# Patient Record
Sex: Female | Born: 1935
Health system: Southern US, Community
[De-identification: ages and names within clinical notes are randomized; demographics above are authoritative.]

## PROBLEM LIST (undated history)

## (undated) DIAGNOSIS — Z974 Presence of external hearing-aid: Secondary | ICD-10-CM

## (undated) DIAGNOSIS — J019 Acute sinusitis, unspecified: Secondary | ICD-10-CM

## (undated) DIAGNOSIS — M1990 Unspecified osteoarthritis, unspecified site: Secondary | ICD-10-CM

## (undated) DIAGNOSIS — I10 Essential (primary) hypertension: Secondary | ICD-10-CM

## (undated) DIAGNOSIS — H9190 Unspecified hearing loss, unspecified ear: Secondary | ICD-10-CM

## (undated) DIAGNOSIS — Z973 Presence of spectacles and contact lenses: Secondary | ICD-10-CM

## (undated) DIAGNOSIS — C50919 Malignant neoplasm of unspecified site of unspecified female breast: Secondary | ICD-10-CM

## (undated) HISTORY — PX: TONSILLECTOMY AND ADENOIDECTOMY: SHX28

## (undated) HISTORY — PX: COLONOSCOPY: SHX174

## (undated) HISTORY — DX: Acute sinusitis, unspecified: J01.90

## (undated) HISTORY — DX: Essential (primary) hypertension: I10

## (undated) HISTORY — DX: Malignant neoplasm of unspecified site of unspecified female breast: C50.919

## (undated) HISTORY — PX: HERNIA REPAIR: SHX51

---

## 2004-03-02 HISTORY — PX: APPENDECTOMY: SHX54

## 2010-03-02 HISTORY — PX: BREAST BIOPSY: SHX20

## 2011-03-03 HISTORY — PX: MASTECTOMY: SHX3

## 2011-12-07 LAB — HM MAMMOGRAPHY

## 2012-12-15 ENCOUNTER — Other Ambulatory Visit: Payer: Self-pay

## 2012-12-15 DIAGNOSIS — Z1231 Encounter for screening mammogram for malignant neoplasm of breast: Secondary | ICD-10-CM

## 2013-01-03 ENCOUNTER — Ambulatory Visit: Payer: Self-pay

## 2013-01-19 ENCOUNTER — Ambulatory Visit
Admission: RE | Admit: 2013-01-19 | Discharge: 2013-01-19 | Disposition: A | Discharge status: Home / Self Care | Payer: Medicare Other | Source: Ambulatory Visit

## 2013-01-19 DIAGNOSIS — Z1231 Encounter for screening mammogram for malignant neoplasm of breast: Secondary | ICD-10-CM

## 2013-01-19 LAB — HM MAMMOGRAPHY

## 2013-03-03 ENCOUNTER — Encounter: Payer: Self-pay | Admitting: Internal Medicine

## 2013-03-03 ENCOUNTER — Ambulatory Visit (INDEPENDENT_AMBULATORY_CARE_PROVIDER_SITE_OTHER): Payer: Medicare PPO | Admitting: Internal Medicine

## 2013-03-03 VITALS — BP 152/86 | Temp 98.5°F | Ht 62.5 in | Wt 167.0 lb

## 2013-03-03 DIAGNOSIS — I10 Essential (primary) hypertension: Secondary | ICD-10-CM

## 2013-03-03 DIAGNOSIS — C50919 Malignant neoplasm of unspecified site of unspecified female breast: Secondary | ICD-10-CM | POA: Insufficient documentation

## 2013-03-03 DIAGNOSIS — R252 Cramp and spasm: Secondary | ICD-10-CM

## 2013-03-03 DIAGNOSIS — Z9889 Other specified postprocedural states: Secondary | ICD-10-CM

## 2013-03-03 DIAGNOSIS — Z974 Presence of external hearing-aid: Secondary | ICD-10-CM | POA: Insufficient documentation

## 2013-03-03 DIAGNOSIS — C50912 Malignant neoplasm of unspecified site of left female breast: Secondary | ICD-10-CM

## 2013-03-03 DIAGNOSIS — Z853 Personal history of malignant neoplasm of breast: Secondary | ICD-10-CM

## 2013-03-03 DIAGNOSIS — Z2821 Immunization not carried out because of patient refusal: Secondary | ICD-10-CM

## 2013-03-03 LAB — BASIC METABOLIC PANEL
BUN: 24 mg/dL — ABNORMAL HIGH (ref 6–23)
CHLORIDE: 104 meq/L (ref 96–112)
CO2: 27 meq/L (ref 19–32)
Calcium: 9.7 mg/dL (ref 8.4–10.5)
Creatinine, Ser: 1 mg/dL (ref 0.4–1.2)
GFR: 53.88 mL/min — ABNORMAL LOW (ref >60.00)
Glucose, Bld: 109 mg/dL — ABNORMAL HIGH (ref 70–99)
Potassium: 5 mEq/L (ref 3.5–5.1)
SODIUM: 139 meq/L (ref 135–145)

## 2013-03-03 LAB — CBC WITH DIFFERENTIAL/PLATELET
Basophils Absolute: 0.1 10*3/uL (ref 0.0–0.1)
Basophils Relative: 0.5 % (ref 0.0–3.0)
Eosinophils Absolute: 0.5 10*3/uL (ref 0.0–0.7)
Eosinophils Relative: 4.6 % (ref 0.0–5.0)
HCT: 42.6 % (ref 36.0–46.0)
HEMOGLOBIN: 14.4 g/dL (ref 12.0–15.0)
LYMPHS PCT: 20.3 % (ref 12.0–46.0)
Lymphs Abs: 2.1 10*3/uL (ref 0.7–4.0)
MCHC: 33.8 g/dL (ref 30.0–36.0)
MCV: 96.9 fl (ref 78.0–100.0)
Monocytes Absolute: 0.9 10*3/uL (ref 0.1–1.0)
Monocytes Relative: 8.2 % (ref 3.0–12.0)
NEUTROS ABS: 6.9 10*3/uL (ref 1.4–7.7)
Neutrophils Relative %: 66.4 % (ref 43.0–77.0)
Platelets: 206 10*3/uL (ref 150.0–400.0)
RBC: 4.4 Mil/uL (ref 3.87–5.11)
RDW: 13 % (ref 11.5–14.6)
WBC: 10.4 10*3/uL (ref 4.5–10.5)

## 2013-03-03 LAB — T4, FREE: Free T4: 0.79 ng/dL (ref 0.60–1.60)

## 2013-03-03 LAB — MAGNESIUM: MAGNESIUM: 2.4 mg/dL (ref 1.5–2.5)

## 2013-03-03 LAB — TSH: TSH: 0.27 u[IU]/mL — AB (ref 0.35–5.50)

## 2013-03-03 NOTE — Progress Notes (Signed)
Pre visit review using our clinic review tool, if applicable. No additional management support is needed unless otherwise documented below in the visit note. Chief Complaint  Patient presents with  . Establish Care    HPI:  Patient comes in as new patient visit  Here with husband .[name] Previous care was  In [place] when they moved ; Moved from [facility]     september   .   2014. Lived [place] previous,ly for years.  Here with adopted daughter   .  Medical issues :  HT :30 year years  On atenolol Medication   for years and lisinopril .more recnetly  No recent check .Last blood tests .  Every 3 months  Had blood tests. In [place] last ? 6 month ago .   Used to exercise  curves ;not since moved.   Using a vitamin.   Felt to be good for immunity and cancer    No planned fu for her breast cancer  Known and no meds   Had left mastectomy .    Has has neg mammo gram.  Right .    New issues :cramps in legs and can last up to 30 minutes    And rushing  felling and lightheaded  At times. Better with exercise  Sometimes at night  No claudication no diuretics    One had cancer  Lung  Tobacco and now deceased twois sister had ;lumpectomy .    Mom died from cva and heart failure .     Adopted daughter.  barbara ROS:  GEN/ HEENT: No fever, significant weight changes sweats headaches vision problems has hearing aids  And hx of wax in ear  But doesn't do wll with drops or irrigation CV/ PULM; No chest pain shortness of breath cough, syncope,edema  change in exercise tolerance. GI /GU: No adominal pain, vomiting, change in bowel habits. No blood in the stool. No significant GU symptoms. SKIN/HEME: ,no acute skin rashes suspicious lesions or bleeding. No lymphadenopathy, nodules, masses.  NEURO/ PSYCH:  No neurologic signs such as weakness numbness. No depression anxiety. IMM/ Allergy: No unusual infections.  Allergy .   REST of 12 system review negative except as per HPI   Past Medical History  Diagnosis  Date  . Breast cancer     mastectomy 3   neg ln     Family History  Problem Relation Age of Onset  . Hypertension Mother   . Stroke Mother   . Heart failure Mother   . Cancer Sister     lung     History   Social History  . Marital Status: Married    Spouse Name: N/A    Number of Children: N/A  . Years of Education: N/A   Social History Main Topics  . Smoking status: Never Smoker   . Smokeless tobacco: None  . Alcohol Use: Yes     Comment: moderate  . Drug Use: None  . Sexual Activity: None   Other Topics Concern  . None   Social History Narrative   Usually receives 5 hours of sleep per night   3 people living in the home   [place]uppy  [place]   From [place] moved from [place]  Adopted daughter nearby.      Neg ets  Wine with meals  No tob rd.   G0P0   Married husband [place] background  Herself [place] descent.  Outpatient Encounter Prescriptions as of 03/03/2013  Medication Sig  . atenolol (TENORMIN) 50 MG tablet Take 50 mg by mouth daily.  [name] CALCIUM-MAG-VIT C-VIT D PO Take by mouth daily.  . Coenzyme Q10 (CO Q 10 PO) Take 300 mg by mouth 3 (three) times daily.  [name] lisinopril (PRINIVIL,ZESTRIL) 40 MG tablet Take 40 mg by mouth daily.  . vitamin C (ASCORBIC ACID) 500 MG tablet Take 500 mg by mouth 3 (three) times daily.    EXAM:  BP 152/86  Temp(Src) 98.5 F (36.9 C) (Oral)  Ht 5' 2.5" (1.588 m)  Wt 167 lb (75.751 kg)  BMI 30.04 kg/m2  Body mass index is 30.04 kg/(m^2). Here with husband  GENERAL: vitals reviewed and listed above, alert, oriented, appears well hydrated and in no acute distress hard of hearing  HEENT: atraumatic, conjunctiva  clear, no obvious abnormalities on inspection of external nose and ears  tms nl right left some wax brwon in canal OP : no lesion edema or exudate  NECK: no obvious masses on inspection palpation  No adenopathy or jvd  LUNGS: clear to auscultation bilaterally, no wheezes, rales or rhonchi, good  air movement CV: HRRR, no clubbing cyanosis or  peripheral edema nl cap refill  Has vv superficial no ulcers  Pulses nl in feet  Slight hyperemia of distal large toe no bruits heard  Abdomen:  Sof,t normal bowel sounds without hepatosplenomegaly, no guarding rebound or masses no CVA tenderness Neuro grosslu non focal  MS: moves all extremities without noticeable focal  abnormality PSYCH: pleasant and cooperative, no obvious depression or anxiety  ASSESSMENT AND PLAN:  Discussed the following assessment and plan:  Leg cramps - problem  - Plan: HM MAMMOGRAPHY, vitamin C (ASCORBIC ACID) 500 MG tablet, Coenzyme Q10 (CO Q 10 PO), CALCIUM-MAG-VIT C-VIT D PO, atenolol (TENORMIN) 50 MG tablet, lisinopril (PRINIVIL,ZESTRIL) 40 MG tablet, HM MAMMOGRAPHY, Basic metabolic panel, CBC with Differential, TSH, T4, free, Magnesium  Unspecified essential hypertension - on meds for 30 years  sysolic up today will fu and bring in own machine. - Plan: HM MAMMOGRAPHY, vitamin C (ASCORBIC ACID) 500 MG tablet, Coenzyme Q10 (CO Q 10 PO), CALCIUM-MAG-VIT C-VIT D PO, atenolol (TENORMIN) 50 MG tablet, lisinopril (PRINIVIL,ZESTRIL) 40 MG tablet, HM MAMMOGRAPHY, Basic metabolic panel, CBC with Differential, TSH, T4, free, Magnesium  HX: breast cancer - uncertain fu needed on no meds  - Plan: HM MAMMOGRAPHY, vitamin C (ASCORBIC ACID) 500 MG tablet, Coenzyme Q10 (CO Q 10 PO), CALCIUM-MAG-VIT C-VIT D PO, atenolol (TENORMIN) 50 MG tablet, lisinopril (PRINIVIL,ZESTRIL) 40 MG tablet, HM MAMMOGRAPHY, Basic metabolic panel, CBC with Differential, TSH, T4, free, Magnesium  Wears hearing aid  Influenza vaccination declined by patient  Pneumococcal vaccination declined by patient  Breast cancer, left Disc getting record and apparently release has been signed . Have pt check with MR staff etc . dont have records.   wax in eac doesn't seem impacted   Pt says irrigations are bothersome  .    Would have to make a separate app for   Provider   To remove wax or see ent   Risk benefit of immunization.  discussed. -Patient advised to return or notify health care team  if symptoms worsen or persist or new concerns arise.  Patient Instructions  Your blood pressure is elevated today   Check readings  5 days a week and   ROV in  1-2 months with the machine and readings  . Decide on  Medication adjustment also .  Your left ear has some wax in the canal   Will notify you  of labs when available. Increase walking activity in the short run and see if it helps the cramps.   Will ask our medical recordes about records but I dont have these at this point.   Standley Brooking. Panosh M.D.   Prolonged visit   Counseled. Check into records  Total visit 81mins > 50% spent counseling and coordinating care

## 2013-03-03 NOTE — Patient Instructions (Addendum)
Your blood pressure is elevated today   Check readings  5 days a week and   ROV in  1-2 months with the machine and readings  . Decide on  Medication adjustment also .  Your left ear has some wax in the canal   Will notify you  of labs when available. Increase walking activity in the short run and see if it helps the cramps.   Will ask our medical recordes about records but I dont have these at this point.

## 2013-03-13 ENCOUNTER — Telehealth: Payer: Self-pay | Admitting: Internal Medicine

## 2013-03-13 NOTE — Telephone Encounter (Signed)
Pt was told to have labs done before her next visit on 02/13, so I scheduled her for 02/06 and advised her to come in fasting because I had no paperwork (only a written note) with what kind of labs were being done and she wasn't sure. Just wanted to let you know she has been scheduled and orders aren't there. Thanks!

## 2013-03-13 NOTE — Progress Notes (Signed)
Patient came by the office. Gave her test results.  Sent her to the front desk to schedule lab appt one week before she is scheduled to come in.

## 2013-03-29 ENCOUNTER — Ambulatory Visit: Payer: Self-pay | Admitting: Internal Medicine

## 2013-04-07 ENCOUNTER — Other Ambulatory Visit (INDEPENDENT_AMBULATORY_CARE_PROVIDER_SITE_OTHER): Payer: Medicare PPO

## 2013-04-07 DIAGNOSIS — R739 Hyperglycemia, unspecified: Secondary | ICD-10-CM

## 2013-04-07 DIAGNOSIS — E0590 Thyrotoxicosis, unspecified without thyrotoxic crisis or storm: Secondary | ICD-10-CM

## 2013-04-07 DIAGNOSIS — R7309 Other abnormal glucose: Secondary | ICD-10-CM

## 2013-04-07 LAB — T3, FREE: T3, Free: 2.8 pg/mL (ref 2.3–4.2)

## 2013-04-07 LAB — T4, FREE: Free T4: 0.86 ng/dL (ref 0.60–1.60)

## 2013-04-07 LAB — TSH: TSH: 1.34 u[IU]/mL (ref 0.35–5.50)

## 2013-04-07 LAB — HEMOGLOBIN A1C: HEMOGLOBIN A1C: 6 % (ref 4.6–6.5)

## 2013-04-11 ENCOUNTER — Ambulatory Visit (INDEPENDENT_AMBULATORY_CARE_PROVIDER_SITE_OTHER): Payer: Medicare PPO | Admitting: Family Medicine

## 2013-04-11 ENCOUNTER — Encounter: Payer: Self-pay | Admitting: Family Medicine

## 2013-04-11 VITALS — BP 158/84 | Temp 98.7°F | Ht 62.5 in | Wt 166.0 lb

## 2013-04-11 DIAGNOSIS — M545 Low back pain, unspecified: Secondary | ICD-10-CM

## 2013-04-11 MED ORDER — CYCLOBENZAPRINE HCL 10 MG PO TABS: 10.0000 mg | ORAL_TABLET | Freq: Three times a day (TID) | ORAL | Status: DC | PRN

## 2013-04-11 MED ORDER — DICLOFENAC SODIUM 75 MG PO TBEC
75.0000 mg | DELAYED_RELEASE_TABLET | Freq: Two times a day (BID) | ORAL | Status: DC | PRN
Start: 2013-04-11 — End: 2013-08-14

## 2013-04-11 NOTE — Progress Notes (Signed)
Pre visit review using our clinic review tool, if applicable. No additional management support is needed unless otherwise documented below in the visit note. 

## 2013-04-11 NOTE — Progress Notes (Signed)
   Subjective:    Patient ID: Julie Boone, female    DOB: 09/12/1924, 80 y.o.   MRN: [id number]  HPI Here for 10 days of sharp intermittent pains in the lower back. She does not have a hx of back problems. No recent trauma. She and her husband recently got a puppy and now she has to stoop over numerous times a day to pick the puppy up, etc. The pain hits her when she turns her body from side to side, and she feels well if she is sitting still. No bowel or urinary sx. Using heat and some aspirin.    Review of Systems  Constitutional: Negative.   Gastrointestinal: Negative.   Genitourinary: Negative.   Musculoskeletal: Positive for back pain.       Objective:   Physical Exam  Constitutional: She appears well-developed and well-nourished.  Musculoskeletal:  She has a lot of spasm of the paraspinal muscles in the right lower back. Tender over the right lower back but not over the spine. Full ROM           Assessment & Plan:  Back spasms, possibly from a pinched nerve. Rest and use heat. Try Diclofenac and Flexeril. Recheck prn

## 2013-04-14 ENCOUNTER — Encounter: Payer: Self-pay | Admitting: Internal Medicine

## 2013-04-14 ENCOUNTER — Ambulatory Visit (INDEPENDENT_AMBULATORY_CARE_PROVIDER_SITE_OTHER): Payer: Medicare PPO | Admitting: Internal Medicine

## 2013-04-14 VITALS — BP 146/70 | HR 67 | Temp 98.4°F | Ht 62.5 in | Wt 168.0 lb

## 2013-04-14 DIAGNOSIS — R739 Hyperglycemia, unspecified: Secondary | ICD-10-CM | POA: Insufficient documentation

## 2013-04-14 DIAGNOSIS — I10 Essential (primary) hypertension: Secondary | ICD-10-CM

## 2013-04-14 DIAGNOSIS — R946 Abnormal results of thyroid function studies: Secondary | ICD-10-CM

## 2013-04-14 DIAGNOSIS — R7989 Other specified abnormal findings of blood chemistry: Secondary | ICD-10-CM | POA: Insufficient documentation

## 2013-04-14 DIAGNOSIS — C50919 Malignant neoplasm of unspecified site of unspecified female breast: Secondary | ICD-10-CM

## 2013-04-14 DIAGNOSIS — H9190 Unspecified hearing loss, unspecified ear: Secondary | ICD-10-CM

## 2013-04-14 DIAGNOSIS — R7309 Other abnormal glucose: Secondary | ICD-10-CM

## 2013-04-14 NOTE — Progress Notes (Signed)
Chief Complaint  Patient presents with  . Follow-up    HPI:  FU bp elevateion   Thyroid and  bg results  Here with husband today.  Saw dr Sarajane Jews this week for back pain and put on nsaid and musc relaxant. Doing much better. Given Voltaren.adn flexeril happened lifting puppy.  saw ent   Wax out  Of.   Was dissapointed  At the situation not explaining still decreased hearing supposed to go back in 3 months but probably will not go back. Still has her hearing aids doesn't hear well and left year  Blood pressure brings in some readings and also the machine that she uses. In January and number the readings were in the 150s and 160s however they have come down to the 130s and 140s over normal diastolics. The last 2 days 145 her 72 138/72.  Is continuing on both medications lisinopril and atenolol. Aches blood pressure readings in her right arm because of her history of left mastectomy ROS: See pertinent positives and negatives per HPI.  Past Medical History  Diagnosis Date  . Breast cancer     mastectomy 3   neg ln     Family History  Problem Relation Age of Onset  . Hypertension Mother   . Stroke Mother   . Heart failure Mother   . Cancer Sister     lung     History   Social History  . Marital Status: Married    Spouse Name: N/A    Number of Children: N/A  . Years of Education: N/A   Social History Main Topics  . Smoking status: Never Smoker   . Smokeless tobacco: Never Used  . Alcohol Use: Yes     Comment: occ  . Drug Use: No  . Sexual Activity: None   Other Topics Concern  . None   Social History Narrative   Usually receives 5 hours of sleep per night   3 people living in the home   [place]   From [place] moved from [place]  Adopted daughter nearby.      Neg ets  Wine with meals  No tob rd.   G0P0   Married husband [place] background  H[place]elf [place] descent.             Outpatient Encounter Prescriptions as of 04/14/2013  Medication  Sig  . atenolol (TENORMIN) 50 MG tablet Take 50 mg by mouth daily.  [name] CALCIUM-MAG-VIT C-VIT D PO Take by mouth daily.  . Coenzyme Q10 (CO Q 10 PO) Take 300 mg by mouth 3 (three) times daily.  . cyclobenzaprine (FLEXERIL) 10 MG tablet Take 1 tablet (10 mg total) by mouth 3 (three) times daily as needed for muscle spasms.  . diclofenac (VOLTAREN) 75 MG EC tablet Take 1 tablet (75 mg total) by mouth 2 (two) times daily as needed for moderate pain.  [name] lisinopril (PRINIVIL,ZESTRIL) 40 MG tablet Take 40 mg by mouth daily.  . vitamin C (ASCORBIC ACID) 500 MG tablet Take 500 mg by mouth 3 (three) times daily.    EXAM:  BP 146/70  Pulse 67  Temp(Src) 98.4 F (36.9 C) (Oral)  Ht 5' 2.5" (1.588 m)  Wt 168 lb (76.204 kg)  BMI 30.22 kg/m2  SpO2 98%  Body mass index is 30.22 kg/(m^2). 178/88 right  154/70 office cuff. Repeat her machine 157/80 right arm readjusted proper position. GENERAL: vitals reviewed and listed above, alert, oriented, appears well hydrated  and in no acute distress here with husband HEENT: atraumatic, conjunctiva  clear, no obvious abnormalities on inspection of external nose and ears  Ear tms clear no wax in left min in right  NECK: no obvious masses on inspection palpation  CV: HRRR, no clubbing cyanosis or  peripheral edema nl cap refill  MS: moves all extremities without noticeable focal  abnormality PSYCH: pleasant and cooperative, no obvious depression or anxiety  ASSESSMENT AND PLAN:  Discussed the following assessment and plan:  Unspecified essential hypertension - lower at home  moslty controlled a few readings elevated 150 160 we'll intensify lifestyle intervention monitor If elevated add low-dose diuretic or ccb  Elevated blood sugar - No diabetes  Abnormal thyroid blood test - Better recheck periodically no treatment  Breast cancer  Decreased hearing - left more than right  not from wax impaction Diclofenac can also raise bp caution  Discussed . Risk  benefit  -Patient advised to return or notify health care team  if symptoms worsen or persist or new concerns arise. Had prolonged disc lsi in addition to meds  As eating and activity is not as good since they moved from [place]. Discussed DASH diet etc.      Patient Instructions  Continue   Medication   For BP    Mediterranean and or dash  Diet .helpful for blood pressure.   Goal is below 150/90    Check readings about 3 x per week.  If getting elevated readings we will add medication as discussed .  Thyroid and blood sugar are  better.    DASH Diet The DASH diet stands for "Dietary Approaches to Stop Hypertension." It is a healthy eating plan that has been shown to reduce high blood pressure (hypertension) in as little as 14 days, while also possibly providing other significant health benefits. These other health benefits include reducing the risk of breast cancer after menopause and reducing the risk of type 2 diabetes, heart disease, colon cancer, and stroke. Health benefits also include weight loss and slowing kidney failure in patients with chronic kidney disease.  DIET GUIDELINES  Limit salt (sodium). Your diet should contain less than 1500 mg of sodium daily.  Limit refined or processed carbohydrates. Your diet should include mostly whole grains. Desserts and added sugars should be used sparingly.  Include small amounts of heart-healthy fats. These types of fats include nuts, oils, and tub margarine. Limit saturated and trans fats. These fats have been shown to be harmful in the body. CHOOSING FOODS  The following food groups are based on a 2000 calorie diet. See your Registered Dietitian for individual calorie needs. Grains and Grain Products (6 to 8 servings daily)  Eat More Often: Whole-wheat bread, brown rice, whole-grain or wheat pasta, quinoa, popcorn without added fat or salt (air popped).  Eat Less Often: White bread, white pasta, white rice, cornbread. Vegetables (4 to  5 servings daily)  Eat More Often: Fresh, frozen, and canned vegetables. Vegetables may be raw, steamed, roasted, or grilled with a minimal amount of fat.  Eat Less Often/Avoid: Creamed or fried vegetables. Vegetables in a cheese sauce. Fruit (4 to 5 servings daily)  Eat More Often: All fresh, canned (in natural juice), or frozen fruits. Dried fruits without added sugar. One hundred percent fruit juice ( cup [237 mL] daily).  Eat Less Often: Dried fruits with added sugar. Canned fruit in light or heavy syrup. [employer], Fish, and Poultry (2 servings or less daily. One serving is  3 to 4 oz [85-114 g]).  Eat More Often: Ninety percent or leaner ground beef, tenderloin, sirloin. Round cuts of beef, chicken breast, [place] breast. All fish. Grill, bake, or broil your meat. Nothing should be fried.  Eat Less Often/Avoid: Fatty cuts of meat, [place], or chicken leg, thigh, or wing. Fried cuts of meat or fish. Dairy (2 to 3 servings)  Eat More Often: Low-fat or fat-free milk, low-fat plain or light yogurt, reduced-fat or part-skim cheese.  Eat Less Often/Avoid: Milk (whole, 2%).Whole milk yogurt. Full-fat cheeses. Nuts, Seeds, and Legumes (4 to 5 servings per week)  Eat More Often: All without added salt.  Eat Less Often/Avoid: Salted nuts and seeds, canned beans with added salt. Fats and Sweets (limited)  Eat More Often: Vegetable oils, tub margarines without trans fats, sugar-free gelatin. Mayonnaise and salad dressings.  Eat Less Often/Avoid: Coconut oils, palm oils, butter, stick margarine, cream, half and half, cookies, candy, pie. FOR MORE INFORMATION The Dash Diet Eating Plan: www.dashdiet.org Document Released: 02/05/2011 Document Revised: 05/11/2011 Document Reviewed: 02/05/2011 [facility] Patient Information 2014 [place], [place].    Standley Brooking. Acire Tang M.D.  Pre visit review using our clinic review tool, if applicable. No additional management support is needed unless otherwise  documented below in the visit note. Total visit 12mins > 50% spent counseling and coordinating care

## 2013-04-14 NOTE — Patient Instructions (Addendum)
Continue   Medication   For BP    Mediterranean and or dash  Diet .helpful for blood pressure.   Goal is below 150/90    Check readings about 3 x per week.  If getting elevated readings we will add medication as discussed .  Thyroid and blood sugar are  better.    DASH Diet The DASH diet stands for "Dietary Approaches to Stop Hypertension." It is a healthy eating plan that has been shown to reduce high blood pressure (hypertension) in as little as 14 days, while also possibly providing other significant health benefits. These other health benefits include reducing the risk of breast cancer after menopause and reducing the risk of type 2 diabetes, heart disease, colon cancer, and stroke. Health benefits also include weight loss and slowing kidney failure in patients with chronic kidney disease.  DIET GUIDELINES  Limit salt (sodium). Your diet should contain less than 1500 mg of sodium daily.  Limit refined or processed carbohydrates. Your diet should include mostly whole grains. Desserts and added sugars should be used sparingly.  Include small amounts of heart-healthy fats. These types of fats include nuts, oils, and tub margarine. Limit saturated and trans fats. These fats have been shown to be harmful in the body. CHOOSING FOODS  The following food groups are based on a 2000 calorie diet. See your Registered Dietitian for individual calorie needs. Grains and Grain Products (6 to 8 servings daily)  Eat More Often: Whole-wheat bread, brown rice, whole-grain or wheat pasta, quinoa, popcorn without added fat or salt (air popped).  Eat Less Often: White bread, white pasta, white rice, cornbread. Vegetables (4 to 5 servings daily)  Eat More Often: Fresh, frozen, and canned vegetables. Vegetables may be raw, steamed, roasted, or grilled with a minimal amount of fat.  Eat Less Often/Avoid: Creamed or fried vegetables. Vegetables in a cheese sauce. Fruit (4 to 5 servings daily)  Eat More  Often: All fresh, canned (in natural juice), or frozen fruits. Dried fruits without added sugar. One hundred percent fruit juice ( cup [237 mL] daily).  Eat Less Often: Dried fruits with added sugar. Canned fruit in light or heavy syrup. [employer]UM[employer], Fish, and Poultry (2 servings or less daily. One serving is 3 to 4 oz [85-114 g]).  Eat More Often: Ninety percent or leaner ground beef, tenderloin, sirloin. Round cuts of beef, chicken breast, [place] breast. All fish. Grill, bake, or broil your meat. Nothing should be fried.  Eat Less Often/Avoid: Fatty cuts of meat, [place], or chicken leg, thigh, or wing. Fried cuts of meat or fish. Dairy (2 to 3 servings)  Eat More Often: Low-fat or fat-free milk, low-fat plain or light yogurt, reduced-fat or part-skim cheese.  Eat Less Often/Avoid: Milk (whole, 2%).Whole milk yogurt. Full-fat cheeses. Nuts, Seeds, and Legumes (4 to 5 servings per week)  Eat More Often: All without added salt.  Eat Less Often/Avoid: Salted nuts and seeds, canned beans with added salt. Fats and Sweets (limited)  Eat More Often: Vegetable oils, tub margarines without trans fats, sugar-free gelatin. Mayonnaise and salad dressings.  Eat Less Often/Avoid: Coconut oils, palm oils, butter, stick margarine, cream, half and half, cookies, candy, pie. FOR MORE INFORMATION The Dash Diet Eating Plan: www.dashdiet.org Document Released: 02/05/2011 Document Revised: 05/11/2011 Document Reviewed: 02/05/2011 [facility] Patient Information 2014 [place], [place].

## 2013-04-17 ENCOUNTER — Telehealth: Payer: Self-pay | Admitting: Internal Medicine

## 2013-04-17 NOTE — Telephone Encounter (Signed)
Relevant patient education mailed to patient.  

## 2013-05-11 ENCOUNTER — Telehealth: Payer: Self-pay | Admitting: Internal Medicine

## 2013-05-11 DIAGNOSIS — R252 Cramp and spasm: Secondary | ICD-10-CM

## 2013-05-11 DIAGNOSIS — Z853 Personal history of malignant neoplasm of breast: Secondary | ICD-10-CM

## 2013-05-11 DIAGNOSIS — I10 Essential (primary) hypertension: Secondary | ICD-10-CM

## 2013-05-11 NOTE — Telephone Encounter (Signed)
Patient is requesting a refill on lisinopril (PRINIVIL,ZESTRIL) 40 MG tablet. She wants it sent to [facility] [phone]. She wants a call after this has been taken care of. Her contact number has been verified.

## 2013-05-12 MED ORDER — LISINOPRIL 40 MG PO TABS: 40.0000 mg | ORAL_TABLET | Freq: Every day | ORAL | Status: DC

## 2013-05-12 NOTE — Telephone Encounter (Signed)
Sent by [facility]. Left a message on home answering machine informing the patient that her prescription has been sent to the pharmacy.

## 2013-06-23 ENCOUNTER — Encounter: Payer: Self-pay | Admitting: Internal Medicine

## 2013-06-23 ENCOUNTER — Ambulatory Visit (INDEPENDENT_AMBULATORY_CARE_PROVIDER_SITE_OTHER): Payer: Commercial Managed Care - HMO | Admitting: Internal Medicine

## 2013-06-23 VITALS — BP 150/88 | HR 79 | Ht 62.5 in | Wt 167.0 lb

## 2013-06-23 DIAGNOSIS — J019 Acute sinusitis, unspecified: Secondary | ICD-10-CM

## 2013-06-23 DIAGNOSIS — H9190 Unspecified hearing loss, unspecified ear: Secondary | ICD-10-CM

## 2013-06-23 DIAGNOSIS — H6120 Impacted cerumen, unspecified ear: Secondary | ICD-10-CM

## 2013-06-23 DIAGNOSIS — J309 Allergic rhinitis, unspecified: Secondary | ICD-10-CM

## 2013-06-23 HISTORY — DX: Acute sinusitis, unspecified: J01.90

## 2013-06-23 MED ORDER — AMOXICILLIN 500 MG PO CAPS: 500.0000 mg | ORAL_CAPSULE | Freq: Three times a day (TID) | ORAL | Status: DC

## 2013-06-23 NOTE — Patient Instructions (Signed)
This seems like allergy and sinusitis ontoip of it. Amoxicillin .   also add antihistamine such as otc claritin  Plain generic loratidine Also daily nasal cortisone  Spray each day while in the spring allergy season.  Expect improvement in the next 5 days  . Contact us if  Not improved  .    Sinusitis Sinusitis is redness, soreness, and swelling (inflammation) of the paranasal sinuses. Paranasal sinuses are air pockets within the bones of your face (beneath the eyes, the middle of the forehead, or above the eyes). In healthy paranasal sinuses, mucus is able to drain out, and air is able to circulate through them by way of your nose. However, when your paranasal sinuses are inflamed, mucus and air can become trapped. This can allow bacteria and other germs to grow and cause infection. Sinusitis can develop quickly and last only a short time (acute) or continue over a long period (chronic). Sinusitis that lasts for more than 12 weeks is considered chronic.  CAUSES  Causes of sinusitis include:  Allergies.  Structural abnormalities, such as displacement of the cartilage that separates your nostrils (deviated septum), which can decrease the air flow through your nose and sinuses and affect sinus drainage.  Functional abnormalities, such as when the small hairs (cilia) that line your sinuses and help remove mucus do not work properly or are not present. SYMPTOMS  Symptoms of acute and chronic sinusitis are the same. The primary symptoms are pain and pressure around the affected sinuses. Other symptoms include:  Upper toothache.  Earache.  Headache.  Bad breath.  Decreased sense of smell and taste.  A cough, which worsens when you are lying flat.  Fatigue.  Fever.  Thick drainage from your nose, which often is green and may contain pus (purulent).  Swelling and warmth over the affected sinuses. DIAGNOSIS  Your caregiver will perform a physical exam. During the exam, your  caregiver may:  Look in your nose for signs of abnormal growths in your nostrils (nasal polyps).  Tap over the affected sinus to check for signs of infection.  View the inside of your sinuses (endoscopy) with a special imaging device with a light attached (endoscope), which is inserted into your sinuses. If your caregiver suspects that you have chronic sinusitis, one or more of the following tests may be recommended:  Allergy tests.  Nasal culture A sample of mucus is taken from your nose and sent to a lab and screened for bacteria.  Nasal cytology A sample of mucus is taken from your nose and examined by your caregiver to determine if your sinusitis is related to an allergy. TREATMENT  Most cases of acute sinusitis are related to a viral infection and will resolve on their own within 10 days. Sometimes medicines are prescribed to help relieve symptoms (pain medicine, decongestants, nasal steroid sprays, or saline sprays).  However, for sinusitis related to a bacterial infection, your caregiver will prescribe antibiotic medicines. These are medicines that will help kill the bacteria causing the infection.  Rarely, sinusitis is caused by a fungal infection. In theses cases, your caregiver will prescribe antifungal medicine. For some cases of chronic sinusitis, surgery is needed. Generally, these are cases in which sinusitis recurs more than 3 times per year, despite other treatments. HOME CARE INSTRUCTIONS   Drink plenty of water. Water helps thin the mucus so your sinuses can drain more easily.  Use a humidifier.  Inhale steam 3 to 4 times a day (for example, sit in  the bathroom with the shower running).  Apply a warm, moist washcloth to your face 3 to 4 times a day, or as directed by your caregiver.  Use saline nasal sprays to help moisten and clean your sinuses.  Take over-the-counter or prescription medicines for pain, discomfort, or fever only as directed by your caregiver. SEEK  IMMEDIATE MEDICAL CARE IF:  You have increasing pain or severe headaches.  You have nausea, vomiting, or drowsiness.  You have swelling around your face.  You have vision problems.  You have a stiff neck.  You have difficulty breathing. MAKE SURE YOU:   Understand these instructions.  Will watch your condition.  Will get help right away if you are not doing well or get worse. Document Released: 02/16/2005 Document Revised: 05/11/2011 Document Reviewed: 03/03/2011 [facility] Patient Information 2014 [place], [place].    Allergic Rhinitis Allergic rhinitis is when the mucous membranes in the nose respond to allergens. Allergens are particles in the air that cause your body to have an allergic reaction. This causes you to release allergic antibodies. Through a chain of events, these eventually cause you to release histamine into the blood stream. Although meant to protect the body, it is this release of histamine that causes your discomfort, such as frequent sneezing, congestion, and an itchy, runny nose.  CAUSES  Seasonal allergic rhinitis (hay fever) is caused by pollen allergens that may come from grasses, trees, and weeds. Year-round allergic rhinitis (perennial allergic rhinitis) is caused by allergens such as house dust mites, pet dander, and mold spores.  SYMPTOMS   Nasal stuffiness (congestion).  Itchy, runny nose with sneezing and tearing of the eyes. DIAGNOSIS  Your health care provider can help you determine the allergen or allergens that trigger your symptoms. If you and your health care provider are unable to determine the allergen, skin or blood testing may be used. TREATMENT  Allergic Rhinitis does not have a cure, but it can be controlled by:  Medicines and allergy shots (immunotherapy).  Avoiding the allergen. Hay fever may often be treated with antihistamines in pill or nasal spray forms. Antihistamines block the effects of histamine. There are over-the-counter  medicines that may help with nasal congestion and swelling around the eyes. Check with your health care provider before taking or giving this medicine.  If avoiding the allergen or the medicine prescribed do not work, there are many new medicines your health care provider can prescribe. Stronger medicine may be used if initial measures are ineffective. Desensitizing injections can be used if medicine and avoidance does not work. Desensitization is when a patient is given ongoing shots until the body becomes less sensitive to the allergen. Make sure you follow up with your health care provider if problems continue. HOME CARE INSTRUCTIONS It is not possible to completely avoid allergens, but you can reduce your symptoms by taking steps to limit your exposure to them. It helps to know exactly what you are allergic to so that you can avoid your specific triggers. SEEK MEDICAL CARE IF:   You have a fever.  You develop a cough that does not stop easily (persistent).  You have shortness of breath.  You start wheezing.  Symptoms interfere with normal daily activities. Document Released: 11/11/2000 Document Revised: 12/07/2012 Document Reviewed: 10/24/2012 [facility] Patient Information 2014 [place].

## 2013-06-23 NOTE — Progress Notes (Signed)
Pre visit review using our clinic review tool, if applicable. No additional management support is needed unless otherwise documented below in the visit note.   Chief Complaint  Patient presents with  . Cough  . Congestion    HPI: Patient comes in today for SDA for  new problem evaluation. Very stuffy   And hard to hear even with hearing aids.    Runny nose and has coughing nausea in am.    Eye itching and irritated  Ear aches .  Infection is sinus  Last tooth ache.  Right advised root canal and said on x ray had sinu infection but didn't tell her to seek other care. No fver  Having has off and no  sneezing itching eyes   First spring in [place] ( moived from [facility])   Dec hearing worse  ? If has wasx and can we flush it out?  ROS: See pertinent positives and negatives per HPI.  Past Medical History  Diagnosis Date  . Breast cancer     mastectomy 3   neg ln     Family History  Problem Relation Age of Onset  . Hypertension Mother   . Stroke Mother   . Heart failure Mother   . Cancer Sister     lung     History   Social History  . Marital Status: Married    Spouse Name: N/A    Number of Children: N/A  . Years of Education: N/A   Social History Main Topics  . Smoking status: Never Smoker   . Smokeless tobacco: Never Used  . Alcohol Use: Yes     Comment: occ  . Drug Use: No  . Sexual Activity: None   Other Topics Concern  . None   Social History Narrative   Usually receives 5 hours of sleep per night   3 people living in the home   [place]   From [place] moved from [place]  Adopted daughter nearby.      Neg ets  Wine with meals  No tob rd.   G0P0   Married husband [place] background  [place] descent.             Outpatient Encounter Prescriptions as of 06/23/2013  Medication Sig  . atenolol (TENORMIN) 50 MG tablet Take 50 mg by mouth daily.  [name] CALCIUM-MAG-VIT C-VIT D PO Take by mouth daily.  . Coenzyme Q10 (CO Q 10 PO) Take 300 mg  by mouth 3 (three) times daily.  . cyclobenzaprine (FLEXERIL) 10 MG tablet Take 1 tablet (10 mg total) by mouth 3 (three) times daily as needed for muscle spasms.  . diclofenac (VOLTAREN) 75 MG EC tablet Take 1 tablet (75 mg total) by mouth 2 (two) times daily as needed for moderate pain.  [name] lisinopril (PRINIVIL,ZESTRIL) 40 MG tablet Take 1 tablet (40 mg total) by mouth daily.  . vitamin C (ASCORBIC ACID) 500 MG tablet Take 500 mg by mouth 3 (three) times daily.  [name] amoxicillin (AMOXIL) 500 MG capsule Take 1 capsule (500 mg total) by mouth 3 (three) times daily. For sinusitis    EXAM:  BP 150/88  Pulse 79  Ht 5' 2.5" (1.588 m)  Wt 167 lb (75.751 kg)  BMI 30.04 kg/m2  SpO2 98%  Body mass index is 30.04 kg/(m^2).  GENERAL: vitals reviewed and listed above, alert, oriented, appears well hydrated and in no acute distress HEENT: atraumatic, conjunctiva  clear, no obvious abnormalities on  inspection of external nose and ears congested  Mod tender right face  Eyes clear  1+ wax in eacs  tms partly viewed and grey no redness  After irrigation better hearing and  Tm intact.  OP : no lesion edema or exudate  NECK: no obvious masses on inspection palpation  LUNGS: clear to auscultation bilaterally, no wheezes, rales or rhonchi,  CV: HRRR, no clubbing cyanosis or  peripheral edema nl cap refill  MS: moves all extremities without noticeable focal  Abnormality   ASSESSMENT AND PLAN:  Discussed the following assessment and plan:  Acute sinusitis with symptoms greater than 10 days  Decreased hearing  Allergic rhinitis  Wax in ear - impreoved after irrigation.   -Patient advised to return or notify health care team  if symptoms worsen ,persist or new concerns arise.  Patient Instructions  This seems like allergy and sinusitis ontoip of it. Amoxicillin .   also add antihistamine such as otc claritin  Plain generic loratidine Also daily nasal cortisone  Spray each day while in the spring  allergy season.  Expect improvement in the next 5 days  . Contact us if  Not improved  .    Sinusitis Sinusitis is redness, soreness, and swelling (inflammation) of the paranasal sinuses. Paranasal sinuses are air pockets within the bones of your face (beneath the eyes, the middle of the forehead, or above the eyes). In healthy paranasal sinuses, mucus is able to drain out, and air is able to circulate through them by way of your nose. However, when your paranasal sinuses are inflamed, mucus and air can become trapped. This can allow bacteria and other germs to grow and cause infection. Sinusitis can develop quickly and last only a short time (acute) or continue over a long period (chronic). Sinusitis that lasts for more than 12 weeks is considered chronic.  CAUSES  Causes of sinusitis include:  Allergies.  Structural abnormalities, such as displacement of the cartilage that separates your nostrils (deviated septum), which can decrease the air flow through your nose and sinuses and affect sinus drainage.  Functional abnormalities, such as when the small hairs (cilia) that line your sinuses and help remove mucus do not work properly or are not present. SYMPTOMS  Symptoms of acute and chronic sinusitis are the same. The primary symptoms are pain and pressure around the affected sinuses. Other symptoms include:  Upper toothache.  Earache.  Headache.  Bad breath.  Decreased sense of smell and taste.  A cough, which worsens when you are lying flat.  Fatigue.  Fever.  Thick drainage from your nose, which often is green and may contain pus (purulent).  Swelling and warmth over the affected sinuses. DIAGNOSIS  Your caregiver will perform a physical exam. During the exam, your caregiver may:  Look in your nose for signs of abnormal growths in your nostrils (nasal polyps).  Tap over the affected sinus to check for signs of infection.  View the inside of your sinuses (endoscopy)  with a special imaging device with a light attached (endoscope), which is inserted into your sinuses. If your caregiver suspects that you have chronic sinusitis, one or more of the following tests may be recommended:  Allergy tests.  Nasal culture A sample of mucus is taken from your nose and sent to a lab and screened for bacteria.  Nasal cytology A sample of mucus is taken from your nose and examined by your caregiver to determine if your sinusitis is related to an allergy. TREATMENT  Most cases of acute sinusitis are related to a viral infection and will resolve on their own within 10 days. Sometimes medicines are prescribed to help relieve symptoms (pain medicine, decongestants, nasal steroid sprays, or saline sprays).  However, for sinusitis related to a bacterial infection, your caregiver will prescribe antibiotic medicines. These are medicines that will help kill the bacteria causing the infection.  Rarely, sinusitis is caused by a fungal infection. In theses cases, your caregiver will prescribe antifungal medicine. For some cases of chronic sinusitis, surgery is needed. Generally, these are cases in which sinusitis recurs more than 3 times per year, despite other treatments. HOME CARE INSTRUCTIONS   Drink plenty of water. Water helps thin the mucus so your sinuses can drain more easily.  Use a humidifier.  Inhale steam 3 to 4 times a day (for example, sit in the bathroom with the shower running).  Apply a warm, moist washcloth to your face 3 to 4 times a day, or as directed by your caregiver.  Use saline nasal sprays to help moisten and clean your sinuses.  Take over-the-counter or prescription medicines for pain, discomfort, or fever only as directed by your caregiver. SEEK IMMEDIATE MEDICAL CARE IF:  You have increasing pain or severe headaches.  You have nausea, vomiting, or drowsiness.  You have swelling around your face.  You have vision problems.  You have a stiff  neck.  You have difficulty breathing. MAKE SURE YOU:   Understand these instructions.  Will watch your condition.  Will get help right away if you are not doing well or get worse. Document Released: 02/16/2005 Document Revised: 05/11/2011 Document Reviewed: 03/03/2011 [facility] Patient Information 2014 [place], [place].    Allergic Rhinitis Allergic rhinitis is when the mucous membranes in the nose respond to allergens. Allergens are particles in the air that cause your body to have an allergic reaction. This causes you to release allergic antibodies. Through a chain of events, these eventually cause you to release histamine into the blood stream. Although meant to protect the body, it is this release of histamine that causes your discomfort, such as frequent sneezing, congestion, and an itchy, runny nose.  CAUSES  Seasonal allergic rhinitis (hay fever) is caused by pollen allergens that may come from grasses, trees, and weeds. Year-round allergic rhinitis (perennial allergic rhinitis) is caused by allergens such as house dust mites, pet dander, and mold spores.  SYMPTOMS   Nasal stuffiness (congestion).  Itchy, runny nose with sneezing and tearing of the eyes. DIAGNOSIS  Your health care provider can help you determine the allergen or allergens that trigger your symptoms. If you and your health care provider are unable to determine the allergen, skin or blood testing may be used. TREATMENT  Allergic Rhinitis does not have a cure, but it can be controlled by:  Medicines and allergy shots (immunotherapy).  Avoiding the allergen. Hay fever may often be treated with antihistamines in pill or nasal spray forms. Antihistamines block the effects of histamine. There are over-the-counter medicines that may help with nasal congestion and swelling around the eyes. Check with your health care provider before taking or giving this medicine.  If avoiding the allergen or the medicine prescribed do  not work, there are many new medicines your health care provider can prescribe. Stronger medicine may be used if initial measures are ineffective. Desensitizing injections can be used if medicine and avoidance does not work. Desensitization is when a patient is given ongoing shots until the body becomes less  sensitive to the allergen. Make sure you follow up with your health care provider if problems continue. HOME CARE INSTRUCTIONS It is not possible to completely avoid allergens, but you can reduce your symptoms by taking steps to limit your exposure to them. It helps to know exactly what you are allergic to so that you can avoid your specific triggers. SEEK MEDICAL CARE IF:   You have a fever.  You develop a cough that does not stop easily (persistent).  You have shortness of breath.  You start wheezing.  Symptoms interfere with normal daily activities. Document Released: 11/11/2000 Document Revised: 12/07/2012 Document Reviewed: 10/24/2012 [facility] Patient Information 2014 [place].     Standley Brooking. Panosh M.D.

## 2013-07-06 ENCOUNTER — Other Ambulatory Visit: Payer: Self-pay | Admitting: Internal Medicine

## 2013-07-07 NOTE — Telephone Encounter (Signed)
Ok to give medicine for 1 year She was a new patient this year and we will be writing this medication

## 2013-07-07 NOTE — Telephone Encounter (Signed)
I do not see where you have prescribed this medication.  Please advise.  Thanks!

## 2013-07-07 NOTE — Telephone Encounter (Signed)
Sent by e-scribe. 

## 2013-07-26 ENCOUNTER — Ambulatory Visit (INDEPENDENT_AMBULATORY_CARE_PROVIDER_SITE_OTHER): Payer: Commercial Managed Care - HMO | Admitting: Family

## 2013-07-26 ENCOUNTER — Telehealth: Payer: Self-pay | Admitting: Internal Medicine

## 2013-07-26 ENCOUNTER — Encounter: Payer: Self-pay | Admitting: Family

## 2013-07-26 VITALS — BP 142/84 | HR 60 | Temp 97.8°F | Ht 62.5 in | Wt 170.0 lb

## 2013-07-26 DIAGNOSIS — K429 Umbilical hernia without obstruction or gangrene: Secondary | ICD-10-CM

## 2013-07-26 NOTE — Progress Notes (Signed)
Subjective:    Patient ID: Julie Boone, female    DOB: Feb 07, 1925, 80 y.o.   MRN: [id number]  HPI 80 year old white female nonsmoker who presents today with complaints of worsening umbilical hernia. Reports having a hernia since 2006 after having an appendectomy. She's been weighing a girdle and from time to time and a hernia causes pain. Reports pulling heavy furniture and noticed a bulge was larger. Reports lifting makes it worse, recent pain a 4/10. No pain otherwise.   Review of Systems  Constitutional: Negative.   Respiratory: Negative.   Cardiovascular: Negative.   Gastrointestinal: Positive for abdominal pain and abdominal distention.  Endocrine: Negative.   Genitourinary: Negative.   Musculoskeletal: Negative.   Skin: Negative.   Neurological: Negative.   Hematological: Bruises/bleeds easily.   Past Medical History  Diagnosis Date  . Breast cancer     mastectomy 3   neg ln     History   Social History  . Marital Status: Married    Spouse Name: N/A    Number of Children: N/A  . Years of Education: N/A   Occupational History  . Not on file.   Social History Main Topics  . Smoking status: Never Smoker   . Smokeless tobacco: Never Used  . Alcohol Use: Yes     Comment: occ  . Drug Use: No  . Sexual Activity: Not on file   Other Topics Concern  . Not on file   Social History Narrative   Usually receives 5 hours of sleep per night   3 people living in the home   [place]   From [place] moved from [place]  Adopted daughter nearby.      Neg ets  Wine with meals  No tob rd.   G0P0   Married husband [place] background  [place] descent.             Past Surgical History  Procedure Laterality Date  . Breast biopsy  2008  . Appendectomy  2006  . Tonsillectomy and adenoidectomy      Childhood    Family History  Problem Relation Age of Onset  . Hypertension Mother   . Stroke Mother   . Heart failure Mother   .  Cancer Sister     lung     No Known Allergies  Current Outpatient Prescriptions on File Prior to Visit  Medication Sig Dispense Refill  . atenolol (TENORMIN) 50 MG tablet TAKE 1 TABLET BY MOUTH EVERY DAY  90 tablet  3  . CALCIUM-MAG-VIT C-VIT D PO Take by mouth daily.      . Coenzyme Q10 (CO Q 10 PO) Take 300 mg by mouth 3 (three) times daily.      . cyclobenzaprine (FLEXERIL) 10 MG tablet Take 1 tablet (10 mg total) by mouth 3 (three) times daily as needed for muscle spasms.  60 tablet  0  . diclofenac (VOLTAREN) 75 MG EC tablet Take 1 tablet (75 mg total) by mouth 2 (two) times daily as needed for moderate pain.  60 tablet  0  . lisinopril (PRINIVIL,ZESTRIL) 40 MG tablet Take 1 tablet (40 mg total) by mouth daily.  90 tablet  1  . vitamin C (ASCORBIC ACID) 500 MG tablet Take 500 mg by mouth 3 (three) times daily.       No current facility-administered medications on file prior to visit.    BP 142/84  Pulse 60  Temp(Src) 97.8 F (36.6 C) (  Oral)  Ht 5' 2.5" (1.588 m)  Wt 170 lb (77.111 kg)  BMI 30.58 kg/m2  SpO2 98%chart    Objective:   Physical Exam  Constitutional: She is oriented to person, place, and time. She appears well-developed and well-nourished.  Neck: Normal range of motion. Neck supple.  Cardiovascular: Normal rate, regular rhythm and normal heart sounds.   Pulmonary/Chest: Effort normal and breath sounds normal.  Abdominal: Soft. There is tenderness.  Umbilical hernia, reducible to palpation of the right umbilicus. No pain with reduction.   Musculoskeletal: Normal range of motion.  Neurological: She is alert and oriented to person, place, and time.  Skin: Skin is warm and dry.  Psychiatric: She has a normal mood and affect.          Assessment & Plan:  Julie Boone was seen today for hernia.  Diagnoses and associated orders for this visit:  Umbilical hernia   Consider referral to a surgeon. Patient declines. She does not want surgery. I've encouraged  her pantie girdle. No heavy lifting. Rest. Discussed signs and symptoms of strangulation. She is aware this is a medical emergency that she go to the emergency department if that happens.

## 2013-07-26 NOTE — Telephone Encounter (Signed)
Patient Information:  Caller Name: Pamala Hurry  Phone: [phone]  Patient: Julie Boone  Gender: Female  DOB: 1924-11-01  Age: 80 Years  PCP: [name] (Family Practice)  Office Follow Up:  Does the office need to follow up with this patient?: Yes  Instructions For The Office: No appt available wtih PCP.  Booked at 10:45 this morning with Cher Nakai for evaluation of abdomen.  RN Note:  Care advice and call back parameters reviewed. Understanding expressed. Appt scheduled for evaluation. PCP no available.   Symptoms  Reason For Call & Symptoms: Daughter Pamala Hurry is calling about her mother.  She reports that her mother has an umbilical Hernia "for sometime".   Daughter states the Hernia is protruding since laste yesterday afternoon 07/25/13.  Abdominal discomfort  Intermittent with walking or coughing. Afebrile. No discomfort with sitting or laying. No diffuculty with urination.  Reviewed Health History In EMR: Yes  Reviewed Medications In EMR: Yes  Reviewed Allergies In EMR: Yes  Reviewed Surgeries / Procedures: Yes  Date of Onset of Symptoms: 07/25/2013  Treatments Tried: Tylenol 650mg  po  Treatments Tried Worked: Yes  Guideline(s) Used:  Abdominal Pain - Female  Disposition Per Guideline:   See Today in Office  Reason For Disposition Reached:   Age > 60 years  Advice Given:  Call Back If:  Abdominal pain is constant and present for more than 2 hours  You become worse.  Avoid NSAIDS and Aspirin  : Avoid any drug that can irritate the stomach lining and make the pain worse (especially aspirin and NSAIDs like ibuprofen).  Rest:  Lie down and rest until you feel better.  Fluids:  Sip clear fluids only (e.g., water, flat soft drinks or 1/2 strength fruit juice) until the pain has been gone for over 2 hours. Then slowly return to a regular diet.  Diet:  Slowly advance diet from clear liquids to a bland diet  Avoid alcohol or caffeinated beverages  Avoid greasy or  fatty foods.  Patient Will Follow Care Advice:  YES  Appointment Scheduled:  07/26/2013 10:45:00 Appointment Scheduled Provider:  Roxy [facility])

## 2013-07-26 NOTE — Progress Notes (Signed)
Pre visit review using our clinic review tool, if applicable. No additional management support is needed unless otherwise documented below in the visit note. 

## 2013-07-26 NOTE — Patient Instructions (Signed)
Hernia A hernia occurs when an internal organ pushes out through a weak spot in the abdominal wall. Hernias most commonly occur in the groin and around the navel. Hernias often can be pushed back into place (reduced). Most hernias tend to get worse over time. Some abdominal hernias can get stuck in the opening (irreducible or incarcerated hernia) and cannot be reduced. An irreducible abdominal hernia which is tightly squeezed into the opening is at risk for impaired blood supply (strangulated hernia). A strangulated hernia is a medical emergency. Because of the risk for an irreducible or strangulated hernia, surgery may be recommended to repair a hernia. CAUSES   Heavy lifting.  Prolonged coughing.  Straining to have a bowel movement.  A cut (incision) made during an abdominal surgery. HOME CARE INSTRUCTIONS   Bed rest is not required. You may continue your normal activities.  Avoid lifting more than 10 pounds (4.5 kg) or straining.  Cough gently. If you are a smoker it is best to stop. Even the best hernia repair can break down with the continual strain of coughing. Even if you do not have your hernia repaired, a cough will continue to aggravate the problem.  Do not wear anything tight over your hernia. Do not try to keep it in with an outside bandage or truss. These can damage abdominal contents if they are trapped within the hernia sac.  Eat a normal diet.  Avoid constipation. Straining over long periods of time will increase hernia size and encourage breakdown of repairs. If you cannot do this with diet alone, stool softeners may be used. SEEK IMMEDIATE MEDICAL CARE IF:   You have a fever.  You develop increasing abdominal pain.  You feel nauseous or vomit.  Your hernia is stuck outside the abdomen, looks discolored, feels hard, or is tender.  You have any changes in your bowel habits or in the hernia that are unusual for you.  You have increased pain or swelling around the  hernia.  You cannot push the hernia back in place by applying gentle pressure while lying down. MAKE SURE YOU:   Understand these instructions.  Will watch your condition.  Will get help right away if you are not doing well or get worse. Document Released: 02/16/2005 Document Revised: 05/11/2011 Document Reviewed: 10/06/2007 [employer] Patient Information 2014 [employer].  

## 2013-07-26 NOTE — Telephone Encounter (Signed)
Noted  

## 2013-08-01 ENCOUNTER — Telehealth: Payer: Self-pay | Admitting: Internal Medicine

## 2013-08-01 DIAGNOSIS — K469 Unspecified abdominal hernia without obstruction or gangrene: Secondary | ICD-10-CM

## 2013-08-01 NOTE — Telephone Encounter (Signed)
Pt saw NP on 5-27 and was dx with umbilical hernia. Pt would like to proceed with seeing general surgeon for dx.

## 2013-08-01 NOTE — Telephone Encounter (Signed)
Pt daughter said she would like the referral physician to be [facility]

## 2013-08-02 ENCOUNTER — Telehealth: Payer: Self-pay | Admitting: Internal Medicine

## 2013-08-02 NOTE — Telephone Encounter (Signed)
Pt daughter called to say that her mom is in a lot of pain and can not wait till 08/14/13 for the appt at [facility] she was told by there office to call here and ask that the referral be put in as Stat

## 2013-08-02 NOTE — Telephone Encounter (Signed)
If she is in a lot of pain, should be seen urgently in the ED.

## 2013-08-03 NOTE — Telephone Encounter (Signed)
Spoke to [place] (daughter) and she reports the patient is feeling better but will try to get her to go to the emergency room.  Pamala Hurry also agrees the patient needs to be seen in the ED.  Asked Pamala Hurry to call back and give a report.

## 2013-08-03 NOTE — Telephone Encounter (Signed)
Left a message at the below listed number for Julie Boone to return my call.

## 2013-08-04 NOTE — Telephone Encounter (Signed)
Left a message for the patient's daughter to return my call.  

## 2013-08-07 NOTE — Telephone Encounter (Signed)
Left a message at the below listed number for the patient's daughter to return my call.

## 2013-08-07 NOTE — Telephone Encounter (Signed)
Spoke to the pt's daughter.  She reports that Julie Boone has refused the ER.  She continues to have pain.  Tried to call over to the surgeons office for a sooner appointment.  Was told that since the "Umbilical hernia is reducible to palpation of the right umbilicus. No pain with reduction (per Padonda's note)."  Than she cannot be seen sooner.  Non emergent.  Spoke to the triage nurse and she advised the ER if the hernia becomes larger or more painful in the meantime.  Julie Boone now reports the patient is also having a "burning" sensation in her feet.  Please advise.  Thanks!

## 2013-08-07 NOTE — Telephone Encounter (Signed)
dont think related and if no fever swelling and persists can see her after surgery evaluation

## 2013-08-08 NOTE — Telephone Encounter (Signed)
Spoke to [place].  Advised that Dr. Regis Bill does not think that the feet burning is related to her hernia.  Advised that if it continues that Dr. Regis Bill can see her after her surgery consult.  In the meantime, look for fever, swelling, redness etc and call back if needed.

## 2013-08-14 ENCOUNTER — Encounter (INDEPENDENT_AMBULATORY_CARE_PROVIDER_SITE_OTHER): Payer: Self-pay | Admitting: Surgery

## 2013-08-14 ENCOUNTER — Ambulatory Visit (INDEPENDENT_AMBULATORY_CARE_PROVIDER_SITE_OTHER): Payer: Commercial Managed Care - HMO | Admitting: Surgery

## 2013-08-14 VITALS — BP 124/76 | HR 64 | Resp 14 | Ht 65.0 in | Wt 166.6 lb

## 2013-08-14 DIAGNOSIS — K429 Umbilical hernia without obstruction or gangrene: Secondary | ICD-10-CM

## 2013-08-14 NOTE — Progress Notes (Signed)
Patient ID: Julie Boone, female   DOB: March 29, 1924, 80 y.o.   MRN: [id number]  No chief complaint on file.   Julie Boone is a 80 y.o. female.  Patient sent a request of Burnis Medin, MD For umbilical hernia. Patient has had bulge just to the right of the umbilicus for a number of months. He is getting larger causing mild discomfort and pops out from time to time. No change in bowel or bladder function. Discomfort is moderate made worse with standing location umbilicus burning in quality without radiation Julie  Past Medical History  Diagnosis Date  . Breast cancer     mastectomy 3   neg ln   . HTN (hypertension)     Past Surgical History  Procedure Laterality Date  . Breast biopsy  2008  . Appendectomy  2006  . Tonsillectomy and adenoidectomy      Childhood    Family History  Problem Relation Age of Onset  . Hypertension Mother   . Stroke Mother   . Heart failure Mother   . Cancer Sister     lung   . Colon cancer Father     Social History History  Substance Use Topics  . Smoking status: Never Smoker   . Smokeless tobacco: Never Used  . Alcohol Use: Yes     Comment: occ    No Known Allergies  Current Outpatient Prescriptions  Medication Sig Dispense Refill  . aspirin 81 MG tablet Take 81 mg by mouth as needed for pain.      [name] atenolol (TENORMIN) 50 MG tablet TAKE 1 TABLET BY MOUTH EVERY DAY  90 tablet  3  . CALCIUM-MAG-VIT C-VIT D PO Take by mouth daily.      . Coenzyme Q10 (CO Q 10 PO) Take 300 mg by mouth 3 (three) times daily.      [name] lisinopril (PRINIVIL,ZESTRIL) 40 MG tablet Take 1 tablet (40 mg total) by mouth daily.  90 tablet  1  . vitamin C (ASCORBIC ACID) 500 MG tablet Take 500 mg by mouth 3 (three) times daily.       No current facility-administered medications for this visit.    Review of Systems Review of Systems  Constitutional: Negative for fever, chills and unexpected weight change.  HENT: Negative for congestion, hearing  loss, sore throat, trouble swallowing and voice change.   Eyes: Negative for visual disturbance.  Respiratory: Negative for cough and wheezing.   Cardiovascular: Negative for chest pain, palpitations and leg swelling.  Gastrointestinal: Negative for nausea, vomiting, abdominal pain, diarrhea, constipation, blood in stool, abdominal distention and anal bleeding.  Genitourinary: Negative for hematuria, vaginal bleeding and difficulty urinating.  Musculoskeletal: Negative for arthralgias.  Skin: Negative for rash and wound.  Neurological: Negative for seizures, syncope and headaches.  Hematological: Negative for adenopathy. Does not bruise/bleed easily.  Psychiatric/Behavioral: Negative for confusion.    Blood pressure 124/76, pulse 64, resp. rate 14, height 5\' 5"  (1.651 m), weight 166 lb 9.6 oz (75.569 kg).  Physical Exam Physical Exam  Constitutional: She appears well-developed and well-nourished.  HENT:  Head: Normocephalic and atraumatic.  Eyes: Pupils are equal, round, and reactive to light. No scleral icterus.  Neck: Normal range of motion. Neck supple.  Cardiovascular: Normal rate and regular rhythm.   Pulmonary/Chest: Effort normal and breath sounds normal.  Abdominal: Soft. Bowel sounds are normal. A hernia is present.    Musculoskeletal: Normal range of motion.  Lymphadenopathy:    She has  no cervical adenopathy.  Neurological: She is alert.  Skin: Skin is warm and dry.  Psychiatric: She has a normal mood and affect. Her behavior is normal. Thought content normal.    Data Reviewed Notes from primary care  Assessment    Umbilical hernia symptomatic reducible    Plan    Recommend repair of umbilical hernia with mesh.The risk of hernia repair include bleeding,  Infection,   Recurrence of the hernia,  Mesh use, chronic pain,  Organ injury,  Bowel injury,  Bladder injury,   nerve injury with numbness around the incision,  Death,  and worsening of preexisting  medical  problems.  The alternatives to surgery have been discussed as well..  Long term expectations of both operative and non operative treatments have been discussed.   The patient agrees to proceed.       Rohil Lesch A. 08/14/2013, 3:51 PM

## 2013-08-14 NOTE — Patient Instructions (Signed)
Open Hernia Repair Open hernia repair is surgery to fix a hernia. A hernia occurs when an internal organ or tissue pushes out through a weak spot in the abdominal wall muscles. Hernias commonly occur in the groin and around the navel. Most hernias tend to get worse over time. Surgery is often done to prevent the hernia from getting bigger, becoming uncomfortable, or becoming an emergency. Emergency surgery may be needed if abdominal contents get stuck in the opening (incarcerated hernia) or the blood supply gets cut off (strangulated hernia). In an open repair, a large cut (incision) is made in the abdomen to perform the surgery. LET [facility] CARE PROVIDER KNOW ABOUT:  Any allergies you have.  All medicines you are taking, including vitamins, herbs, eye drops, creams, and over-the-counter medicines.  Previous problems you or members of your family have had with the use of anesthetics.  Any blood disorders you have.  Previous surgeries you have had.  Medical conditions you have. RISKS AND COMPLICATIONS Generally, this is a safe procedure. However, as with any procedure, complications can occur. Possible complications include:  Infection.  Bleeding.  Nerve injury.  Chronic pain.  The hernia can come back.  Injury to the intestines. BEFORE THE PROCEDURE  Ask your health care provider about changing or stopping any regular medicines. Avoid taking aspirin or blood thinners as directed by your health care provider.  Do noteat or drink anything after midnight the night before surgery.  If you smoke, do not smoke for at least 2 weeks before your surgery.  Do not drink alcohol the day before your surgery.  Let your health care provider know if you develop a cold or any infection before your surgery.  Arrange for someone to drive you home after the procedure or after your hospital stay. Also arrange for someone to help you with activities during recovery. PROCEDURE   Small  monitors will be put on your body. They are used to check your heart, blood pressure, and oxygen level.   An IV access tube will be put into one of your veins. Medicine will be able to flow directly into your body through this IV tube.   You might be given a medicine to help you relax (sedative).   You will be given a medicine to make you sleep (general anesthetic). A breathing tube may be placed into your lungs during the procedure.  A cut (incision) is made over the hernia defect, and the contents are pushed back into the abdomen.  If the hernia is small, stitches may be used to bring the muscle edges back together.  Typically, a surgeon will place a mesh patch made of man-made material (synthetic) to cover the defect. The mesh is sewn to healthy muscle. This reduces the risk of the hernia coming back.  The tissue and skin over the hernia are then closed with stitches or staples.  If the hernia was large, a drain may be left in place to collect excess fluid where the hernia used to be.  Bandages (dressings) are used to cover the incision. AFTER THE PROCEDURE  You will be taken to a recovery area where your progress will be monitored.  If the hernia was small or in the groin (inguinal) region, you will likely be allowed to go home once you are awake, stable, and taking fluids well.  If the hernia was large, you may have to wait for your bowel function to return. You may need to stay in the hospital  for 2 3 days until you can eat and your pain is controlled. A drain may be left in place for 5 7 days. You will be taught how to care for the drain. Document Released: 08/12/2000 Document Revised: 12/07/2012 Document Reviewed: 09/28/2012 [facility] Patient Information 2014 [place], [place]. Hernia A hernia occurs when an internal organ pushes out through a weak spot in the abdominal wall. Hernias most commonly occur in the groin and around the navel. Hernias often can be pushed back into  place (reduced). Most hernias tend to get worse over time. Some abdominal hernias can get stuck in the opening (irreducible or incarcerated hernia) and cannot be reduced. An irreducible abdominal hernia which is tightly squeezed into the opening is at risk for impaired blood supply (strangulated hernia). A strangulated hernia is a medical emergency. Because of the risk for an irreducible or strangulated hernia, surgery may be recommended to repair a hernia. CAUSES   Heavy lifting.  Prolonged coughing.  Straining to have a bowel movement.  A cut (incision) made during an abdominal surgery. HOME CARE INSTRUCTIONS   Bed rest is not required. You may continue your normal activities.  Avoid lifting more than 10 pounds (4.5 kg) or straining.  Cough gently. If you are a smoker it is best to stop. Even the best hernia repair can break down with the continual strain of coughing. Even if you do not have your hernia repaired, a cough will continue to aggravate the problem.  Do not wear anything tight over your hernia. Do not try to keep it in with an outside bandage or truss. These can damage abdominal contents if they are trapped within the hernia sac.  Eat a normal diet.  Avoid constipation. Straining over long periods of time will increase hernia size and encourage breakdown of repairs. If you cannot do this with diet alone, stool softeners may be used. SEEK IMMEDIATE MEDICAL CARE IF:   You have a fever.  You develop increasing abdominal pain.  You feel nauseous or vomit.  Your hernia is stuck outside the abdomen, looks discolored, feels hard, or is tender.  You have any changes in your bowel habits or in the hernia that are unusual for you.  You have increased pain or swelling around the hernia.  You cannot push the hernia back in place by applying gentle pressure while lying down. MAKE SURE YOU:   Understand these instructions.  Will watch your condition.  Will get help right  away if you are not doing well or get worse. Document Released: 02/16/2005 Document Revised: 05/11/2011 Document Reviewed: 10/06/2007 [facility] Patient Information 2014 [place].

## 2013-08-29 ENCOUNTER — Encounter (HOSPITAL_BASED_OUTPATIENT_CLINIC_OR_DEPARTMENT_OTHER): Payer: Self-pay | Admitting: *Deleted

## 2013-08-29 ENCOUNTER — Ambulatory Visit
Admission: RE | Admit: 2013-08-29 | Discharge: 2013-08-29 | Disposition: A | Discharge status: Home / Self Care | Payer: Commercial Managed Care - HMO | Source: Ambulatory Visit | Attending: Surgery | Admitting: Surgery

## 2013-08-29 ENCOUNTER — Other Ambulatory Visit (INDEPENDENT_AMBULATORY_CARE_PROVIDER_SITE_OTHER): Payer: Self-pay | Admitting: Surgery

## 2013-08-29 ENCOUNTER — Telehealth (INDEPENDENT_AMBULATORY_CARE_PROVIDER_SITE_OTHER): Payer: Self-pay

## 2013-08-29 ENCOUNTER — Ambulatory Visit: Admission: RE | Admit: 2013-08-29 | Payer: Commercial Managed Care - HMO | Source: Ambulatory Visit

## 2013-08-29 ENCOUNTER — Encounter (HOSPITAL_COMMUNITY): Admission: RE | Admit: 2013-08-29 | Payer: Medicare HMO | Source: Ambulatory Visit

## 2013-08-29 ENCOUNTER — Encounter (HOSPITAL_BASED_OUTPATIENT_CLINIC_OR_DEPARTMENT_OTHER)
Admission: RE | Admit: 2013-08-29 | Discharge: 2013-08-29 | Disposition: A | Discharge status: Home / Self Care | Payer: Medicare HMO | Source: Ambulatory Visit | Attending: Surgery | Admitting: Surgery

## 2013-08-29 DIAGNOSIS — Z01810 Encounter for preprocedural cardiovascular examination: Secondary | ICD-10-CM | POA: Insufficient documentation

## 2013-08-29 DIAGNOSIS — I10 Essential (primary) hypertension: Secondary | ICD-10-CM

## 2013-08-29 DIAGNOSIS — Z01812 Encounter for preprocedural laboratory examination: Secondary | ICD-10-CM | POA: Insufficient documentation

## 2013-08-29 DIAGNOSIS — R918 Other nonspecific abnormal finding of lung field: Secondary | ICD-10-CM

## 2013-08-29 LAB — CBC WITH DIFFERENTIAL/PLATELET
BASOS ABS: 0.1 10*3/uL (ref 0.0–0.1)
Basophils Relative: 1 % (ref 0–1)
Eosinophils Absolute: 0.5 10*3/uL (ref 0.0–0.7)
Eosinophils Relative: 6 % — ABNORMAL HIGH (ref 0–5)
HCT: 42 % (ref 36.0–46.0)
Hemoglobin: 13.8 g/dL (ref 12.0–15.0)
Lymphocytes Relative: 27 % (ref 12–46)
Lymphs Abs: 2.4 10*3/uL (ref 0.7–4.0)
MCH: 32.5 pg (ref 26.0–34.0)
MCHC: 32.9 g/dL (ref 30.0–36.0)
MCV: 99.1 fL (ref 78.0–100.0)
Monocytes Absolute: 0.8 10*3/uL (ref 0.1–1.0)
Monocytes Relative: 10 % (ref 3–12)
Neutro Abs: 4.9 10*3/uL (ref 1.7–7.7)
Neutrophils Relative %: 56 % (ref 43–77)
Platelets: 208 10*3/uL (ref 150–400)
RBC: 4.24 MIL/uL (ref 3.87–5.11)
RDW: 12.5 % (ref 11.5–15.5)
WBC: 8.6 10*3/uL (ref 4.0–10.5)

## 2013-08-29 LAB — COMPREHENSIVE METABOLIC PANEL
ALBUMIN: 3.9 g/dL (ref 3.5–5.2)
ALK PHOS: 91 U/L (ref 39–117)
ALT: 20 U/L (ref 0–35)
AST: 22 U/L (ref 0–37)
BUN: 25 mg/dL — ABNORMAL HIGH (ref 6–23)
CO2: 25 mEq/L (ref 19–32)
Calcium: 9.6 mg/dL (ref 8.4–10.5)
Chloride: 104 mEq/L (ref 96–112)
Creatinine, Ser: 1.01 mg/dL (ref 0.50–1.10)
GFR calc Af Amer: 56 mL/min — ABNORMAL LOW (ref >90)
GFR calc non Af Amer: 49 mL/min — ABNORMAL LOW (ref >90)
Glucose, Bld: 106 mg/dL — ABNORMAL HIGH (ref 70–99)
POTASSIUM: 5.1 meq/L (ref 3.7–5.3)
Sodium: 142 mEq/L (ref 137–147)
Total Bilirubin: 0.3 mg/dL (ref 0.3–1.2)
Total Protein: 6.9 g/dL (ref 6.0–8.3)

## 2013-08-29 NOTE — Telephone Encounter (Signed)
[facility] was calling to let Dr Brantley Stage know that pt's results from her Chest X-ray where back and in epic for him to review. Informed her that I would send Dr Brantley Stage a message making him aware.

## 2013-08-29 NOTE — Progress Notes (Signed)
Parents moved from [place] to live with daughter-both do not hear well-will come in for labs ekg cxr-pt alert-self care-no cardiac or resp problems

## 2013-08-30 ENCOUNTER — Telehealth (INDEPENDENT_AMBULATORY_CARE_PROVIDER_SITE_OTHER): Payer: Self-pay

## 2013-08-30 ENCOUNTER — Other Ambulatory Visit (INDEPENDENT_AMBULATORY_CARE_PROVIDER_SITE_OTHER): Payer: Self-pay | Admitting: *Deleted

## 2013-08-30 DIAGNOSIS — R918 Other nonspecific abnormal finding of lung field: Secondary | ICD-10-CM

## 2013-08-30 NOTE — Telephone Encounter (Signed)
LM on pts VM. Pre op xray shows pulmonary abnormality. Further work up needed. Dr Brantley Stage ordered a chest CT. We will call with the appt. This will not interfere with her scheduled surgery.

## 2013-08-30 NOTE — Telephone Encounter (Signed)
Pt was returning call. Advised pt of Michelle's message of pulmonary abnormality on Chest xray and Dr Brantley Stage has ordered a CT. Advised pt that this will not interfere with her sx date. Advised pt that we would call with a date and time once the CT has been scheduled.   Pt verbalized understanding.

## 2013-09-05 ENCOUNTER — Encounter (HOSPITAL_BASED_OUTPATIENT_CLINIC_OR_DEPARTMENT_OTHER): Payer: Self-pay | Admitting: Anesthesiology

## 2013-09-05 ENCOUNTER — Encounter (HOSPITAL_BASED_OUTPATIENT_CLINIC_OR_DEPARTMENT_OTHER)
Admission: RE | Disposition: A | Discharge status: Home / Self Care | Payer: Self-pay | Source: Ambulatory Visit | Attending: Surgery

## 2013-09-05 ENCOUNTER — Ambulatory Visit (HOSPITAL_BASED_OUTPATIENT_CLINIC_OR_DEPARTMENT_OTHER): Payer: Medicare HMO | Admitting: Anesthesiology

## 2013-09-05 ENCOUNTER — Ambulatory Visit (HOSPITAL_BASED_OUTPATIENT_CLINIC_OR_DEPARTMENT_OTHER)
Admission: RE | Admit: 2013-09-05 | Discharge: 2013-09-05 | Disposition: A | Discharge status: Home / Self Care | Payer: Medicare HMO | Source: Ambulatory Visit | Attending: Surgery | Admitting: Surgery

## 2013-09-05 ENCOUNTER — Encounter (HOSPITAL_BASED_OUTPATIENT_CLINIC_OR_DEPARTMENT_OTHER): Payer: Medicare HMO | Admitting: Anesthesiology

## 2013-09-05 DIAGNOSIS — K429 Umbilical hernia without obstruction or gangrene: Secondary | ICD-10-CM

## 2013-09-05 DIAGNOSIS — I10 Essential (primary) hypertension: Secondary | ICD-10-CM | POA: Insufficient documentation

## 2013-09-05 DIAGNOSIS — Z7982 Long term (current) use of aspirin: Secondary | ICD-10-CM | POA: Insufficient documentation

## 2013-09-05 DIAGNOSIS — Z853 Personal history of malignant neoplasm of breast: Secondary | ICD-10-CM | POA: Insufficient documentation

## 2013-09-05 DIAGNOSIS — Z9010 Acquired absence of unspecified breast and nipple: Secondary | ICD-10-CM | POA: Insufficient documentation

## 2013-09-05 HISTORY — DX: Unspecified osteoarthritis, unspecified site: M19.90

## 2013-09-05 HISTORY — DX: Presence of spectacles and contact lenses: Z97.3

## 2013-09-05 HISTORY — PX: UMBILICAL HERNIA REPAIR: SHX196

## 2013-09-05 HISTORY — DX: Unspecified hearing loss, unspecified ear: H91.90

## 2013-09-05 HISTORY — PX: INSERTION OF MESH: SHX5868

## 2013-09-05 HISTORY — DX: Presence of external hearing-aid: Z97.4

## 2013-09-05 SURGERY — REPAIR, HERNIA, UMBILICAL, ADULT
Anesthesia: General | Site: Abdomen

## 2013-09-05 MED ORDER — CEFAZOLIN SODIUM-DEXTROSE 2-3 GM-% IV SOLR
INTRAVENOUS | Status: AC
  Filled 2013-09-05: qty 50

## 2013-09-05 MED ORDER — MIDAZOLAM HCL 2 MG/2ML IJ SOLN: 1.0000 mg | INTRAMUSCULAR | Status: DC | PRN

## 2013-09-05 MED ORDER — CEFAZOLIN SODIUM-DEXTROSE 2-3 GM-% IV SOLR: 2.0000 g | INTRAVENOUS | Status: DC

## 2013-09-05 MED ORDER — PROPOFOL 10 MG/ML IV BOLUS
INTRAVENOUS | Status: DC | PRN
  Administered 2013-09-05: 130 mg via INTRAVENOUS

## 2013-09-05 MED ORDER — CEFAZOLIN SODIUM-DEXTROSE 2-3 GM-% IV SOLR
INTRAVENOUS | Status: DC | PRN
  Administered 2013-09-05: 2 g via INTRAVENOUS

## 2013-09-05 MED ORDER — HYDROCODONE-ACETAMINOPHEN 5-325 MG PO TABS
ORAL_TABLET | ORAL | Status: AC
  Filled 2013-09-05: qty 1

## 2013-09-05 MED ORDER — LIDOCAINE HCL (CARDIAC) 20 MG/ML IV SOLN
INTRAVENOUS | Status: DC | PRN
  Administered 2013-09-05: 40 mg via INTRAVENOUS

## 2013-09-05 MED ORDER — CHLORHEXIDINE GLUCONATE 4 % EX LIQD: 1.0000 "application " | Freq: Once | CUTANEOUS | Status: DC

## 2013-09-05 MED ORDER — BUPIVACAINE-EPINEPHRINE 0.25% -1:200000 IJ SOLN
INTRAMUSCULAR | Status: DC | PRN
  Administered 2013-09-05: 10 mL

## 2013-09-05 MED ORDER — FENTANYL CITRATE 0.05 MG/ML IJ SOLN
25.0000 ug | INTRAMUSCULAR | Status: DC | PRN
  Administered 2013-09-05 (×2): 25 ug via INTRAVENOUS

## 2013-09-05 MED ORDER — DEXAMETHASONE SODIUM PHOSPHATE 4 MG/ML IJ SOLN
INTRAMUSCULAR | Status: DC | PRN
  Administered 2013-09-05: 10 mg via INTRAVENOUS

## 2013-09-05 MED ORDER — HYDROCODONE-ACETAMINOPHEN 5-325 MG PO TABS: 1.0000 | ORAL_TABLET | Freq: Once | ORAL | Status: DC

## 2013-09-05 MED ORDER — GLYCOPYRROLATE 0.2 MG/ML IJ SOLN
INTRAMUSCULAR | Status: DC | PRN
  Administered 2013-09-05: 0.2 mg via INTRAVENOUS

## 2013-09-05 MED ORDER — MIDAZOLAM HCL 2 MG/2ML IJ SOLN
INTRAMUSCULAR | Status: AC
  Filled 2013-09-05: qty 2

## 2013-09-05 MED ORDER — OXYCODONE HCL 5 MG PO TABS
ORAL_TABLET | ORAL | Status: AC
  Filled 2013-09-05: qty 1

## 2013-09-05 MED ORDER — FENTANYL CITRATE 0.05 MG/ML IJ SOLN
INTRAMUSCULAR | Status: DC | PRN
  Administered 2013-09-05 (×4): 25 ug via INTRAVENOUS
  Administered 2013-09-05 (×2): 50 ug via INTRAVENOUS

## 2013-09-05 MED ORDER — BUPIVACAINE-EPINEPHRINE (PF) 0.25% -1:200000 IJ SOLN
INTRAMUSCULAR | Status: AC
  Filled 2013-09-05: qty 30

## 2013-09-05 MED ORDER — LACTATED RINGERS IV SOLN
INTRAVENOUS | Status: DC
  Administered 2013-09-05: 10:00:00 via INTRAVENOUS

## 2013-09-05 MED ORDER — FENTANYL CITRATE 0.05 MG/ML IJ SOLN
INTRAMUSCULAR | Status: AC
  Filled 2013-09-05: qty 6

## 2013-09-05 MED ORDER — FENTANYL CITRATE 0.05 MG/ML IJ SOLN
50.0000 ug | INTRAMUSCULAR | Status: DC | PRN
  Administered 2013-09-05: 25 ug via INTRAVENOUS

## 2013-09-05 MED ORDER — HYDROCODONE-ACETAMINOPHEN 5-325 MG PO TABS: 1.0000 | ORAL_TABLET | Freq: Four times a day (QID) | ORAL | Status: DC | PRN

## 2013-09-05 MED ORDER — EPHEDRINE SULFATE 50 MG/ML IJ SOLN
INTRAMUSCULAR | Status: DC | PRN
  Administered 2013-09-05: 10 mg via INTRAVENOUS

## 2013-09-05 MED ORDER — ONDANSETRON HCL 4 MG/2ML IJ SOLN
INTRAMUSCULAR | Status: DC | PRN
  Administered 2013-09-05: 4 mg via INTRAVENOUS

## 2013-09-05 MED ORDER — FENTANYL CITRATE 0.05 MG/ML IJ SOLN
INTRAMUSCULAR | Status: AC
  Filled 2013-09-05: qty 2

## 2013-09-05 SURGICAL SUPPLY — 57 items
BENZOIN TINCTURE PRP APPL 2/3 (GAUZE/BANDAGES/DRESSINGS) IMPLANT
BLADE SURG 10 STRL SS (BLADE) IMPLANT
BLADE SURG 15 STRL LF DISP TIS (BLADE) ×1 IMPLANT
BLADE SURG 15 STRL SS (BLADE) ×2
BLADE SURG ROTATE 9660 (MISCELLANEOUS) IMPLANT
CANISTER SUCT 1200ML W/VALVE (MISCELLANEOUS) ×3 IMPLANT
CHLORAPREP W/TINT 26ML (MISCELLANEOUS) ×3 IMPLANT
CLOSURE WOUND 1/2 X4 (GAUZE/BANDAGES/DRESSINGS)
COVER MAYO STAND STRL (DRAPES) ×3 IMPLANT
COVER TABLE BACK 60X90 (DRAPES) ×3 IMPLANT
DECANTER SPIKE VIAL GLASS SM (MISCELLANEOUS) IMPLANT
DERMABOND ADVANCED (GAUZE/BANDAGES/DRESSINGS) ×2
DERMABOND ADVANCED .7 DNX12 (GAUZE/BANDAGES/DRESSINGS) ×1 IMPLANT
DRAPE LAPAROTOMY TRNSV 102X78 (DRAPE) ×3 IMPLANT
DRAPE UTILITY XL STRL (DRAPES) ×3 IMPLANT
DRSG TEGADERM 4X4.75 (GAUZE/BANDAGES/DRESSINGS) IMPLANT
ELECT COATED BLADE 2.86 ST (ELECTRODE) ×3 IMPLANT
ELECT REM PT RETURN 9FT ADLT (ELECTROSURGICAL) ×3
ELECTRODE REM PT RTRN 9FT ADLT (ELECTROSURGICAL) ×1 IMPLANT
GLOVE BIO SURGEON STRL SZ 6.5 (GLOVE) ×2 IMPLANT
GLOVE BIO SURGEONS STRL SZ 6.5 (GLOVE) ×1
GLOVE BIOGEL PI IND STRL 6.5 (GLOVE) ×1 IMPLANT
GLOVE BIOGEL PI IND STRL 7.0 (GLOVE) ×1 IMPLANT
GLOVE BIOGEL PI IND STRL 8 (GLOVE) ×1 IMPLANT
GLOVE BIOGEL PI INDICATOR 6.5 (GLOVE) ×2
GLOVE BIOGEL PI INDICATOR 7.0 (GLOVE) ×2
GLOVE BIOGEL PI INDICATOR 8 (GLOVE) ×2
GLOVE ECLIPSE 6.5 STRL STRAW (GLOVE) ×3 IMPLANT
GLOVE ECLIPSE 8.0 STRL XLNG CF (GLOVE) ×3 IMPLANT
GOWN STRL REUS W/ TWL LRG LVL3 (GOWN DISPOSABLE) ×3 IMPLANT
GOWN STRL REUS W/TWL LRG LVL3 (GOWN DISPOSABLE) ×6
MESH VENTRALEX ST 8CM LRG (Mesh General) ×3 IMPLANT
NEEDLE HYPO 22GX1.5 SAFETY (NEEDLE) IMPLANT
NEEDLE HYPO 25X1 1.5 SAFETY (NEEDLE) ×3 IMPLANT
NS IRRIG 1000ML POUR BTL (IV SOLUTION) ×3 IMPLANT
PACK BASIN DAY SURGERY FS (CUSTOM PROCEDURE TRAY) ×3 IMPLANT
PENCIL BUTTON HOLSTER BLD 10FT (ELECTRODE) ×3 IMPLANT
SLEEVE SCD COMPRESS KNEE MED (MISCELLANEOUS) ×3 IMPLANT
STAPLER VISISTAT 35W (STAPLE) IMPLANT
STRIP CLOSURE SKIN 1/2X4 (GAUZE/BANDAGES/DRESSINGS) IMPLANT
SUT MON AB 4-0 PC3 18 (SUTURE) ×3 IMPLANT
SUT NOVA NAB DX-16 0-1 5-0 T12 (SUTURE) ×9 IMPLANT
SUT NOVA NAB GS-22 2 0 T19 (SUTURE) IMPLANT
SUT PROLENE 0 CT 1 30 (SUTURE) IMPLANT
SUT SILK 3 0 SH 30 (SUTURE) IMPLANT
SUT VIC AB 0 SH 27 (SUTURE) ×3 IMPLANT
SUT VIC AB 2-0 SH 27 (SUTURE) ×2
SUT VIC AB 2-0 SH 27XBRD (SUTURE) ×1 IMPLANT
SUT VIC AB 3-0 SH 27 (SUTURE) ×2
SUT VIC AB 3-0 SH 27X BRD (SUTURE) ×1 IMPLANT
SUT VICRYL 3-0 CR8 SH (SUTURE) IMPLANT
SYRINGE CONTROL L 12CC (SYRINGE) ×3 IMPLANT
TOWEL OR 17X24 6PK STRL BLUE (TOWEL DISPOSABLE) ×3 IMPLANT
TOWEL OR NON WOVEN STRL DISP B (DISPOSABLE) IMPLANT
TUBE CONNECTING 20'X1/4 (TUBING) ×1
TUBE CONNECTING 20X1/4 (TUBING) ×2 IMPLANT
YANKAUER SUCT BULB TIP NO VENT (SUCTIONS) ×3 IMPLANT

## 2013-09-05 NOTE — Telephone Encounter (Signed)
Pt has been notified per Neoma Laming

## 2013-09-05 NOTE — Transfer of Care (Signed)
Immediate Anesthesia Transfer of Care Note  Patient: Julie Boone  Procedure(s) Performed: Procedure(s): HERNIA REPAIR UMBILICAL ADULT WITH MESH (N/A) INSERTION OF MESH (N/A)  Patient Location: PACU  Anesthesia Type:General  Level of Consciousness: awake, alert , oriented and patient cooperative  Airway & Oxygen Therapy: Patient Spontanous Breathing and Patient connected to face mask oxygen  Post-op Assessment: Report given to PACU RN and Post -op Vital signs reviewed and stable  Post vital signs: Reviewed and stable  Complications: No apparent anesthesia complications

## 2013-09-05 NOTE — Anesthesia Procedure Notes (Signed)
Procedure Name: LMA Insertion Date/Time: 09/05/2013 10:27 AM Performed by: Toula Moos L Pre-anesthesia Checklist: Patient identified, Emergency Drugs available, Suction available, Patient being monitored and Timeout performed Patient Re-evaluated:Patient Re-evaluated prior to inductionOxygen Delivery Method: Circle System Utilized Preoxygenation: Pre-oxygenation with 100% oxygen Intubation Type: IV induction Ventilation: Mask ventilation without difficulty LMA: LMA inserted LMA Size: 4.0 Number of attempts: 1 Airway Equipment and Method: bite block Placement Confirmation: positive ETCO2 and breath sounds checked- equal and bilateral Tube secured with: Tape Dental Injury: Teeth and Oropharynx as per pre-operative assessment

## 2013-09-05 NOTE — Interval H&P Note (Signed)
History and Physical Interval Note:  09/05/2013 9:47 AM  Julie Boone  has presented today for surgery, with the diagnosis of umbilical hernia  The various methods of treatment have been discussed with the patient and family. After consideration of risks, benefits and other options for treatment, the patient has consented to  Procedure(s): HERNIA REPAIR UMBILICAL ADULT WITH MESH (N/A) INSERTION OF MESH (N/A) as a surgical intervention .  The patient's history has been reviewed, patient examined, no change in status, stable for surgery.  I have reviewed the patient's chart and labs.  Questions were answered to the patient's satisfaction.     Julie Boone A.

## 2013-09-05 NOTE — Op Note (Signed)
Julie Boone [birth year] [id number] 09/05/2013  Preoperative diagnosis: umbilical hernia reducuble  Postoperative diagnosis: 4 cm x 2 cm umbilical hernia reducible  Procedure: Umbilical Hernia Repair with circular Ventralight coated mesh  Surgeon: Turner Daniels, MD, FACS  Anesthesia: General and 0.25 % marcaine with epinephrine   Clinical History and Indications: patient presents with umbilical hernia. This is reducible and causing pain. She was to have it repaired.The risk of hernia repair include bleeding,  Infection,   Recurrence of the hernia,  Mesh use, chronic pain,  Organ injury,  Bowel injury,  Bladder injury,   nerve injury with numbness around the incision,  Death,  and worsening of preexisting  medical problems.  The alternatives to surgery have been discussed as well..  Long term expectations of both operative and non operative treatments have been discussed.   The patient agrees to proceed.  Procedure: The patient was seen in the preoperative area and the plans for the procedure reviewed again. She  had no further questions. I marked the area of the umbilicus as the operative site. She wishes to prodeed.  The patient was taken to the operating room and after satisfactory general anesthesia had been obtained the area was clipped as needed, prepped and draped. The timeout was performed.  I used some 0.25% Marcaine anesthesia to help with postoperative pain management. This was infiltrated around the umbilical area and additional infiltrated as I worked.  A curvilinear incision was made on the inferior aspect of the umbilicus. The umbilical skin was elevated off of the hernia sac. The hernia sac was dissected free of the subcutaneous tissues.  The hernia defect measured 4 cm x 2 cm. There is some omentum adhesed within the sac. This was dissected free and replaced back in the abdominal cavity. The fascial edges were cleaned off circumferentially for 2-3 cm. Ventral light  coated mesh 8 cm circular in shape used in underlie fashion. Care was taken not to entrap any viscera. Mesh secured with #1 Novafil pop-off circumferentially. There between mesh in the abdominal cavity I could feel. Fascia then closed with #1 Novafil pop-off. These were interrupted. Subcutaneous tissues irrigated and closed with deep layer of 0 Vicryl and 3-0 Vicryl.  Once the repair was complete the incision was closed by using some 3-0 Vicryl subcutaneous and 4-0 Monocryl subcuticular sutures.  The patient tolerated the procedure well. There were no operative complications. There was minimal blood loss. All counts were correct. She was taken to the PACU in satisfactory condition.  Turner Daniels, MD, FACS 09/05/2013 11:24 AM

## 2013-09-05 NOTE — H&P (View-Only) (Signed)
Patient ID: Julie Boone, female   DOB: December 03, 1924, 80 y.o.   MRN: [id number]  No chief complaint on file.   HPI Julie Boone is a 80 y.o. female.  Patient sent a request of Burnis Medin, MD For umbilical hernia. Patient has had bulge just to the right of the umbilicus for a number of months. He is getting larger causing mild discomfort and pops out from time to time. No change in bowel or bladder function. Discomfort is moderate made worse with standing location umbilicus burning in quality without radiation HPI  Past Medical History  Diagnosis Date  . Breast cancer     mastectomy 3   neg ln   . HTN (hypertension)     Past Surgical History  Procedure Laterality Date  . Breast biopsy  2008  . Appendectomy  2006  . Tonsillectomy and adenoidectomy      Childhood    Family History  Problem Relation Age of Onset  . Hypertension Mother   . Stroke Mother   . Heart failure Mother   . Cancer Sister     lung   . Colon cancer Father     Social History History  Substance Use Topics  . Smoking status: Never Smoker   . Smokeless tobacco: Never Used  . Alcohol Use: Yes     Comment: occ    No Known Allergies  Current Outpatient Prescriptions  Medication Sig Dispense Refill  . aspirin 81 MG tablet Take 81 mg by mouth as needed for pain.      [name] atenolol (TENORMIN) 50 MG tablet TAKE 1 TABLET BY MOUTH EVERY DAY  90 tablet  3  . CALCIUM-MAG-VIT C-VIT D PO Take by mouth daily.      . Coenzyme Q10 (CO Q 10 PO) Take 300 mg by mouth 3 (three) times daily.      [name] lisinopril (PRINIVIL,ZESTRIL) 40 MG tablet Take 1 tablet (40 mg total) by mouth daily.  90 tablet  1  . vitamin C (ASCORBIC ACID) 500 MG tablet Take 500 mg by mouth 3 (three) times daily.       No current facility-administered medications for this visit.    Review of Systems Review of Systems  Constitutional: Negative for fever, chills and unexpected weight change.  HENT: Negative for congestion, hearing  loss, sore throat, trouble swallowing and voice change.   Eyes: Negative for visual disturbance.  Respiratory: Negative for cough and wheezing.   Cardiovascular: Negative for chest pain, palpitations and leg swelling.  Gastrointestinal: Negative for nausea, vomiting, abdominal pain, diarrhea, constipation, blood in stool, abdominal distention and anal bleeding.  Genitourinary: Negative for hematuria, vaginal bleeding and difficulty urinating.  Musculoskeletal: Negative for arthralgias.  Skin: Negative for rash and wound.  Neurological: Negative for seizures, syncope and headaches.  Hematological: Negative for adenopathy. Does not bruise/bleed easily.  Psychiatric/Behavioral: Negative for confusion.    Blood pressure 124/76, pulse 64, resp. rate 14, height 5\' 5"  (1.651 m), weight 166 lb 9.6 oz (75.569 kg).  Physical Exam Physical Exam  Constitutional: She appears well-developed and well-nourished.  HENT:  Head: Normocephalic and atraumatic.  Eyes: Pupils are equal, round, and reactive to light. No scleral icterus.  Neck: Normal range of motion. Neck supple.  Cardiovascular: Normal rate and regular rhythm.   Pulmonary/Chest: Effort normal and breath sounds normal.  Abdominal: Soft. Bowel sounds are normal. A hernia is present.    Musculoskeletal: Normal range of motion.  Lymphadenopathy:    She has  no cervical adenopathy.  Neurological: She is alert.  Skin: Skin is warm and dry.  Psychiatric: She has a normal mood and affect. Her behavior is normal. Thought content normal.    Data Reviewed Notes from primary care  Assessment    Umbilical hernia symptomatic reducible    Plan    Recommend repair of umbilical hernia with mesh.The risk of hernia repair include bleeding,  Infection,   Recurrence of the hernia,  Mesh use, chronic pain,  Organ injury,  Bowel injury,  Bladder injury,   nerve injury with numbness around the incision,  Death,  and worsening of preexisting  medical  problems.  The alternatives to surgery have been discussed as well..  Long term expectations of both operative and non operative treatments have been discussed.   The patient agrees to proceed.       Lacrecia Delval A. 08/14/2013, 3:51 PM

## 2013-09-05 NOTE — Anesthesia Preprocedure Evaluation (Addendum)
Anesthesia Evaluation  Patient identified by MRN, date of birth, ID band Patient awake    Reviewed: Allergy & Precautions, H&P , NPO status , Patient's Chart, lab work & pertinent test results  Airway Mallampati: II      Dental   Pulmonary neg pulmonary ROS,  breath sounds clear to auscultation        Cardiovascular hypertension, Rhythm:Regular Rate:Normal     Neuro/Psych    GI/Hepatic negative GI ROS, Neg liver ROS,   Endo/Other  negative endocrine ROS  Renal/GU negative Renal ROS     Musculoskeletal   Abdominal   Peds  Hematology   Anesthesia Other Findings   Reproductive/Obstetrics                          Anesthesia Physical Anesthesia Plan  ASA: III  Anesthesia Plan: General   Post-op Pain Management:    Induction: Intravenous  Airway Management Planned: LMA  Additional Equipment:   Intra-op Plan:   Post-operative Plan: Extubation in OR  Informed Consent: I have reviewed the patients History and Physical, chart, labs and discussed the procedure including the risks, benefits and alternatives for the proposed anesthesia with the patient or authorized representative who has indicated his/her understanding and acceptance.   Dental advisory given  Plan Discussed with: CRNA and Anesthesiologist  Anesthesia Plan Comments:        Anesthesia Quick Evaluation

## 2013-09-05 NOTE — Discharge Instructions (Signed)
[facility] _______[facility] ° °UMBILICAL OR INGUINAL HERNIA REPAIR: POST OP INSTRUCTIONS ° °Always review your discharge instruction sheet given to you by the facility where your surgery was performed. °IF YOU HAVE DISABILITY OR FAMILY LEAVE FORMS, YOU MUST BRING THEM TO THE OFFICE FOR PROCESSING.   °DO NOT GIVE THEM TO YOUR DOCTOR. ° °1. A  prescription for pain medication may be given to you upon discharge.  Take your pain medication as prescribed, if needed.  If narcotic pain medicine is not needed, then you may take acetaminophen (Tylenol) or ibuprofen (Advil) as needed. °2. Take your usually prescribed medications unless otherwise directed. °3. If you need a refill on your pain medication, please contact your pharmacy.  They will contact our office to request authorization. Prescriptions will not be filled after 5 pm or on week-ends. °4. You should follow a light diet the first 24 hours after arrival home, such as soup and crackers, etc.  Be sure to include lots of fluids daily.  Resume your normal diet the day after surgery. °5. Most patients will experience some swelling and bruising around the umbilicus or in the groin and scrotum.  Ice packs and reclining will help.  Swelling and bruising can take several days to resolve.  °6. It is common to experience some constipation if taking pain medication after surgery.  Increasing fluid intake and taking a stool softener (such as Colace) will usually help or prevent this problem from occurring.  A mild laxative (Milk of Magnesia or Miralax) should be taken according to package directions if there are no bowel movements after 48 hours. °7. Unless discharge instructions indicate otherwise, you may remove your bandages 24-48 hours after surgery, and you may shower at that time.  You may have steri-strips (small skin tapes) in place directly over the incision.  These strips should be left on the skin for 7-10 days.  If your surgeon used skin glue on the  incision, you may shower in 24 hours.  The glue will flake off over the next 2-3 weeks.  Any sutures or staples will be removed at the office during your follow-up visit. °8. ACTIVITIES:  You may resume regular (light) daily activities beginning the next day--such as daily self-care, walking, climbing stairs--gradually increasing activities as tolerated.  You may have sexual intercourse when it is comfortable.  Refrain from any heavy lifting or straining until approved by your doctor. °a. You may drive when you are no longer taking prescription pain medication, you can comfortably wear a seatbelt, and you can safely maneuver your car and apply brakes. °b. RETURN TO WORK:  __________________________________________________________ °9. You should see your doctor in the office for a follow-up appointment approximately 2-3 weeks after your surgery.  Make sure that you call for this appointment within a day or two after you arrive home to insure a convenient appointment time. °10. OTHER INSTRUCTIONS:  __________________________________________________________________________________________________________________________________________________________________________________________  °WHEN TO CALL YOUR DOCTOR: °1. Fever over 101.0 °2. Inability to urinate °3. Nausea and/or vomiting °4. Extreme swelling or bruising °5. Continued bleeding from incision. °6. Increased pain, redness, or drainage from the incision ° °The clinic staff is available to answer your questions during regular business hours.  Please don’t hesitate to call and ask to speak to one of the nurses for clinical concerns.  If you have a medical emergency, go to the nearest emergency room or call 911.  A surgeon from [facility] is always on call at the hospital ° ° °  [address], [address], [place], [place]  [address] ? ° [address] [address], [place], [place]   [address] °[phone] ? [phone] ? FAX [phone] °Web site:  [email or web address] ° ° ° °Post Anesthesia Home Care Instructions ° °Activity: °Get plenty of rest for the remainder of the day. A responsible adult should stay with you for 24 hours following the procedure.  °For the next 24 hours, DO NOT: °-Drive a car °-Operate machinery °-Drink alcoholic beverages °-Take any medication unless instructed by your physician °-Make any legal decisions or sign important papers. ° °Meals: °Start with liquid foods such as gelatin or soup. Progress to regular foods as tolerated. Avoid greasy, spicy, heavy foods. If nausea and/or vomiting occur, drink only clear liquids until the nausea and/or vomiting subsides. Call your physician if vomiting continues. ° °Special Instructions/Symptoms: °Your throat may feel dry or sore from the anesthesia or the breathing tube placed in your throat during surgery. If this causes discomfort, gargle with warm salt water. The discomfort should disappear within 24 hours. ° °

## 2013-09-05 NOTE — Anesthesia Postprocedure Evaluation (Signed)
  Anesthesia Post-op Note  Patient: Julie Boone  Procedure(s) Performed: Procedure(s): HERNIA REPAIR UMBILICAL ADULT WITH MESH (N/A) INSERTION OF MESH (N/A)  Patient Location: PACU  Anesthesia Type:General  Level of Consciousness: awake  Airway and Oxygen Therapy: Patient Spontanous Breathing  Post-op Pain: mild  Post-op Assessment: Post-op Vital signs reviewed  Post-op Vital Signs: Reviewed  Last Vitals:  Filed Vitals:   09/05/13 1215  BP: 168/89  Pulse: 76  Temp:   Resp: 19    Complications: No apparent anesthesia complications

## 2013-09-06 ENCOUNTER — Telehealth (INDEPENDENT_AMBULATORY_CARE_PROVIDER_SITE_OTHER): Payer: Self-pay | Admitting: *Deleted

## 2013-09-06 ENCOUNTER — Encounter (HOSPITAL_BASED_OUTPATIENT_CLINIC_OR_DEPARTMENT_OTHER): Payer: Self-pay | Admitting: Surgery

## 2013-09-06 NOTE — Telephone Encounter (Signed)
LMOM for pt to call.  Advise pt her p/o app with Dr. Brantley Stage is scheduled for 09-22-13@ 10:50 and to arrive at 10:35.  Thanks!  Anderson Malta

## 2013-09-06 NOTE — Addendum Note (Signed)
Addendum created 09/06/13 8550 by Tawni Millers, CRNA   Modules edited: Charges VN

## 2013-09-07 NOTE — Telephone Encounter (Signed)
Pt's daughter, Pamala Hurry, returned my call.  She is aware of her mom's p/o appt with Dr. Brantley Stage.  She also was concerned about pt's ankles retaining fluid since surgery.  She did advise me that pt has been sleeping in a chair, that they didn't have a recliner since surgery.  I advised daughter that the fluid could be coming from her legs and feet just dangling.  I advised daughter to elevate pt's feet up and I will speak with a Dr in the office since Dr. Brantley Stage was unavailable.  I spoke with Dr. Despina Arias and he advised that it is normal for fluid retention around the feet and ankles, especially if they are not elevated when pt is sitting.  He advised to have pt elevate her feet and keep a check on the swelling and if not any better, to call the office back.  I advised daughter, and she verbalized understanding.  Anderson Malta

## 2013-09-08 ENCOUNTER — Other Ambulatory Visit: Payer: Commercial Managed Care - HMO

## 2013-09-08 ENCOUNTER — Telehealth (INDEPENDENT_AMBULATORY_CARE_PROVIDER_SITE_OTHER): Payer: Self-pay

## 2013-09-08 MED ORDER — ONDANSETRON 4 MG PO TBDP: 4.0000 mg | ORAL_TABLET | Freq: Four times a day (QID) | ORAL | Status: DC | PRN

## 2013-09-08 NOTE — Telephone Encounter (Signed)
Pt s/p umblical hernia repair on 09/05/13. Pts daughter states she has been taking her pain meds and this has been causing her to have some n/v. Pt states that she has not been eating very much as well. Advised pt that this is a side effect of pain meds when pts take on a empty stomach. Pt denies any fevers or chills at this time. Pt would like to see if Dr Brantley Stage would call her in some Zofran. Per protocol will order Zofran 4mg  1 tab q 6hrs prn #15 no refills. Daughter states that they are also going to try and stop taking the pain meds and start taking Ibuprofen.

## 2013-09-12 ENCOUNTER — Ambulatory Visit: Payer: Commercial Managed Care - HMO | Admitting: Internal Medicine

## 2013-09-15 ENCOUNTER — Encounter (INDEPENDENT_AMBULATORY_CARE_PROVIDER_SITE_OTHER): Payer: Self-pay | Admitting: Surgery

## 2013-09-22 ENCOUNTER — Encounter (INDEPENDENT_AMBULATORY_CARE_PROVIDER_SITE_OTHER): Payer: Self-pay | Admitting: Surgery

## 2013-09-22 ENCOUNTER — Ambulatory Visit (INDEPENDENT_AMBULATORY_CARE_PROVIDER_SITE_OTHER): Payer: Commercial Managed Care - HMO | Admitting: Surgery

## 2013-09-22 VITALS — BP 150/76 | HR 78 | Temp 98.5°F | Resp 18 | Ht 65.0 in | Wt 166.0 lb

## 2013-09-22 DIAGNOSIS — Z9889 Other specified postprocedural states: Secondary | ICD-10-CM

## 2013-09-22 DIAGNOSIS — R9389 Abnormal findings on diagnostic imaging of other specified body structures: Secondary | ICD-10-CM | POA: Insufficient documentation

## 2013-09-22 NOTE — Patient Instructions (Signed)
Lift 15 lbs or less for 4 more weeks.  Get CT done of chest to follow up CXR.  Return as needed.

## 2013-09-22 NOTE — Progress Notes (Signed)
Pt returns today after umbilical  hernia repair with mesh.  Pain is well controlled.  Bowels are functioning.  Wound is clean.  On exam:  Incision is clean /dry/intact.  Area is soft without signs of hernia recurrence.  Impression:  Status repair of hernia umbilical  Abnormal CXR  Plan:  RTC PRN  GETTING CHEST CT NEXT WEEK.   LIFT 15 LBS FOR 4 MORE WEEKS THEN NO RESTRICTION.

## 2013-09-25 ENCOUNTER — Ambulatory Visit
Admission: RE | Admit: 2013-09-25 | Discharge: 2013-09-25 | Disposition: A | Discharge status: Home / Self Care | Payer: Commercial Managed Care - HMO | Source: Ambulatory Visit | Attending: Surgery | Admitting: Surgery

## 2013-09-25 MED ORDER — IOHEXOL 300 MG/ML  SOLN
75.0000 mL | Freq: Once | INTRAMUSCULAR | Status: AC | PRN
  Administered 2013-09-25: 75 mL via INTRAVENOUS

## 2013-09-29 ENCOUNTER — Telehealth: Payer: Self-pay | Admitting: Internal Medicine

## 2013-09-29 NOTE — Telephone Encounter (Signed)
Pt states she mentioned tp Dr Regis Bill at last visit about feet and leg pain in the morning when she gets out of bed. The pain so bad in the am that it makes her cry.. Feet feel like they are on fire.  Eventually, pain goes away, but it takes it a while. Pt had hernia surgery on 8/7.  Pain has been going on several years, just gotten worse. Pt would like referral to neuro md. pls advise

## 2013-09-29 NOTE — Telephone Encounter (Signed)
i agree with neurology referral  Please pu in referral

## 2013-10-02 ENCOUNTER — Other Ambulatory Visit: Payer: Self-pay | Admitting: Family Medicine

## 2013-10-02 DIAGNOSIS — M25579 Pain in unspecified ankle and joints of unspecified foot: Secondary | ICD-10-CM

## 2013-10-02 DIAGNOSIS — M25569 Pain in unspecified knee: Secondary | ICD-10-CM

## 2013-10-02 NOTE — Telephone Encounter (Signed)
Order placed in the system. 

## 2013-10-03 ENCOUNTER — Other Ambulatory Visit (INDEPENDENT_AMBULATORY_CARE_PROVIDER_SITE_OTHER): Payer: Self-pay

## 2013-10-03 ENCOUNTER — Telehealth (INDEPENDENT_AMBULATORY_CARE_PROVIDER_SITE_OTHER): Payer: Self-pay

## 2013-10-03 DIAGNOSIS — R918 Other nonspecific abnormal finding of lung field: Secondary | ICD-10-CM

## 2013-10-03 NOTE — Telephone Encounter (Signed)
Message copied by Carlene Coria on Tue Oct 03, 2013  2:00 PM ------      Message from: Turner Daniels      Created: Tue Oct 03, 2013 12:17 PM      Contact: [phone]       Some scaring noted.  Recommend repeating in 6 months to ensure stability.       ----- Message -----         From: Carlene Coria, CMA         Sent: 10/03/2013  12:02 PM           To: Thomas A. Cornett, MD            Can you review pts chest CT so I can let her know what you think.      Thx       ----- Message -----         From: Avel Sensor         Sent: 10/03/2013  11:25 AM           To: Carlene Coria, CMA            PLEASE CALL HER WITH HER TEST RESULTS             ------

## 2013-10-03 NOTE — Telephone Encounter (Signed)
Pt called and was given results.  She will wait to hear from [place] in 6 months.

## 2013-10-03 NOTE — Telephone Encounter (Signed)
Tried going over results with patient but they were shopping and could not hear me. They will call back to get results. Please let pt know chest CT only showed some scarring but recommend a 6 month repeat to make sure it is stable. We will put order in and call her to schedule the 6 month f/u CT.

## 2013-10-12 ENCOUNTER — Telehealth: Payer: Self-pay | Admitting: Internal Medicine

## 2013-10-12 MED ORDER — ATENOLOL 50 MG PO TABS: ORAL_TABLET | ORAL | Status: DC

## 2013-10-12 NOTE — Telephone Encounter (Signed)
Lillian, [facility] [address] is requesting re-fill on atenolol (TENORMIN) 50 MG tablet

## 2013-10-12 NOTE — Telephone Encounter (Signed)
Sent by e-scribe. 

## 2013-10-30 ENCOUNTER — Telehealth: Payer: Self-pay | Admitting: *Deleted

## 2013-10-30 NOTE — Telephone Encounter (Signed)
Patient cancelled her new patient appointment she states that she feels a lot better and does not need to come in now

## 2013-11-14 ENCOUNTER — Telehealth: Payer: Self-pay | Admitting: Neurology

## 2013-11-14 ENCOUNTER — Ambulatory Visit: Payer: Commercial Managed Care - HMO | Admitting: Neurology

## 2013-11-14 NOTE — Telephone Encounter (Signed)
Pt cancelled today's NP appt w/ Dr. Posey Pronto stating she is feeling better. Referring office notified via EPIC referral / Sherri S.

## 2013-11-21 ENCOUNTER — Encounter: Payer: Self-pay | Admitting: Physician Assistant

## 2013-11-21 ENCOUNTER — Ambulatory Visit (INDEPENDENT_AMBULATORY_CARE_PROVIDER_SITE_OTHER): Payer: Commercial Managed Care - HMO | Admitting: Physician Assistant

## 2013-11-21 VITALS — BP 130/80 | HR 60 | Temp 98.0°F | Resp 18 | Wt 173.0 lb

## 2013-11-21 DIAGNOSIS — H6120 Impacted cerumen, unspecified ear: Secondary | ICD-10-CM

## 2013-11-21 DIAGNOSIS — H6123 Impacted cerumen, bilateral: Secondary | ICD-10-CM

## 2013-11-21 NOTE — Progress Notes (Signed)
Pre visit review using our clinic review tool, if applicable. No additional management support is needed unless otherwise documented below in the visit note. 

## 2013-11-21 NOTE — Progress Notes (Signed)
Subjective:    Patient ID: Julie Boone, female    DOB: [birth year], 80 y.o.   MRN: [id number]  HPI Patient is a 80 y.o. female presenting for wax buildup. Patient says that she has a history of cerumen impaction requiring office lavage. Patient states that she is unsure how long her ears have been clogged with this episode. Patient admits to having decreased hearing. Patient was at cardiologist office earlier today for hearing aid fitting, and was told to calm to primary care to have her ears lavaged. Patient denies fevers, chills, nausea, vomiting, diarrhea, shortness of breath, chest pain, headache, dizziness, syncope.   Review of Systems As per HPI and are otherwise negative.   Past Medical History  Diagnosis Date  . Breast cancer     mastectomy 3   neg ln   . HTN (hypertension)   . Arthritis   . Wears hearing aid     both ears  . HOH (hard of hearing)   . Wears glasses     History   Social History  . Marital Status: Married    Spouse Name: N/A    Number of Children: N/A  . Years of Education: N/A   Occupational History  . Not on file.   Social History Main Topics  . Smoking status: Never Smoker   . Smokeless tobacco: Never Used  . Alcohol Use: Yes     Comment: occ  . Drug Use: No  . Sexual Activity: Not on file   Other Topics Concern  . Not on file   Social History Narrative   Usually receives 5 hours of sleep per night   3 people living in the home   Puppy  [place] and [place]   From [place] moved from [place]  Adopted daughter nearby.      Neg ets  Wine with meals  No tob rd.   G0P0   Married husband [place] background  [place] descent.             Past Surgical History  Procedure Laterality Date  . Appendectomy  2006  . Tonsillectomy and adenoidectomy      Childhood  . Colonoscopy    . Breast biopsy  2008    left mast  . Umbilical hernia repair N/A 09/05/2013    Procedure: HERNIA REPAIR UMBILICAL ADULT WITH MESH;  Surgeon:  Joyice Faster. Cornett, MD;  Location: [facility];  Service: General;  Laterality: N/A;  . Insertion of mesh N/A 09/05/2013    Procedure: INSERTION OF MESH;  Surgeon: Joyice Faster. Cornett, MD;  Location: [facility];  Service: General;  Laterality: N/A;  . Hernia repair      Family History  Problem Relation Age of Onset  . Hypertension Mother   . Stroke Mother   . Heart failure Mother   . Cancer Sister     lung   . Colon cancer Father     No Known Allergies  Current Outpatient Prescriptions on File Prior to Visit  Medication Sig Dispense Refill  . aspirin 81 MG tablet Take 81 mg by mouth as needed for pain.      [name] atenolol (TENORMIN) 50 MG tablet TAKE 1 TABLET BY MOUTH EVERY DAY  90 tablet  2  . CALCIUM-MAG-VIT C-VIT D PO Take by mouth daily.      . Coenzyme Q10 (CO Q 10 PO) Take 300 mg by mouth 3 (three) times daily.      [name]  HYDROcodone-acetaminophen (NORCO) 5-325 MG per tablet Take 1 tablet by mouth every 6 (six) hours as needed for moderate pain.  30 tablet  0  . lisinopril (PRINIVIL,ZESTRIL) 40 MG tablet Take 1 tablet (40 mg total) by mouth daily.  90 tablet  1  . ondansetron (ZOFRAN ODT) 4 MG disintegrating tablet Take 1 tablet (4 mg total) by mouth every 6 (six) hours as needed for nausea or vomiting.  15 tablet  0  . vitamin C (ASCORBIC ACID) 500 MG tablet Take 500 mg by mouth 3 (three) times daily.       No current facility-administered medications on file prior to visit.    EXAM: BP 130/80  Pulse 60  Temp(Src) 98 F (36.7 C) (Oral)  Resp 18  Wt 173 lb (78.472 kg)     Objective:   Physical Exam  Nursing note and vitals reviewed. Constitutional: She is oriented to person, place, and time. She appears well-developed and well-nourished. No distress.  HENT:  Head: Normocephalic and atraumatic.  Bilateral ear canals cerumen impaction, lavaged in office. Bilateral TMs normal.  Eyes: Conjunctivae and EOM are normal.  Cardiovascular: Normal rate,  regular rhythm and intact distal pulses.   Pulmonary/Chest: Effort normal and breath sounds normal. No respiratory distress.  Neurological: She is alert and oriented to person, place, and time.  Skin: Skin is warm and dry. No rash noted. She is not diaphoretic. No erythema. No pallor.  Psychiatric: She has a normal mood and affect. Her behavior is normal. Judgment and thought content normal.     Lab Results  Component Value Date   WBC 8.6 08/29/2013   HGB 13.8 08/29/2013   HCT 42.0 08/29/2013   PLT 208 08/29/2013   GLUCOSE 106* 08/29/2013   ALT 20 08/29/2013   AST 22 08/29/2013   NA 142 08/29/2013   K 5.1 08/29/2013   CL 104 08/29/2013   CREATININE 1.01 08/29/2013   BUN 25* 08/29/2013   CO2 25 08/29/2013   TSH 1.34 04/07/2013   HGBA1C 6.0 04/07/2013        Assessment & Plan:  Julie Boone was seen today for cerumen impaction.  Diagnoses and associated orders for this visit:  Cerumen impaction, bilateral Comments: Lavage in office. Debrox solution at home to prevent recurrence.    Return precautions provided, and patient handout on cerumen impaction.  Plan to follow up as needed, or for worsening or persistent symptoms despite treatment.  Patient Instructions  Try using the Debrox solution as discussed and to prevent recurrence of ear wax buildup.  If emergency symptoms discussed during visit developed, seek medical attention immediately.  Followup as needed, or for worsening or persistent symptoms despite treatment.

## 2013-11-21 NOTE — Patient Instructions (Addendum)
Try using the Debrox solution as discussed and to prevent recurrence of ear wax buildup.  If emergency symptoms discussed during visit developed, seek medical attention immediately.  Followup as needed, or for worsening or persistent symptoms despite treatment.    Cerumen Impaction A cerumen impaction is when the wax in your ear forms a plug. This plug usually causes reduced hearing. Sometimes it also causes an earache or dizziness. Removing a cerumen impaction can be difficult and painful. The wax sticks to the ear canal. The canal is sensitive and bleeds easily. If you try to remove a heavy wax buildup with a cotton tipped swab, you may push it in further. Irrigation with water, suction, and small ear curettes may be used to clear out the wax. If the impaction is fixed to the skin in the ear canal, ear drops may be needed for a few days to loosen the wax. People who build up a lot of wax frequently can use ear wax removal products available in your local drugstore. SEEK MEDICAL CARE IF:  You develop an earache, increased hearing loss, or marked dizziness. Document Released: 03/26/2004 Document Revised: 05/11/2011 Document Reviewed: 05/16/2009 [facility] Patient Information 2015 [place], [place]. This information is not intended to replace advice given to you by your health care provider. Make sure you discuss any questions you have with your health care provider.

## 2013-12-04 ENCOUNTER — Other Ambulatory Visit: Payer: Self-pay

## 2013-12-04 DIAGNOSIS — Z1239 Encounter for other screening for malignant neoplasm of breast: Secondary | ICD-10-CM

## 2014-01-22 ENCOUNTER — Ambulatory Visit (INDEPENDENT_AMBULATORY_CARE_PROVIDER_SITE_OTHER): Payer: Commercial Managed Care - HMO | Admitting: Family Medicine

## 2014-01-22 ENCOUNTER — Ambulatory Visit: Payer: Commercial Managed Care - HMO

## 2014-01-22 ENCOUNTER — Encounter: Payer: Self-pay | Admitting: Family Medicine

## 2014-01-22 VITALS — BP 124/80 | Temp 98.8°F | Wt 173.0 lb

## 2014-01-22 DIAGNOSIS — I10 Essential (primary) hypertension: Secondary | ICD-10-CM

## 2014-01-22 DIAGNOSIS — M26629 Arthralgia of temporomandibular joint, unspecified side: Secondary | ICD-10-CM

## 2014-01-22 DIAGNOSIS — M2669 Other specified disorders of temporomandibular joint: Secondary | ICD-10-CM

## 2014-01-22 DIAGNOSIS — R252 Cramp and spasm: Secondary | ICD-10-CM

## 2014-01-22 DIAGNOSIS — Z853 Personal history of malignant neoplasm of breast: Secondary | ICD-10-CM

## 2014-01-22 MED ORDER — LISINOPRIL 40 MG PO TABS: 40.0000 mg | ORAL_TABLET | Freq: Every day | ORAL | Status: DC

## 2014-01-22 NOTE — Progress Notes (Signed)
Pre visit review using our clinic review tool, if applicable. No additional management support is needed unless otherwise documented below in the visit note. 

## 2014-01-22 NOTE — Progress Notes (Signed)
   Subjective:    Patient ID: Julie Boone, female    DOB: 05/13/1924, 80 y.o.   MRN: [id number]  HPI Julie Boone is a 80 year old married female nonsmoker who comes in today for evaluation of bilateral ear pain  She states she had her ears flushed out here 4 weeks ago in 3 weeks ago she began having pain in her ears. No fever chills etc.   Review of Systems    review of systems otherwise negative Objective:   Physical Exam  Well-developed well-nourished female no acute distress vital signs stable she's afebrile HEENT were negative neck was supple no adenopathy is bilateral tenderness in both TMJs      Assessment & Plan:  TMJ syndrome.......... soft diet.......[name] Motrin 400 mg twice daily  Mouth guard  Return when necessary

## 2014-01-22 NOTE — Patient Instructions (Signed)
Soft diet;;;;; nothing that you have to chew  Motrin 400 mg twice daily with food  Mouth guard........... omega sports  Return when necessary

## 2014-02-09 ENCOUNTER — Ambulatory Visit
Admission: RE | Admit: 2014-02-09 | Discharge: 2014-02-09 | Disposition: A | Discharge status: Home / Self Care | Payer: Commercial Managed Care - HMO | Source: Ambulatory Visit

## 2014-02-09 DIAGNOSIS — Z1239 Encounter for other screening for malignant neoplasm of breast: Secondary | ICD-10-CM

## 2014-02-14 ENCOUNTER — Telehealth: Payer: Self-pay

## 2014-02-14 NOTE — Telephone Encounter (Signed)
Patient has made appt for 02/15/14

## 2014-02-14 NOTE — Telephone Encounter (Signed)
Patient's daughter called to schedule an appointment. 02/15/14

## 2014-02-14 NOTE — Telephone Encounter (Signed)
She may need to see dentist or ent   Not a new problem

## 2014-02-14 NOTE — Telephone Encounter (Signed)
[facility] - Client [facility] Patient Name: Julie Boone Gender: Female DOB: 1924/11/20 Age: 80 Y 10 M 22 D Return Phone Number: [phone] (Primary) Address: [address] City/State/Zip: [place] [address] Client [facility] - Client Client Site [facility] - Day Physician [name] Contact Type Call Call Type Triage / Miner Name Pamala Hurry Relationship To Patient Daughter Return Phone Number [phone] (Primary) Chief Complaint Mouth Symptoms Initial Comment caller states mother has a severe pain in her jaw that radiates into her ear PreDisposition InappropriateToAsk Nurse Assessment Nurse: Gretta Cool, RN, Byrd Hesselbach Date/Time Eilene Ghazi Time): 02/14/2014 8:21:39 AM Confirm and document reason for call. If symptomatic, describe symptoms. ---caller states mother has a severe pain in her jaw that radiates into her ear. Pain started a while ago and has gotten worse. Has seen the Dr. and was told to get mouth sports guard and has not helped. No swelling. Started after having ears flushed in the office. Pt can eat and swallow. Has the patient traveled out of the country within the last 30 days? ---Not Applicable Does the patient require triage? ---Yes Related visit to physician within the last 2 weeks? ---No Does the PT have any chronic conditions? (i.e. diabetes, asthma, etc.) ---Yes List chronic conditions. ---htn Guidelines Guideline Title Affirmed Question Affirmed Notes Nurse Date/Time Eilene Ghazi Time) Mouth Pain [1] MILD-MODERATE mouth pain AND [2] present > 3 days Lavella Hammock 02/14/2014 8:23:42 AM Disp. Time Eilene Ghazi Time) Disposition Final User 02/14/2014 8:16:15 AM Send To Clinical Follow Up Queue [facility] 02/14/2014 8:27:51 AM See PCP When Office is Open (within 3 days) Yes Gretta Cool, RN, Byrd Hesselbach Caller Understands:  Yes PLEASE NOTE: All timestamps contained within this report are represented as [place] Standard Time. CONFIDENTIALTY NOTICE: This fax transmission is intended only for the addressee. It contains information that is legally privileged, confidential or otherwise protected from use or disclosure. If you are not the intended recipient, you are strictly prohibited from reviewing, disclosing, copying using or disseminating any of this information or taking any action in reliance on or regarding this information. If you have received this fax in error, please notify us immediately by telephone so that we can arrange for its return to [place]. Phone: [phone], Toll-Free: [phone], Fax: [phone] Page: 2 of 2 Call Id: [id number] Disagree/Comply: Comply Care Advice Given Per Guideline SEE PCP WITHIN 3 DAYS: You need to be examined within 2 or 3 days. Call your doctor during regular office hours and make an appointment. (Note: if office will be open tomorrow, tell caller to call then, not in 3 days). SALT AND SODA MOUTHWASH: * You can make a good mouthwash yourself. Place 1/2 tsp of salt plus 1/2 tsp of baking soda in a cup of warm water. DRINK LIQUIDS: Drink your favorite fluids to prevent dehydration. Cold drinks, milkshakes and popsicles are especially good. CALL BACK IF: * Difficulty with swallowing occurs (e.g., can't swallow or drooling) * Difficulty with breathing occurs * You become worse. CARE ADVICE given per Mouth Pain (Adult) guideline. After Care Instructions Given Call Event Type User Date / Time Description

## 2014-02-15 ENCOUNTER — Encounter: Payer: Self-pay | Admitting: Internal Medicine

## 2014-02-15 ENCOUNTER — Ambulatory Visit (INDEPENDENT_AMBULATORY_CARE_PROVIDER_SITE_OTHER): Payer: Commercial Managed Care - HMO | Admitting: Internal Medicine

## 2014-02-15 VITALS — BP 170/70 | Temp 98.5°F | Ht 65.0 in | Wt 172.9 lb

## 2014-02-15 DIAGNOSIS — R6884 Jaw pain: Secondary | ICD-10-CM

## 2014-02-15 DIAGNOSIS — R42 Dizziness and giddiness: Secondary | ICD-10-CM

## 2014-02-15 DIAGNOSIS — H9201 Otalgia, right ear: Secondary | ICD-10-CM

## 2014-02-15 NOTE — Progress Notes (Signed)
Pre visit review using our clinic review tool, if applicable. No additional management support is needed unless otherwise documented below in the visit note.   Chief Complaint  Patient presents with  . Ear Pain    Rt.side.  Hears a clicking noise when she is chewing her food  . Jaw Pain    HPI: Patient Julie Boone  comes in today for SDA for  worseing  problem evaluation. Here with Husband  Onset about 6 weeks ago after self right ear irrigation right and then got dizziness and ear pain .  Right  Worse after that wax and wanesx  Then   Saw dr todd dx tmj   Told to take tylenol  But  Worse yestereday and couldn't colseo mouth    And severe right  pain down jaw line  Also Dizziness unsteadiness  New counds in ears for years  Has hearing aids not here today  Took some left over antivert no help  Has left over vicodin at home  Didn't take it  ROS: See pertinent positives and negatives per HPI. No fever falling vision change   bp has been ok   Past Medical History  Diagnosis Date  . Breast cancer     mastectomy 3   neg ln   . HTN (hypertension)   . Arthritis   . Wears hearing aid     both ears  . HOH (hard of hearing)   . Wears glasses     Family History  Problem Relation Age of Onset  . Hypertension Mother   . Stroke Mother   . Heart failure Mother   . Cancer Sister     lung   . Colon cancer Father     History   Social History  . Marital Status: Married    Spouse Name: N/A    Number of Children: N/A  . Years of Education: N/A   Social History Main Topics  . Smoking status: Never Smoker   . Smokeless tobacco: Never Used  . Alcohol Use: Yes     Comment: occ  . Drug Use: No  . Sexual Activity: None   Other Topics Concern  . None   Social History Narrative   Usually receives 5 hours of sleep per night   3 people living in the home   [place]   From [place] moved from [place]  Adopted daughter nearby.      Neg ets  Wine with meals  No  tob rd.   [id number]   Married husband [place] background  H[place]elf [place] descent.             Outpatient Encounter Prescriptions as of 02/15/2014  Medication Sig  . aspirin 81 MG tablet Take 81 mg by mouth as needed for pain.  [name] atenolol (TENORMIN) 50 MG tablet TAKE 1 TABLET BY MOUTH EVERY DAY  . CALCIUM-MAG-VIT C-VIT D PO Take by mouth daily.  . Coenzyme Q10 (CO Q 10 PO) Take 300 mg by mouth 3 (three) times daily.  [name] GLUCOSAMINE-CHONDROITIN PO Take by mouth.  [name] HYDROcodone-acetaminophen (NORCO) 5-325 MG per tablet Take 1 tablet by mouth every 6 (six) hours as needed for moderate pain.  [name] lisinopril (PRINIVIL,ZESTRIL) 40 MG tablet Take 1 tablet (40 mg total) by mouth daily.  . LUTEIN PO Take by mouth.  . meclizine (ANTIVERT) 25 MG tablet Take 25 mg by mouth 4 (four) times daily as needed for dizziness.  . ondansetron (  ZOFRAN ODT) 4 MG disintegrating tablet Take 1 tablet (4 mg total) by mouth every 6 (six) hours as needed for nausea or vomiting.  . vitamin C (ASCORBIC ACID) 500 MG tablet Take 500 mg by mouth 3 (three) times daily.    EXAM:  BP 170/70 mmHg  Temp(Src) 98.5 F (36.9 C) (Oral)  Ht 5\' 5"  (1.651 m)  Wt 172 lb 14.4 oz (78.427 kg)  BMI 28.77 kg/m2  Body mass index is 28.77 kg/(m^2).  GENERAL: vitals reviewed and listed above, alert, oriented, appears well hydrated and in no acute distress very hard of hearing  Some discomfort  HEENT: atraumatic, conjunctiva  clear, no obvious abnormalities on inspection of external nose and ears   eac no wax tm ? Retracted seems intact  Some tedneress ext canal  Neg tmj click  OP : no lesion edema or exudate  No teeth pain obvious NECK: no obvious masses on inspection palpation  LUNGS: clear to auscultation bilaterally, no wheezes, rales or rhonchi,  CV: HRRR, no clubbing cyanosis or  peripheral edema nl cap refill  MS: moves all extremities without noticeable focal  Abnormality  Gait slightly unsterady  Neuro non focal  Cognition intact    ASSESSMENT AND PLAN:  Discussed the following assessment and plan:  Ear pain, right - Plan: Ambulatory referral to ENT  Dizziness - Plan: Ambulatory referral to ENT  Jaw pain - Plan: Ambulatory referral to ENT Concern about the pain and onset of dizzinesss dont see perf   ? If could have menieres .? dont see a perf  Certain has some tmj sx  And acts like a temporary disclocation   She should make appt with dentist and we will do ent about the ear pain and dizziness to check for other causes .   -Patient advised to return or notify health care team  if symptoms worsen ,persist or new concerns arise. bp elevaetd today thought from the pain as it has been controlled       BP Readings from Last 3 Encounters:  02/15/14 170/70  01/22/14 124/80  11/21/13 130/80    Lab Results  Component Value Date   WBC 8.6 08/29/2013   HGB 13.8 08/29/2013   HCT 42.0 08/29/2013   PLT 208 08/29/2013   GLUCOSE 106* 08/29/2013   ALT 20 08/29/2013   AST 22 08/29/2013   NA 142 08/29/2013   K 5.1 08/29/2013   CL 104 08/29/2013   CREATININE 1.01 08/29/2013   BUN 25* 08/29/2013   CO2 25 08/29/2013   TSH 1.34 04/07/2013   HGBA1C 6.0 04/07/2013     Patient Instructions  This acts like  TMJ possible dislocation yesterday when couldn't close   Mouth  Together .   Will have you see  ent cause of the pain and dizziness after the ear irrigation.    Get appt with your dentist   asap  May need to see an oral surgeon also   Can take  Pain  Pill if needed  And tylenol not helping.   Avoid  chewing ice gum and opening mouth wide  In the mean  Time .   Temporomandibular Problems  Temporomandibular joint (TMJ) dysfunction means there are problems with the joint between your jaw and your skull. This is a joint lined by cartilage like other joints in your body but also has a small disc in the joint which keeps the bones from rubbing on each other. These joints are like other joints and can  get inflamed (sore)  from arthritis and other problems. When this joint gets sore, it can cause headaches and pain in the jaw and the face. CAUSES  Usually the arthritic types of problems are caused by soreness in the joint. Soreness in the joint can also be caused by overuse. This may come from grinding your teeth. It may also come from mis-alignment in the joint. DIAGNOSIS Diagnosis of this condition can often be made by history and exam. Sometimes your caregiver may need X-rays or an MRI scan to determine the exact cause. It may be necessary to see your dentist to determine if your teeth and jaws are lined up correctly. TREATMENT  Most of the time this problem is not serious; however, sometimes it can persist (become chronic). When this happens medications that will cut down on inflammation (soreness) help. Sometimes a shot of cortisone into the joint will be helpful. If your teeth are not aligned it may help for your dentist to make a splint for your mouth that can help this problem. If no physical problems can be found, the problem may come from tension. If tension is found to be the cause, biofeedback or relaxation techniques may be helpful. HOME CARE INSTRUCTIONS   Later in the day, applications of ice packs may be helpful. Ice can be used in a plastic bag with a towel around it to prevent frostbite to skin. This may be used about every 2 hours for 20 to 30 minutes, as needed while awake, or as directed by your caregiver.  Only take over-the-counter or prescription medicines for pain, discomfort, or fever as directed by your caregiver.  If physical therapy was prescribed, follow your caregiver's directions.  Wear mouth appliances as directed if they were given. Document Released: 11/11/2000 Document Revised: 05/11/2011 Document Reviewed: 02/19/2008 [facility] Patient Information 2015 [place], [place]. This information is not intended to replace advice given to you by your health care provider. Make sure you discuss  any questions you have with your health care provider.      Standley Brooking. Panosh M.D.

## 2014-02-15 NOTE — Patient Instructions (Addendum)
This acts like  TMJ possible dislocation yesterday when couldn't close   Mouth  Together .   Will have you see  ent cause of the pain and dizziness after the ear irrigation.    Get appt with your dentist   asap  May need to see an oral surgeon also   Can take  Pain  Pill if needed  And tylenol not helping.   Avoid  chewing ice gum and opening mouth wide  In the mean  Time .   Temporomandibular Problems  Temporomandibular joint (TMJ) dysfunction means there are problems with the joint between your jaw and your skull. This is a joint lined by cartilage like other joints in your body but also has a small disc in the joint which keeps the bones from rubbing on each other. These joints are like other joints and can get inflamed (sore) from arthritis and other problems. When this joint gets sore, it can cause headaches and pain in the jaw and the face. CAUSES  Usually the arthritic types of problems are caused by soreness in the joint. Soreness in the joint can also be caused by overuse. This may come from grinding your teeth. It may also come from mis-alignment in the joint. DIAGNOSIS Diagnosis of this condition can often be made by history and exam. Sometimes your caregiver may need X-rays or an MRI scan to determine the exact cause. It may be necessary to see your dentist to determine if your teeth and jaws are lined up correctly. TREATMENT  Most of the time this problem is not serious; however, sometimes it can persist (become chronic). When this happens medications that will cut down on inflammation (soreness) help. Sometimes a shot of cortisone into the joint will be helpful. If your teeth are not aligned it may help for your dentist to make a splint for your mouth that can help this problem. If no physical problems can be found, the problem may come from tension. If tension is found to be the cause, biofeedback or relaxation techniques may be helpful. HOME CARE INSTRUCTIONS   Later in the day,  applications of ice packs may be helpful. Ice can be used in a plastic bag with a towel around it to prevent frostbite to skin. This may be used about every 2 hours for 20 to 30 minutes, as needed while awake, or as directed by your caregiver.  Only take over-the-counter or prescription medicines for pain, discomfort, or fever as directed by your caregiver.  If physical therapy was prescribed, follow your caregiver's directions.  Wear mouth appliances as directed if they were given. Document Released: 11/11/2000 Document Revised: 05/11/2011 Document Reviewed: 02/19/2008 [facility] Patient Information 2015 [place], [place]. This information is not intended to replace advice given to you by your health care provider. Make sure you discuss any questions you have with your health care provider.

## 2014-02-18 ENCOUNTER — Emergency Department (HOSPITAL_COMMUNITY): Payer: Commercial Managed Care - HMO

## 2014-02-18 ENCOUNTER — Encounter (HOSPITAL_COMMUNITY): Payer: Self-pay | Admitting: Emergency Medicine

## 2014-02-18 ENCOUNTER — Emergency Department (HOSPITAL_COMMUNITY)
Admission: EM | Admit: 2014-02-18 | Discharge: 2014-02-19 | Disposition: A | Discharge status: Home / Self Care | Payer: Commercial Managed Care - HMO | Attending: Emergency Medicine | Admitting: Emergency Medicine

## 2014-02-18 DIAGNOSIS — Z9049 Acquired absence of other specified parts of digestive tract: Secondary | ICD-10-CM | POA: Insufficient documentation

## 2014-02-18 DIAGNOSIS — Z9889 Other specified postprocedural states: Secondary | ICD-10-CM | POA: Insufficient documentation

## 2014-02-18 DIAGNOSIS — Z79899 Other long term (current) drug therapy: Secondary | ICD-10-CM | POA: Diagnosis not present

## 2014-02-18 DIAGNOSIS — R109 Unspecified abdominal pain: Secondary | ICD-10-CM

## 2014-02-18 DIAGNOSIS — Z974 Presence of external hearing-aid: Secondary | ICD-10-CM | POA: Diagnosis not present

## 2014-02-18 DIAGNOSIS — H9190 Unspecified hearing loss, unspecified ear: Secondary | ICD-10-CM | POA: Diagnosis not present

## 2014-02-18 DIAGNOSIS — R079 Chest pain, unspecified: Secondary | ICD-10-CM

## 2014-02-18 DIAGNOSIS — R42 Dizziness and giddiness: Secondary | ICD-10-CM | POA: Diagnosis not present

## 2014-02-18 DIAGNOSIS — R1010 Upper abdominal pain, unspecified: Secondary | ICD-10-CM | POA: Insufficient documentation

## 2014-02-18 DIAGNOSIS — R112 Nausea with vomiting, unspecified: Secondary | ICD-10-CM | POA: Diagnosis not present

## 2014-02-18 DIAGNOSIS — Z973 Presence of spectacles and contact lenses: Secondary | ICD-10-CM | POA: Insufficient documentation

## 2014-02-18 DIAGNOSIS — M1990 Unspecified osteoarthritis, unspecified site: Secondary | ICD-10-CM | POA: Diagnosis not present

## 2014-02-18 DIAGNOSIS — Z7952 Long term (current) use of systemic steroids: Secondary | ICD-10-CM | POA: Insufficient documentation

## 2014-02-18 DIAGNOSIS — Z853 Personal history of malignant neoplasm of breast: Secondary | ICD-10-CM | POA: Diagnosis not present

## 2014-02-18 MED ORDER — ONDANSETRON HCL 4 MG/2ML IJ SOLN
4.0000 mg | Freq: Once | INTRAMUSCULAR | Status: AC
  Administered 2014-02-19: 4 mg via INTRAVENOUS
  Filled 2014-02-18: qty 2

## 2014-02-18 NOTE — ED Notes (Signed)
Bed: WA13 Expected date:  Expected time:  Means of arrival:  Comments: Triage 2 

## 2014-02-18 NOTE — ED Notes (Addendum)
Pt states she has been having dizziness for the last two weeks. Pt has been seen by her PCP and an ENT r/t the dizziness. Pt tonight became dizzy after dinner with severe chest and abdominal pain. Pt also c/o n/v, lightheadedness, weakness, & diaphoresis. Pt states chest pain has subsided and she in now only having ABD pain.

## 2014-02-19 LAB — COMPREHENSIVE METABOLIC PANEL
ALT: 21 U/L (ref 0–35)
ANION GAP: 21 — AB (ref 5–15)
AST: 24 U/L (ref 0–37)
Albumin: 4.3 g/dL (ref 3.5–5.2)
Alkaline Phosphatase: 97 U/L (ref 39–117)
BUN: 35 mg/dL — AB (ref 6–23)
CALCIUM: 10.4 mg/dL (ref 8.4–10.5)
CO2: 18 meq/L — AB (ref 19–32)
CREATININE: 1.01 mg/dL (ref 0.50–1.10)
Chloride: 100 mEq/L (ref 96–112)
GFR, EST AFRICAN AMERICAN: 56 mL/min — AB (ref >90)
GFR, EST NON AFRICAN AMERICAN: 49 mL/min — AB (ref >90)
Glucose, Bld: 201 mg/dL — ABNORMAL HIGH (ref 70–99)
Potassium: 4.4 mEq/L (ref 3.7–5.3)
Sodium: 139 mEq/L (ref 137–147)
Total Bilirubin: 0.6 mg/dL (ref 0.3–1.2)
Total Protein: 7.6 g/dL (ref 6.0–8.3)

## 2014-02-19 LAB — URINALYSIS, ROUTINE W REFLEX MICROSCOPIC
Bilirubin Urine: NEGATIVE
Glucose, UA: NEGATIVE mg/dL
HGB URINE DIPSTICK: NEGATIVE
Ketones, ur: 40 mg/dL — AB
Leukocytes, UA: NEGATIVE
Nitrite: NEGATIVE
PH: 6 (ref 5.0–8.0)
Protein, ur: NEGATIVE mg/dL
Specific Gravity, Urine: 1.026 (ref 1.005–1.030)
Urobilinogen, UA: 0.2 mg/dL (ref 0.0–1.0)

## 2014-02-19 LAB — CBC WITH DIFFERENTIAL/PLATELET
BASOS ABS: 0 10*3/uL (ref 0.0–0.1)
Basophils Relative: 0 % (ref 0–1)
EOS PCT: 0 % (ref 0–5)
Eosinophils Absolute: 0.1 10*3/uL (ref 0.0–0.7)
HCT: 45.7 % (ref 36.0–46.0)
Hemoglobin: 15.7 g/dL — ABNORMAL HIGH (ref 12.0–15.0)
LYMPHS PCT: 10 % — AB (ref 12–46)
Lymphs Abs: 1.6 10*3/uL (ref 0.7–4.0)
MCH: 33.3 pg (ref 26.0–34.0)
MCHC: 34.4 g/dL (ref 30.0–36.0)
MCV: 96.8 fL (ref 78.0–100.0)
MONO ABS: 0.6 10*3/uL (ref 0.1–1.0)
MONOS PCT: 4 % (ref 3–12)
Neutro Abs: 14.3 10*3/uL — ABNORMAL HIGH (ref 1.7–7.7)
Neutrophils Relative %: 86 % — ABNORMAL HIGH (ref 43–77)
Platelets: 175 10*3/uL (ref 150–400)
RBC: 4.72 MIL/uL (ref 3.87–5.11)
RDW: 12.5 % (ref 11.5–15.5)
WBC: 16.6 10*3/uL — AB (ref 4.0–10.5)

## 2014-02-19 LAB — I-STAT TROPONIN, ED
TROPONIN I, POC: 0.01 ng/mL (ref 0.00–0.08)
TROPONIN I, POC: 0.01 ng/mL (ref 0.00–0.08)

## 2014-02-19 LAB — LIPASE, BLOOD: LIPASE: 54 U/L (ref 11–59)

## 2014-02-19 MED ORDER — ONDANSETRON HCL 4 MG PO TABS: 4.0000 mg | ORAL_TABLET | Freq: Three times a day (TID) | ORAL | Status: DC | PRN

## 2014-02-19 MED ORDER — MECLIZINE HCL 25 MG PO TABS
25.0000 mg | ORAL_TABLET | Freq: Once | ORAL | Status: AC
  Administered 2014-02-19: 25 mg via ORAL
  Filled 2014-02-19: qty 1

## 2014-02-19 MED ORDER — MECLIZINE HCL 25 MG PO TABS: 25.0000 mg | ORAL_TABLET | Freq: Four times a day (QID) | ORAL | Status: DC | PRN

## 2014-02-19 MED ORDER — SODIUM CHLORIDE 0.9 % IV BOLUS (SEPSIS)
800.0000 mL | Freq: Once | INTRAVENOUS | Status: AC
  Administered 2014-02-19: 800 mL via INTRAVENOUS

## 2014-02-19 MED ORDER — SODIUM CHLORIDE 0.9 % IV BOLUS (SEPSIS)
700.0000 mL | Freq: Once | INTRAVENOUS | Status: AC
  Administered 2014-02-19: 700 mL via INTRAVENOUS

## 2014-02-19 NOTE — Discharge Instructions (Signed)
TAKE THE MECLIZINE FOR YOUR DIZZINESS!!! Use the zofran for nausea or vomiting. Recheck if you get worse or the chest or abdominal pain returns again. Try to drink a lot of fluids so you don't get dehydrated.    Benign Positional Vertigo Vertigo means you feel like you or your surroundings are moving when they are not. Benign positional vertigo is the most common form of vertigo. Benign means that the cause of your condition is not serious. Benign positional vertigo is more common in older adults. CAUSES  Benign positional vertigo is the result of an upset in the labyrinth system. This is an area in the middle ear that helps control your balance. This may be caused by a viral infection, head injury, or repetitive motion. However, often no specific cause is found. SYMPTOMS  Symptoms of benign positional vertigo occur when you move your head or eyes in different directions. Some of the symptoms may include:  Loss of balance and falls.  Vomiting.  Blurred vision.  Dizziness.  Nausea.  Involuntary eye movements (nystagmus). DIAGNOSIS  Benign positional vertigo is usually diagnosed by physical exam. If the specific cause of your benign positional vertigo is unknown, your caregiver may perform imaging tests, such as magnetic resonance imaging (MRI) or computed tomography (CT). TREATMENT  Your caregiver may recommend movements or procedures to correct the benign positional vertigo. Medicines such as meclizine, benzodiazepines, and medicines for nausea may be used to treat your symptoms. In rare cases, if your symptoms are caused by certain conditions that affect the inner ear, you may need surgery. HOME CARE INSTRUCTIONS   Follow your caregiver's instructions.  Move slowly. Do not make sudden body or head movements.  Avoid driving.  Avoid operating heavy machinery.  Avoid performing any tasks that would be dangerous to you or others during a vertigo episode.  Drink enough fluids to keep  your urine clear or pale yellow. SEEK IMMEDIATE MEDICAL CARE IF:   You develop problems with walking, weakness, numbness, or using your arms, hands, or legs.  You have difficulty speaking.  You develop severe headaches.  Your nausea or vomiting continues or gets worse.  You develop visual changes.  Your family or friends notice any behavioral changes.  Your condition gets worse.  You have a fever.  You develop a stiff neck or sensitivity to light. MAKE SURE YOU:   Understand these instructions.  Will watch your condition.  Will get help right away if you are not doing well or get worse. Document Released: 11/24/2005 Document Revised: 05/11/2011 Document Reviewed: 11/06/2010 [facility] Patient Information 2015 [place], [place]. This information is not intended to replace advice given to you by your health care provider. Make sure you discuss any questions you have with your health care provider.

## 2014-02-19 NOTE — ED Provider Notes (Signed)
CSN: [id number]     Arrival date & time 02/18/14  2315 History   First MD Initiated Contact with Patient 02/18/14 2350     Chief Complaint  Patient presents with  . Chest Pain  . Emesis  . Dizziness     (Consider location/radiation/quality/duration/timing/severity/associated sxs/prior Treatment) HPI   Patient reports she has been having vertigo symptoms for the past 6 weeks with dizziness and nausea. She reports she's been seen by 3 physicians in that time.. She reports however she has some old meclizine left over from an old prescription and she's only taken 3 meclizine tablets in the past 6 weeks. She states she got another prescription 2 days ago that she mailed off and is waiting to get back. She was seen by the ENT 2 days ago and due to her having clicking in her jaw he told her to eat soft foods for a while. She states tonight after eating spaghetti noodles with butter on them she started having vomiting about 6 PM. She reports severe chest pain like a band around her chest that lasted about 10 minutes. She also had the same type of pain in her upper abdomen that persisted for several hours and is improved now. She is reports multiple episodes of vomiting and states after the vomiting her pain would improve. She also has had several episodes of loose watery diarrhea without blood. She denies any fever. She's been having lightheadedness for the past week. She states if she lifts up her head she gets a spinning sensation. She states she gets heartburn if she eats tomato-based spaghetti sauce.  PCP Dr Regis Bill ENT Dr Lucia Gaskins  Past Medical History  Diagnosis Date  . Breast cancer     mastectomy 3   neg ln   . HTN (hypertension)   . Arthritis   . Wears hearing aid     both ears  . HOH (hard of hearing)   . Wears glasses    Past Surgical History  Procedure Laterality Date  . Appendectomy  2006  . Tonsillectomy and adenoidectomy      Childhood  . Colonoscopy    . Breast biopsy  2008      left mast  . Umbilical hernia repair N/A 09/05/2013    Procedure: HERNIA REPAIR UMBILICAL ADULT WITH MESH;  Surgeon: Joyice Faster. Cornett, MD;  Location: [facility];  Service: General;  Laterality: N/A;  . Insertion of mesh N/A 09/05/2013    Procedure: INSERTION OF MESH;  Surgeon: Joyice Faster. Cornett, MD;  Location: [facility];  Service: General;  Laterality: N/A;  . Hernia repair     Family History  Problem Relation Age of Onset  . Hypertension Mother   . Stroke Mother   . Heart failure Mother   . Cancer Sister     lung   . Colon cancer Father    History  Substance Use Topics  . Smoking status: Never Smoker   . Smokeless tobacco: Never Used  . Alcohol Use: Yes     Comment: occ   Lives at home Lives with spouse and daughter  OB History    Gravida Para Term Preterm AB TAB SAB Ectopic Multiple Living   0 0 0 0 0 0 0 0 0 0      Review of Systems  All other systems reviewed and are negative.     Allergies  Review of patient's allergies indicates no known allergies.  Home Medications   Prior to Admission  medications   Medication Sig Start Date End Date Taking? Authorizing Provider  acetaminophen (TYLENOL) 500 MG tablet Take 500 mg by mouth every 6 (six) hours as needed for moderate pain.   Yes Historical Provider, MD  aspirin 325 MG tablet Take 325 mg by mouth every 4 (four) hours as needed for moderate pain.   Yes Historical Provider, MD  atenolol (TENORMIN) 50 MG tablet TAKE 1 TABLET BY MOUTH EVERY DAY 10/12/13  Yes Burnis Medin, MD  CALCIUM-MAG-VIT C-VIT D PO Take 1 tablet by mouth 2 (two) times daily.    Yes Historical Provider, MD  Coenzyme Q10 (CO Q 10 PO) Take 300 mg by mouth 3 (three) times daily.   Yes Historical Provider, MD  Cyanocobalamin (VITAMIN B-12 CR PO) Take 1 tablet by mouth 2 (two) times daily.   Yes Historical Provider, MD  GLUCOSAMINE-CHONDROITIN PO Take 1 tablet by mouth 3 (three) times daily.    Yes Historical Provider, MD   ibuprofen (ADVIL,MOTRIN) 200 MG tablet Take 200 mg by mouth every 6 (six) hours as needed for moderate pain.   Yes Historical Provider, MD  lisinopril (PRINIVIL,ZESTRIL) 40 MG tablet Take 1 tablet (40 mg total) by mouth daily. 01/22/14  Yes Dorena Cookey, MD  LUTEIN PO Take 1 tablet by mouth 3 (three) times daily.    Yes Historical Provider, MD  vitamin C (ASCORBIC ACID) 500 MG tablet Take 500 mg by mouth 3 (three) times daily.   Yes Historical Provider, MD  HYDROcodone-acetaminophen (NORCO) 5-325 MG per tablet Take 1 tablet by mouth every 6 (six) hours as needed for moderate pain. Patient not taking: Reported on 02/18/2014 09/05/13   Erroll Luna, MD  meclizine (ANTIVERT) 25 MG tablet Take 1 tablet (25 mg total) by mouth every 6 (six) hours as needed for dizziness. 02/19/14   Janice Norrie, MD  ondansetron (ZOFRAN ODT) 4 MG disintegrating tablet Take 1 tablet (4 mg total) by mouth every 6 (six) hours as needed for nausea or vomiting. Patient not taking: Reported on 02/18/2014 09/08/13   Erroll Luna, MD  ondansetron (ZOFRAN) 4 MG tablet Take 1 tablet (4 mg total) by mouth every 8 (eight) hours as needed for nausea or vomiting. 02/19/14   Janice Norrie, MD   BP 148/56 mmHg  Pulse 58  Temp(Src) 97.6 F (36.4 C) (Oral)  Resp 22  Ht 5\' 5"  (1.651 m)  Wt 172 lb (78.019 kg)  BMI 28.62 kg/m2  SpO2 100%  Vital signs normal   Physical Exam  Constitutional: She is oriented to person, place, and time. She appears well-developed and well-nourished.  Non-toxic appearance. She does not appear ill. No distress.  HENT:  Head: Normocephalic and atraumatic.  Right Ear: External ear normal.  Left Ear: External ear normal.  Nose: Nose normal. No mucosal edema or rhinorrhea.  Mouth/Throat: Oropharynx is clear and moist and mucous membranes are normal. No dental abscesses or uvula swelling.  Dry tongue  Eyes: Conjunctivae and EOM are normal. Pupils are equal, round, and reactive to light.  No nystagmus   Neck: Normal range of motion and full passive range of motion without pain. Neck supple.  Cardiovascular: Normal rate, regular rhythm and normal heart sounds.  Exam reveals no gallop and no friction rub.   No murmur heard. Pulmonary/Chest: Effort normal and breath sounds normal. No respiratory distress. She has no wheezes. She has no rhonchi. She has no rales. She exhibits no tenderness and no crepitus.  Abdominal: Soft. Normal appearance  and bowel sounds are normal. She exhibits no distension. There is tenderness. There is no rebound and no guarding.    Mild diffuse upper abdominal pain  Musculoskeletal: Normal range of motion. She exhibits no edema or tenderness.  Moves all extremities well.   Neurological: She is alert and oriented to person, place, and time. She has normal strength. No cranial nerve deficit.  Skin: Skin is warm, dry and intact. No rash noted. No erythema. No pallor.  Psychiatric: She has a normal mood and affect. Her speech is normal and behavior is normal. Her mood appears not anxious.  Nursing note and vitals reviewed.   ED Course  Procedures (including critical care time)  Medications  ondansetron (ZOFRAN) injection 4 mg (4 mg Intravenous Given 02/19/14 0008)  sodium chloride 0.9 % bolus 800 mL (0 mLs Intravenous Stopped 02/19/14 0129)  meclizine (ANTIVERT) tablet 25 mg (25 mg Oral Given 02/19/14 0042)  sodium chloride 0.9 % bolus 700 mL (0 mLs Intravenous Stopped 02/19/14 0403)    Patient was rechecked after her bolus of fluid, IV nausea medicine, and Antivert. She states she and related to the bathroom with assistance she was feeling better. She denies any chest or abdominal pain at this time. She states her dizziness is improved. Patient still has a very dry tongue. She was given more fluids. We discussed getting a second troponin.  Patient was rechecked at 0400. She is doing well. She is finishing her second bolus of fluid. She is going to be discharged. Her  second troponin was negative.   Labs Review Results for orders placed or performed during the hospital encounter of 02/18/14  CBC with Differential  Result Value Ref Range   WBC 16.6 (H) 4.0 - 10.5 K/uL   RBC 4.72 3.87 - 5.11 MIL/uL   Hemoglobin 15.7 (H) 12.0 - 15.0 g/dL   HCT 45.7 36.0 - 46.0 %   MCV 96.8 78.0 - 100.0 fL   MCH 33.3 26.0 - 34.0 pg   MCHC 34.4 30.0 - 36.0 g/dL   RDW 12.5 11.5 - 15.5 %   Platelets 175 150 - 400 K/uL   Neutrophils Relative % 86 (H) 43 - 77 %   Neutro Abs 14.3 (H) 1.7 - 7.7 K/uL   Lymphocytes Relative 10 (L) 12 - 46 %   Lymphs Abs 1.6 0.7 - 4.0 K/uL   Monocytes Relative 4 3 - 12 %   Monocytes Absolute 0.6 0.1 - 1.0 K/uL   Eosinophils Relative 0 0 - 5 %   Eosinophils Absolute 0.1 0.0 - 0.7 K/uL   Basophils Relative 0 0 - 1 %   Basophils Absolute 0.0 0.0 - 0.1 K/uL  Comprehensive metabolic panel  Result Value Ref Range   Sodium 139 137 - 147 mEq/L   Potassium 4.4 3.7 - 5.3 mEq/L   Chloride 100 96 - 112 mEq/L   CO2 18 (L) 19 - 32 mEq/L   Glucose, Bld 201 (H) 70 - 99 mg/dL   BUN 35 (H) 6 - 23 mg/dL   Creatinine, Ser 1.01 0.50 - 1.10 mg/dL   Calcium 10.4 8.4 - 10.5 mg/dL   Total Protein 7.6 6.0 - 8.3 g/dL   Albumin 4.3 3.5 - 5.2 g/dL   AST 24 0 - 37 U/L   ALT 21 0 - 35 U/L   Alkaline Phosphatase 97 39 - 117 U/L   Total Bilirubin 0.6 0.3 - 1.2 mg/dL   GFR calc non Af Amer 49 (L) >90 mL/min  GFR calc Af Amer 56 (L) >90 mL/min   Anion gap 21 (H) 5 - 15  Lipase, blood  Result Value Ref Range   Lipase 54 11 - 59 U/L  Urinalysis, Routine w reflex microscopic  Result Value Ref Range   Color, Urine YELLOW YELLOW   APPearance CLEAR CLEAR   Specific Gravity, Urine 1.026 1.005 - 1.030   pH 6.0 5.0 - 8.0   Glucose, UA NEGATIVE NEGATIVE mg/dL   Hgb urine dipstick NEGATIVE NEGATIVE   Bilirubin Urine NEGATIVE NEGATIVE   Ketones, ur 40 (A) NEGATIVE mg/dL   Protein, ur NEGATIVE NEGATIVE mg/dL   Urobilinogen, UA 0.2 0.0 - 1.0 mg/dL   Nitrite  NEGATIVE NEGATIVE   Leukocytes, UA NEGATIVE NEGATIVE  I-stat troponin, ED (not at [facility])  Result Value Ref Range   Troponin i, poc 0.01 0.00 - 0.08 ng/mL   Comment 3          I-stat troponin, ED  Result Value Ref Range   Troponin i, poc 0.01 0.00 - 0.08 ng/mL   Comment 3           Laboratory interpretation all normal except leukocytosis c/w vomiting   Imaging Review Dg Chest Port 1 View  02/19/2014   CLINICAL DATA:  Chest pain, history of hypertension  EXAM: PORTABLE CHEST - 1 VIEW  COMPARISON:  08/29/2013 chest radiograph, CT chest 09/25/2013  FINDINGS: The heart size and mediastinal contours are within normal limits. Both lungs are clear. The visualized skeletal structures are unremarkable. Cardiac leads obscure detail.  IMPRESSION: No active disease.   Electronically Signed   By: Conchita Paris M.D.   On: 02/19/2014 00:20     EKG Interpretation   Date/Time:  Sunday February 18 2014 23:27:07 EST Ventricular Rate:  59 PR Interval:  161 QRS Duration: 87 QT Interval:  470 QTC Calculation: 466 R Axis:   -24 Text Interpretation:  Sinus rhythm Borderline left axis deviation Abnormal  R-wave progression, early transition No significant change since last  tracing 29 Aug 2013 Confirmed by Mairany Bruno  MD-I, Chelise Hanger ([id number]) on 02/19/2014  1:10:41 AM      MDM   Final diagnoses:  Vertigo  Nausea and vomiting, vomiting of unspecified type  Chest pain, unspecified chest pain type  Abdominal pain, unspecified abdominal location    Discharge Medication List as of 02/19/2014  4:06 AM    START taking these medications   Details  ondansetron (ZOFRAN) 4 MG tablet Take 1 tablet (4 mg total) by mouth every 8 (eight) hours as needed for nausea or vomiting., Starting 02/19/2014, Until Discontinued, Print        Plan discharge  Rolland Porter, MD, Alanson Aly, MD 02/19/14 [phone]

## 2014-02-19 NOTE — ED Notes (Signed)
Pt states started having n/v/d around 6 pm last night, states she didn't feel good yesterday though, states last time had n/v/d was 11 pm, pt laughing w/ family at this time, states abdominal pain comes and goes, denies chest pain at this time.

## 2014-02-19 NOTE — ED Notes (Signed)
Pt made aware we need urine sample, cannot go at this time.

## 2014-02-19 NOTE — ED Notes (Signed)
X-ray at bedside

## 2014-03-19 ENCOUNTER — Other Ambulatory Visit (INDEPENDENT_AMBULATORY_CARE_PROVIDER_SITE_OTHER): Payer: Self-pay | Admitting: *Deleted

## 2014-04-02 ENCOUNTER — Ambulatory Visit
Admission: RE | Admit: 2014-04-02 | Discharge: 2014-04-02 | Disposition: A | Discharge status: Home / Self Care | Payer: Commercial Managed Care - HMO | Source: Ambulatory Visit | Attending: Surgery | Admitting: Surgery

## 2014-04-02 DIAGNOSIS — Z853 Personal history of malignant neoplasm of breast: Secondary | ICD-10-CM | POA: Diagnosis not present

## 2014-04-02 DIAGNOSIS — R918 Other nonspecific abnormal finding of lung field: Secondary | ICD-10-CM | POA: Diagnosis not present

## 2014-04-02 DIAGNOSIS — K760 Fatty (change of) liver, not elsewhere classified: Secondary | ICD-10-CM | POA: Diagnosis not present

## 2014-04-02 MED ORDER — IOHEXOL 300 MG/ML  SOLN
75.0000 mL | Freq: Once | INTRAMUSCULAR | Status: AC | PRN
  Administered 2014-04-02: 75 mL via INTRAVENOUS

## 2014-05-14 ENCOUNTER — Telehealth: Payer: Self-pay

## 2014-05-14 NOTE — Telephone Encounter (Signed)
[facility] refill request for ATENOLOL 50MG  TAB

## 2014-05-15 MED ORDER — ATENOLOL 50 MG PO TABS: ORAL_TABLET | ORAL | Status: DC

## 2014-05-15 NOTE — Telephone Encounter (Signed)
Pt is past due for her medicare wellness exam.  Should have returned in 09/2013.  Please help her to make an appt.  Thanks! I have filled her medication for 90 days.

## 2014-05-17 NOTE — Telephone Encounter (Signed)
Pt has been sch

## 2014-06-06 ENCOUNTER — Other Ambulatory Visit: Payer: Self-pay | Admitting: Family Medicine

## 2014-06-06 NOTE — Telephone Encounter (Signed)
Sent to the pharmacy by e-scribe.  Pt has upcoming cpx on 10/31/14

## 2014-06-06 NOTE — Telephone Encounter (Signed)
Ok to refill until cpx in august

## 2014-06-25 ENCOUNTER — Telehealth: Payer: Self-pay | Admitting: Internal Medicine

## 2014-06-25 DIAGNOSIS — Z7689 Persons encountering health services in other specified circumstances: Secondary | ICD-10-CM

## 2014-06-25 NOTE — Telephone Encounter (Signed)
Referral placed in the system. 

## 2014-06-25 NOTE — Telephone Encounter (Signed)
Pt would like a referral to dr Barkley Bruns [phone] for toe nail cutting. Pt has [employer] medicare ins. Can we refer?

## 2014-07-13 ENCOUNTER — Other Ambulatory Visit: Payer: Self-pay | Admitting: Internal Medicine

## 2014-07-23 ENCOUNTER — Ambulatory Visit (INDEPENDENT_AMBULATORY_CARE_PROVIDER_SITE_OTHER): Payer: Commercial Managed Care - HMO | Admitting: Podiatry

## 2014-07-23 ENCOUNTER — Encounter: Payer: Self-pay | Admitting: Podiatry

## 2014-07-23 VITALS — BP 165/79 | HR 70 | Resp 12

## 2014-07-23 DIAGNOSIS — L609 Nail disorder, unspecified: Secondary | ICD-10-CM

## 2014-07-23 DIAGNOSIS — L608 Other nail disorders: Secondary | ICD-10-CM

## 2014-07-23 NOTE — Patient Instructions (Signed)
Okay to go to the pedicurist of your nails trimmed Do not place your feet in the whirlpool Do not manipulate cuticles

## 2014-07-23 NOTE — Progress Notes (Signed)
   Subjective:    Patient ID: Julie Boone, female    DOB: [birth year], 81 y.o.   MRN: [id number]  HPI  N-DISCOLORATION L-B/L TOENAILS D-1 YEAR O-SLOWLY C-SAME BUT LONGER A-NONE T-TRIED TO TRIM  Review of Systems  HENT: Positive for hearing loss.   Genitourinary: Positive for frequency.       Objective:   Physical Exam  Orientated 3  Vascular: DP and PT pulses 2/4 bilaterally Mild pitting edema ankles bilaterally Mild varicose veins ankles bilaterally  Neurological: Sensation to 10 g monofilament wire intact 4/5 right and 5/5 left Ankle reflex equal and reactive bilaterally Vibratory sensation nonreactive left and weakly reactive right  Dermatological: No skin lesions noted bilaterally The toenails are normal trophic with incurvation 6-10  Musculoskeletal: No deformities noted bilaterally No restriction ankle, subtalar, midtarsal joints bilaterally      Assessment & Plan:   Assessment: Satisfactory neurovascular status Incurvated toenails non-dystrophic 6-10  Plan: Review the results of examination with patient today Debridement toenails 6-10 as non-dystrophic  Advised patient to have nail debridement done at pedicurist with the instructions not to place feet and whirlpool or manipulate cuticles.  Reappoint at patient's request

## 2014-09-12 ENCOUNTER — Other Ambulatory Visit: Payer: Self-pay | Admitting: Internal Medicine

## 2014-09-12 NOTE — Telephone Encounter (Signed)
Sent to the pharmacy by e-scribe.  Pt has wellness scheduled for 10/31/14

## 2014-10-10 ENCOUNTER — Emergency Department (HOSPITAL_COMMUNITY)
Admission: EM | Admit: 2014-10-10 | Discharge: 2014-10-10 | Disposition: A | Discharge status: Home / Self Care | Payer: Commercial Managed Care - HMO | Attending: Emergency Medicine | Admitting: Emergency Medicine

## 2014-10-10 ENCOUNTER — Encounter (HOSPITAL_COMMUNITY): Payer: Self-pay | Admitting: Emergency Medicine

## 2014-10-10 ENCOUNTER — Encounter: Payer: Self-pay | Admitting: Adult Health

## 2014-10-10 ENCOUNTER — Emergency Department (HOSPITAL_COMMUNITY): Payer: Commercial Managed Care - HMO

## 2014-10-10 ENCOUNTER — Emergency Department (HOSPITAL_BASED_OUTPATIENT_CLINIC_OR_DEPARTMENT_OTHER)
Admit: 2014-10-10 | Discharge: 2014-10-10 | Disposition: A | Discharge status: Home / Self Care | Payer: Commercial Managed Care - HMO | Attending: Emergency Medicine | Admitting: Emergency Medicine

## 2014-10-10 ENCOUNTER — Ambulatory Visit (INDEPENDENT_AMBULATORY_CARE_PROVIDER_SITE_OTHER): Payer: Commercial Managed Care - HMO | Admitting: Adult Health

## 2014-10-10 VITALS — BP 134/80 | Temp 98.0°F

## 2014-10-10 DIAGNOSIS — Z7982 Long term (current) use of aspirin: Secondary | ICD-10-CM | POA: Diagnosis not present

## 2014-10-10 DIAGNOSIS — M25461 Effusion, right knee: Secondary | ICD-10-CM

## 2014-10-10 DIAGNOSIS — M1990 Unspecified osteoarthritis, unspecified site: Secondary | ICD-10-CM | POA: Insufficient documentation

## 2014-10-10 DIAGNOSIS — M79604 Pain in right leg: Secondary | ICD-10-CM

## 2014-10-10 DIAGNOSIS — I10 Essential (primary) hypertension: Secondary | ICD-10-CM | POA: Insufficient documentation

## 2014-10-10 DIAGNOSIS — R2241 Localized swelling, mass and lump, right lower limb: Secondary | ICD-10-CM | POA: Diagnosis present

## 2014-10-10 DIAGNOSIS — Z853 Personal history of malignant neoplasm of breast: Secondary | ICD-10-CM | POA: Diagnosis not present

## 2014-10-10 DIAGNOSIS — H9190 Unspecified hearing loss, unspecified ear: Secondary | ICD-10-CM | POA: Insufficient documentation

## 2014-10-10 DIAGNOSIS — Z79899 Other long term (current) drug therapy: Secondary | ICD-10-CM | POA: Insufficient documentation

## 2014-10-10 DIAGNOSIS — M79661 Pain in right lower leg: Secondary | ICD-10-CM | POA: Diagnosis not present

## 2014-10-10 DIAGNOSIS — M25551 Pain in right hip: Secondary | ICD-10-CM | POA: Diagnosis not present

## 2014-10-10 DIAGNOSIS — R609 Edema, unspecified: Secondary | ICD-10-CM

## 2014-10-10 DIAGNOSIS — M25561 Pain in right knee: Secondary | ICD-10-CM | POA: Diagnosis not present

## 2014-10-10 LAB — CBC
HCT: 41.7 % (ref 36.0–46.0)
Hemoglobin: 13.9 g/dL (ref 12.0–15.0)
MCH: 32.4 pg (ref 26.0–34.0)
MCHC: 33.3 g/dL (ref 30.0–36.0)
MCV: 97.2 fL (ref 78.0–100.0)
Platelets: 207 10*3/uL (ref 150–400)
RBC: 4.29 MIL/uL (ref 3.87–5.11)
RDW: 12.7 % (ref 11.5–15.5)
WBC: 9.6 10*3/uL (ref 4.0–10.5)

## 2014-10-10 LAB — BASIC METABOLIC PANEL
ANION GAP: 11 (ref 5–15)
BUN: 29 mg/dL — ABNORMAL HIGH (ref 6–20)
CHLORIDE: 106 mmol/L (ref 101–111)
CO2: 21 mmol/L — ABNORMAL LOW (ref 22–32)
CREATININE: 1.18 mg/dL — AB (ref 0.44–1.00)
Calcium: 9.6 mg/dL (ref 8.9–10.3)
GFR calc non Af Amer: 40 mL/min — ABNORMAL LOW (ref >60)
GFR, EST AFRICAN AMERICAN: 46 mL/min — AB (ref >60)
Glucose, Bld: 137 mg/dL — ABNORMAL HIGH (ref 65–99)
POTASSIUM: 4.6 mmol/L (ref 3.5–5.1)
Sodium: 138 mmol/L (ref 135–145)

## 2014-10-10 MED ORDER — MORPHINE SULFATE 4 MG/ML IJ SOLN
8.0000 mg | Freq: Once | INTRAMUSCULAR | Status: AC
  Administered 2014-10-10: 8 mg via INTRAMUSCULAR
  Filled 2014-10-10: qty 2

## 2014-10-10 MED ORDER — KETOROLAC TROMETHAMINE 60 MG/2ML IM SOLN
60.0000 mg | Freq: Once | INTRAMUSCULAR | Status: AC
  Administered 2014-10-10: 60 mg via INTRAMUSCULAR

## 2014-10-10 MED ORDER — TRAMADOL HCL 50 MG PO TABS: 50.0000 mg | ORAL_TABLET | Freq: Four times a day (QID) | ORAL | Status: DC | PRN

## 2014-10-10 MED ORDER — PROMETHAZINE HCL 25 MG/ML IJ SOLN
12.5000 mg | Freq: Once | INTRAMUSCULAR | Status: DC
  Filled 2014-10-10: qty 1

## 2014-10-10 MED ORDER — ONDANSETRON 8 MG PO TBDP
8.0000 mg | ORAL_TABLET | Freq: Once | ORAL | Status: AC
  Administered 2014-10-10: 8 mg via ORAL
  Filled 2014-10-10: qty 1

## 2014-10-10 NOTE — Progress Notes (Signed)
Pre visit review using our clinic review tool, if applicable. No additional management support is needed unless otherwise documented below in the visit note. 

## 2014-10-10 NOTE — Progress Notes (Signed)
*  PRELIMINARY RESULTS* Vascular Ultrasound Right lower extremity venous duplex has been completed.  Preliminary findings: negative for DVT  Landry Mellow, RDMS, RVT  10/10/2014, 5:58 PM

## 2014-10-10 NOTE — Progress Notes (Addendum)
Subjective:    Patient ID: Julie Boone, female    DOB: [birth year], 81 y.o.   MRN: [id number]  HPI  81 year old female who presents to the office today for two complaints.   1) Bilateral feet burning, this has been an ongoing issue for over a year. The pain subsided but has come back over the week. She describes the pain as "burning" and is mostly located in the heals and toes." Nothing she has tried ( feet soaks, cold) has helped with the pain.   2) Right knee pain.  - She first noticed this a week ago, but the pain wasn't as bad and she thought it would go away. This morning she woke up with swelling and "severe" pain in her knee. Most of the pain is located on the medial lower aspect but that the pain is "all over the front and back". She is unable to ambulate. The pain radiates to the right thigh.  During exam she got a severe cramp in her right leg which made her scream in pain and have to stand up.   Review of Systems  Constitutional: Positive for activity change.  Respiratory: Negative.   Cardiovascular: Negative.   Musculoskeletal: Positive for joint swelling and arthralgias.       Bilateral feet burning   Neurological: Negative.   All other systems reviewed and are negative.  Past Medical History  Diagnosis Date  . Breast cancer     mastectomy 3   neg ln   . HTN (hypertension)   . Arthritis   . Wears hearing aid     both ears  . HOH (hard of hearing)   . Wears glasses     Social History   Social History  . Marital Status: Married    Spouse Name: N/A  . Number of Children: N/A  . Years of Education: N/A   Occupational History  . Not on file.   Social History Main Topics  . Smoking status: Never Smoker   . Smokeless tobacco: Never Used  . Alcohol Use: Yes     Comment: occ  . Drug Use: No  . Sexual Activity: Not on file   Other Topics Concern  . Not on file   Social History Narrative   Usually receives 5 hours of sleep per night   3 people  living in the home   [place]   From [place] moved from [place]  Adopted daughter nearby.      Neg ets  Wine with meals  No tob rd.   G0P0   Married husband [place] background  [place] descent.             Past Surgical History  Procedure Laterality Date  . Appendectomy  2006  . Tonsillectomy and adenoidectomy      Childhood  . Colonoscopy    . Breast biopsy  2008    left mast  . Umbilical hernia repair N/A 09/05/2013    Procedure: HERNIA REPAIR UMBILICAL ADULT WITH MESH;  Surgeon: Joyice Faster. Cornett, MD;  Location: [place];  Service: General;  Laterality: N/A;  . Insertion of mesh N/A 09/05/2013    Procedure: INSERTION OF MESH;  Surgeon: Joyice Faster. Cornett, MD;  Location: [place];  Service: General;  Laterality: N/A;  . Hernia repair      Family History  Problem Relation Age of Onset  . Hypertension Mother   . Stroke  Mother   . Heart failure Mother   . Cancer Sister     lung   . Colon cancer Father     No Known Allergies  Current Outpatient Prescriptions on File Prior to Visit  Medication Sig Dispense Refill  . acetaminophen (TYLENOL) 500 MG tablet Take 500 mg by mouth every 6 (six) hours as needed for moderate pain.    [name] aspirin 325 MG tablet Take 325 mg by mouth every 4 (four) hours as needed for moderate pain.    [name] atenolol (TENORMIN) 50 MG tablet TAKE 1 TABLET EVERY DAY 90 tablet 0  . CALCIUM-MAG-VIT C-VIT D PO Take 1 tablet by mouth 2 (two) times daily.     . Coenzyme Q10 (CO Q 10 PO) Take 300 mg by mouth 3 (three) times daily.    . Cyanocobalamin (VITAMIN B-12 CR PO) Take 1 tablet by mouth 2 (two) times daily.    [name] GLUCOSAMINE-CHONDROITIN PO Take 1 tablet by mouth 3 (three) times daily.     [name] ibuprofen (ADVIL,MOTRIN) 200 MG tablet Take 200 mg by mouth every 6 (six) hours as needed for moderate pain.    [name] lisinopril (PRINIVIL,ZESTRIL) 40 MG tablet TAKE 1 TABLET EVERY DAY 90 tablet 1  . LUTEIN PO Take 1  tablet by mouth 3 (three) times daily.     . meclizine (ANTIVERT) 25 MG tablet Take 1 tablet (25 mg total) by mouth every 6 (six) hours as needed for dizziness. 40 tablet 0  . ondansetron (ZOFRAN ODT) 4 MG disintegrating tablet Take 1 tablet (4 mg total) by mouth every 6 (six) hours as needed for nausea or vomiting. 15 tablet 0  . ondansetron (ZOFRAN) 4 MG tablet Take 1 tablet (4 mg total) by mouth every 8 (eight) hours as needed for nausea or vomiting. 12 tablet 0  . vitamin C (ASCORBIC ACID) 500 MG tablet Take 500 mg by mouth 3 (three) times daily.    [name] HYDROcodone-acetaminophen (NORCO) 5-325 MG per tablet Take 1 tablet by mouth every 6 (six) hours as needed for moderate pain. (Patient not taking: Reported on 10/10/2014) 30 tablet 0   No current facility-administered medications on file prior to visit.    BP 134/80 mmHg  Temp(Src) 98 F (36.7 C) (Oral)       Objective:   Physical Exam  Constitutional: She is oriented to person, place, and time. She appears well-developed and well-nourished. No distress.  Musculoskeletal: She exhibits edema and tenderness.  Swelling to right knee that is painful to palpation on all aspects of knee. Greater swelling in right lower extremity than left lower extremity.  No warmth or redness in right calf or right knee.  No bakers cyst felt  Neurological: She is alert and oriented to person, place, and time.  Skin: Skin is warm and dry. No rash noted. She is not diaphoretic. No erythema. No pallor.  Nursing note and vitals reviewed.      Assessment & Plan:  1. Knee swelling, right - Unable to complete exam due to pain - Patient given 60mg  IM Toradol and sent to the ER for further evaluation ( films, labs, pain control).  -Septic knee vs DVT  vs bursitis. - she did not want to go via EMS, her family will take her to ER

## 2014-10-10 NOTE — ED Notes (Signed)
Pt states went to the PCP today for feet pain. While walking at doctors office began having excruciating pain in right thigh/knee/upper shin. States her PCP gave her a shot of "Tramadol (possibly toradol)" in the office and sent her here because they weren't sure where the pain was coming from. States she has had increasing pain behind her right knee over the last couple days.

## 2014-10-10 NOTE — ED Provider Notes (Signed)
CSN: [id number]     Arrival date & time 10/10/14  1432 History   First MD Initiated Contact with Patient 10/10/14 1629     Chief Complaint  Patient presents with  . Knee Pain  . Leg Swelling     (Consider location/radiation/quality/duration/timing/severity/associated sxs/prior Treatment) Patient is a 81 y.o. female presenting with leg pain.  Leg Pain Location:  Leg Injury: no   Leg location:  R leg Pain details:    Quality:  Aching and sharp   Radiates to:  Does not radiate   Severity:  Mild   Onset quality:  Gradual   Duration:  1 day   Timing:  Constant   Progression:  Worsening Chronicity:  New Dislocation: no   Foreign body present:  No foreign bodies Prior injury to area:  No Associated symptoms: no back pain and no fever     81 year old female with worsening right leg pain behind her knee starting today. Also associated right leg swelling and some calf pain. No redness before. No fevers, recent infections, trauma or travels. Patient is normally very active and this is very disabling for her.  Past Medical History  Diagnosis Date  . Breast cancer     mastectomy 3   neg ln   . HTN (hypertension)   . Arthritis   . Wears hearing aid     both ears  . HOH (hard of hearing)   . Wears glasses    Past Surgical History  Procedure Laterality Date  . Appendectomy  2006  . Tonsillectomy and adenoidectomy      Childhood  . Colonoscopy    . Breast biopsy  2008    left mast  . Umbilical hernia repair N/A 09/05/2013    Procedure: HERNIA REPAIR UMBILICAL ADULT WITH MESH;  Surgeon: Joyice Faster. Cornett, MD;  Location: [facility];  Service: General;  Laterality: N/A;  . Insertion of mesh N/A 09/05/2013    Procedure: INSERTION OF MESH;  Surgeon: Joyice Faster. Cornett, MD;  Location: [facility];  Service: General;  Laterality: N/A;  . Hernia repair     Family History  Problem Relation Age of Onset  . Hypertension Mother   . Stroke Mother   . Heart  failure Mother   . Cancer Sister     lung   . Colon cancer Father    Social History  Substance Use Topics  . Smoking status: Never Smoker   . Smokeless tobacco: Never Used  . Alcohol Use: Yes     Comment: occ   OB History    Gravida Para Term Preterm AB TAB SAB Ectopic Multiple Living   0 0 0 0 0 0 0 0 0 0      Review of Systems  Constitutional: Negative for fever and chills.  HENT: Negative for congestion and ear pain.   Eyes: Negative for pain.  Respiratory: Negative for cough and stridor.   Cardiovascular: Positive for leg swelling. Negative for chest pain and palpitations.  Gastrointestinal: Negative for abdominal pain.  Endocrine: Negative for polydipsia and polyuria.  Genitourinary: Negative for dysuria.  Musculoskeletal: Positive for joint swelling and gait problem. Negative for back pain.  Skin: Negative for pallor and wound.  Neurological: Negative for dizziness and headaches.  All other systems reviewed and are negative.     Allergies  Review of patient's allergies indicates no known allergies.  Home Medications   Prior to Admission medications   Medication Sig Start Date End Date Taking?  Authorizing Provider  acetaminophen (TYLENOL) 500 MG tablet Take 500 mg by mouth every 6 (six) hours as needed for moderate pain.   Yes Historical Provider, MD  aspirin 325 MG tablet Take 325-650 mg by mouth every 4 (four) hours as needed for moderate pain.    Yes Historical Provider, MD  atenolol (TENORMIN) 50 MG tablet TAKE 1 TABLET EVERY DAY 09/12/14  Yes Burnis Medin, MD  CALCIUM-MAG-VIT C-VIT D PO Take 1 tablet by mouth daily.    Yes Historical Provider, MD  Coenzyme Q10 (CO Q 10 PO) Take 300 mg by mouth daily.    Yes Historical Provider, MD  Cyanocobalamin (VITAMIN B-12 CR PO) Take 1 tablet by mouth 2 (two) times daily.   Yes Historical Provider, MD  ibuprofen (ADVIL,MOTRIN) 200 MG tablet Take 200 mg by mouth every 6 (six) hours as needed for moderate pain.   Yes  Historical Provider, MD  Ketorolac Tromethamine (TORADOL IJ) Inject 60 mg as directed once.   Yes Historical Provider, MD  lisinopril (PRINIVIL,ZESTRIL) 40 MG tablet TAKE 1 TABLET EVERY DAY 06/06/14  Yes Burnis Medin, MD  LUTEIN PO Take 1 tablet by mouth 3 (three) times a week.    Yes Historical Provider, MD  meclizine (ANTIVERT) 25 MG tablet Take 1 tablet (25 mg total) by mouth every 6 (six) hours as needed for dizziness. 02/19/14  Yes Rolland Porter, MD  naproxen sodium (ANAPROX) 220 MG tablet Take 220-440 mg by mouth 2 (two) times daily as needed (pain).   Yes Historical Provider, MD  ondansetron (ZOFRAN) 4 MG tablet Take 1 tablet (4 mg total) by mouth every 8 (eight) hours as needed for nausea or vomiting. 02/19/14  Yes Rolland Porter, MD  OVER THE COUNTER MEDICATION Apply 1 application topically daily as needed. Itching.  (Calamine Lotion) 08/31/14  Yes Historical Provider, MD  vitamin C (ASCORBIC ACID) 500 MG tablet Take 500 mg by mouth daily.    Yes Historical Provider, MD  traMADol (ULTRAM) 50 MG tablet Take 1 tablet (50 mg total) by mouth every 6 (six) hours as needed. 10/10/14   Junetta Hearn, MD   BP 150/64 mmHg  Pulse 55  Temp(Src) 97.6 F (36.4 C) (Oral)  Resp 22  SpO2 100% Physical Exam  Constitutional: She is oriented to person, place, and time. She appears well-developed and well-nourished.  HENT:  Head: Normocephalic and atraumatic.  Eyes: Conjunctivae and EOM are normal. Right eye exhibits no discharge. Left eye exhibits no discharge.  Cardiovascular: Normal rate and regular rhythm.   Pulmonary/Chest: Effort normal and breath sounds normal. No respiratory distress.  Abdominal: Soft. She exhibits no distension. There is no tenderness. There is no rebound.  Musculoskeletal: Normal range of motion. She exhibits edema (RLE) and tenderness (posterior knee and calf).  2+ pulses bilateral lower extremities  Neurological: She is alert and oriented to person, place, and time.  Skin: Skin is  warm and dry.  Venous stasis ankles and below bilaterally  Nursing note and vitals reviewed.   ED Course  Procedures (including critical care time) Labs Review Labs Reviewed  BASIC METABOLIC PANEL - Abnormal; Notable for the following:    CO2 21 (*)    Glucose, Bld 137 (*)    BUN 29 (*)    Creatinine, Ser 1.18 (*)    GFR calc non Af Amer 40 (*)    GFR calc Af Amer 46 (*)    All other components within normal limits  CBC    Imaging  Review Dg Knee 2 Views Right  10/10/2014   CLINICAL DATA:  Severe right knee pain without trauma. Initial encounter.  EXAM: RIGHT KNEE - 1-2 VIEW  COMPARISON:  None.  FINDINGS: Mild medial and patellofemoral compartment joint space narrowing. No acute fracture or dislocation. No joint effusion. Vascular calcifications.  IMPRESSION: Degenerative change, without acute osseous finding.   Electronically Signed   By: Abigail Miyamoto M.D.   On: 10/10/2014 17:15   Dg Hip Unilat With Pelvis 2-3 Views Right  10/10/2014   CLINICAL DATA:  Severe right hip pain.  No injury.  EXAM: DG HIP (WITH OR WITHOUT PELVIS) 2-3V RIGHT  COMPARISON:  None.  FINDINGS: Osteopenia. Sacroiliac joints are symmetric. Femoral heads are located. No acute fracture. Joint spaces are maintained for age.  IMPRESSION: No acute osseous abnormality.   Electronically Signed   By: Abigail Miyamoto M.D.   On: 10/10/2014 17:13     EKG Interpretation None      MDM   Final diagnoses:  Pain of right lower extremity   Possible DVT v bakers cyst but has minimal edema so dvt more likely, will check [place] and xr's and offer analgesia. Doubt septic joint or gout. Normal pulses and no h/o afib, doubt arterial problem.   No DVT or obvious fracture. Nauseated now 2/2 pain meds, will allow it to get better before dc. Also with HR in 50's, asymptomatic, on atenolol will d/w PCP about possibly changing dose.   I have personally and contemperaneously reviewed labs and imaging and used in my decision making as above.    A medical screening exam was performed and I feel the patient has had an appropriate workup for their chief complaint at this time and likelihood of emergent condition existing is low. They have been counseled on decision, discharge, follow up and which symptoms necessitate immediate return to the emergency department. They or their family verbally stated understanding and agreement with plan and discharged in stable condition.      Merrily Pew, MD 10/11/14 [phone]

## 2014-10-10 NOTE — Addendum Note (Signed)
Addended by: Colleen Can on: 10/10/2014 02:41 PM   Modules accepted: Orders

## 2014-10-12 ENCOUNTER — Telehealth: Payer: Self-pay | Admitting: Adult Health

## 2014-10-12 DIAGNOSIS — M25561 Pain in right knee: Secondary | ICD-10-CM

## 2014-10-12 MED ORDER — ONDANSETRON HCL 4 MG PO TABS: 4.0000 mg | ORAL_TABLET | Freq: Three times a day (TID) | ORAL | Status: DC | PRN

## 2014-10-12 NOTE — Telephone Encounter (Signed)
On Wednesday [facility] sent Julie Boone to the hospital (MRN: [id number]).  They gave her Morphine which made her very sick.  They gave her a prescription for Tramadol, which is making her sick too.  So the hospital nurse said to call out here and see if she could get something called in for nausea.  Her daughter is here sitting in the lobby. **Daughter said you could just call her to take the pressure off you of her just sitting here waiting for you.  Pharmacy- [facility]. ([place])

## 2014-10-12 NOTE — Addendum Note (Signed)
Addended by: Colleen Can on: 10/12/2014 04:06 PM   Modules accepted: Orders

## 2014-10-12 NOTE — Telephone Encounter (Signed)
Ok per [place] to send in Zofran 4 mg tablets- Take every 8 hours as needed for nausea #20.  Pt's daughter is aware.  Per pt's daughter pt got out of bed this morning and walked a short way to the living room and broke out in a sweat, had slurred speech and was sweating.  Pt's daughter stayed home with her mother to continue to monitor her.  Advised pt's daughter to seek medical attention immediately if her mother has symptoms of slurred speech again. Pt's daughter verbalized understanding. Per pt's daughter she ate breakfast and seemed better, speech cleared up and pt went back to bed for about 3 hours.  Pt's daughter feels the combination of leg pain, nausea, and the medication caused the slurred speech.  She would also like to know if pt can have a referral for [facility].  Advised that they have an after hours clinic and an urgent care if needed, also advised of 24 hour on call nursing system.

## 2014-10-17 DIAGNOSIS — M1711 Unilateral primary osteoarthritis, right knee: Secondary | ICD-10-CM | POA: Diagnosis not present

## 2014-10-31 ENCOUNTER — Encounter: Payer: Self-pay | Admitting: Internal Medicine

## 2014-10-31 ENCOUNTER — Ambulatory Visit (INDEPENDENT_AMBULATORY_CARE_PROVIDER_SITE_OTHER): Payer: Commercial Managed Care - HMO | Admitting: Internal Medicine

## 2014-10-31 VITALS — BP 166/74 | Temp 98.2°F | Ht 63.0 in | Wt 170.7 lb

## 2014-10-31 DIAGNOSIS — Z79899 Other long term (current) drug therapy: Secondary | ICD-10-CM

## 2014-10-31 DIAGNOSIS — M1711 Unilateral primary osteoarthritis, right knee: Secondary | ICD-10-CM

## 2014-10-31 DIAGNOSIS — M129 Arthropathy, unspecified: Secondary | ICD-10-CM

## 2014-10-31 DIAGNOSIS — Z0000 Encounter for general adult medical examination without abnormal findings: Secondary | ICD-10-CM

## 2014-10-31 DIAGNOSIS — Z2821 Immunization not carried out because of patient refusal: Secondary | ICD-10-CM | POA: Diagnosis not present

## 2014-10-31 DIAGNOSIS — M25561 Pain in right knee: Secondary | ICD-10-CM | POA: Diagnosis not present

## 2014-10-31 DIAGNOSIS — H9193 Unspecified hearing loss, bilateral: Secondary | ICD-10-CM

## 2014-10-31 DIAGNOSIS — I10 Essential (primary) hypertension: Secondary | ICD-10-CM | POA: Diagnosis not present

## 2014-10-31 DIAGNOSIS — Z853 Personal history of malignant neoplasm of breast: Secondary | ICD-10-CM | POA: Diagnosis not present

## 2014-10-31 LAB — LIPID PANEL
CHOL/HDL RATIO: 6
Cholesterol: 204 mg/dL — ABNORMAL HIGH (ref 0–200)
HDL: 32.6 mg/dL — AB (ref >39.00)
NonHDL: 171
Triglycerides: 269 mg/dL — ABNORMAL HIGH (ref 0.0–149.0)
VLDL: 53.8 mg/dL — ABNORMAL HIGH (ref 0.0–40.0)

## 2014-10-31 LAB — HEPATIC FUNCTION PANEL
ALBUMIN: 4.1 g/dL (ref 3.5–5.2)
ALT: 26 U/L (ref 0–35)
AST: 23 U/L (ref 0–37)
Alkaline Phosphatase: 74 U/L (ref 39–117)
BILIRUBIN TOTAL: 0.6 mg/dL (ref 0.2–1.2)
Bilirubin, Direct: 0.1 mg/dL (ref 0.0–0.3)
Total Protein: 7 g/dL (ref 6.0–8.3)

## 2014-10-31 LAB — CBC WITH DIFFERENTIAL/PLATELET
Basophils Absolute: 0.1 10*3/uL (ref 0.0–0.1)
Basophils Relative: 0.5 % (ref 0.0–3.0)
EOS ABS: 0.3 10*3/uL (ref 0.0–0.7)
Eosinophils Relative: 2.5 % (ref 0.0–5.0)
HCT: 43.4 % (ref 36.0–46.0)
Hemoglobin: 14.7 g/dL (ref 12.0–15.0)
LYMPHS ABS: 2.6 10*3/uL (ref 0.7–4.0)
Lymphocytes Relative: 22.7 % (ref 12.0–46.0)
MCHC: 33.8 g/dL (ref 30.0–36.0)
MCV: 96.5 fl (ref 78.0–100.0)
MONO ABS: 0.7 10*3/uL (ref 0.1–1.0)
MONOS PCT: 6.1 % (ref 3.0–12.0)
NEUTROS ABS: 7.8 10*3/uL — AB (ref 1.4–7.7)
Neutrophils Relative %: 68.2 % (ref 43.0–77.0)
PLATELETS: 215 10*3/uL (ref 150.0–400.0)
RBC: 4.5 Mil/uL (ref 3.87–5.11)
RDW: 13.3 % (ref 11.5–15.5)
WBC: 11.4 10*3/uL — ABNORMAL HIGH (ref 4.0–10.5)

## 2014-10-31 LAB — BASIC METABOLIC PANEL
BUN: 25 mg/dL — AB (ref 6–23)
CALCIUM: 9.6 mg/dL (ref 8.4–10.5)
CO2: 26 meq/L (ref 19–32)
CREATININE: 0.92 mg/dL (ref 0.40–1.20)
Chloride: 106 mEq/L (ref 96–112)
GFR: 61.15 mL/min (ref >60.00)
GLUCOSE: 117 mg/dL — AB (ref 70–99)
Potassium: 4.6 mEq/L (ref 3.5–5.1)
Sodium: 140 mEq/L (ref 135–145)

## 2014-10-31 LAB — TSH: TSH: 1.41 u[IU]/mL (ref 0.35–4.50)

## 2014-10-31 LAB — LDL CHOLESTEROL, DIRECT: Direct LDL: 110 mg/dL

## 2014-10-31 MED ORDER — DICLOFENAC SODIUM 1 % TD GEL: 4.0000 g | Freq: Four times a day (QID) | TRANSDERMAL | Status: DC | PRN

## 2014-10-31 NOTE — Patient Instructions (Addendum)
Will refer for second opinion   about the knee as discussed .  Since the shot didn't help. I  Do not think that age   Should be a contraindication to surgery  Alone since you are well otherwise .  Get your hearing aids fixed or  Evaluated .   [facility] has a good team   Can try topical voltaren  Gel.   In the  Interim  4 x per day  To see if helps pain without  Stomach side effects .   Yearly flu  Vaccine still advised  iif you change your mind . Due for prevnar 13 also if you agree.  Will notify you  of labs when available.

## 2014-10-31 NOTE — Progress Notes (Signed)
Pre visit review using our clinic review tool, if applicable. No additional management support is needed unless otherwise documented below in the visit note.  Chief Complaint  Patient presents with  . Medicare Wellness    knee pain still a problem  hearing aids dont work    HPI: Julie Boone 81 y.o. comes in today for Preventive Medicare wellness visit . And Chronic disease management  Here with husband  Had acute swelling and sever pain right knee see last notes with gnp.  Saw dr Alfonso Ramus who said too old for surgery and would try cs injection  Given and not that helpful?  Given some med tramadol but not taking   Taking  tylenol and some advil?  Daughter got her a knee support brace and helps some  Not that tender at rest but very painful when walking   Hearing aids  not working well  Needs to see audiologist   BP is good  But up today from pain  Gets mammogram  Had pna vaccine when younger  and doesn't want any today   Health Maintenance  Topic Date Due  . TETANUS/TDAP  03/25/1943  . ZOSTAVAX  03/25/1986  . PNA vac Low Risk Adult (1 of 2 - PCV13) 03/26/1991  . INFLUENZA VACCINE  10/31/2015 (Originally 10/01/2014)  . MAMMOGRAM  02/10/2015  . COLONOSCOPY  08/14/2017  . DEXA SCAN  Completed   Health Maintenance Review LIFESTYLE:  Exercise:  Pre knee pain  Walking  Tobacco/ETS:no Alcohol: per day 1 with dinner Sugar beverages: ocass gatorade  Sleep: ok  Pre painup for urination   MEDICARE DOCUMENT QUESTIONS  TO SCAN   Hearing: hard of hearing aids not working well   Vision:  No limitations at present . Last eye check UTD  Safety:  Has smoke detector and wears seat belts.  No firearms. No excess sun exposure. Sees dentist regularly.  Falls: no  Advance directive :  Reviewed  Has one.  Memory: Felt to be good  , no concern from her or her family.  Depression: No anhedonia unusual crying or depressive symptoms  Nutrition: Eats well balanced diet; adequate  calcium and vitamin D. No swallowing chewing problems.  Injury: no major injuries in the last six months.  Other healthcare providers:  Reviewed today .  Social:  Lives with spouse married. No pets.   Preventive parameters: up-to-date  Reviewed   ADLS:   There are no problems or need for assistance  driving, feeding, obtaining food, dressing, toileting and bathing, managing money using phone. She is independent.  Until knee a problem and now limping and using cane     ROS:  See above  GEN/ HEENT: No fever, significant weight changes sweats headaches vision problems hearing changes, CV/ PULM; No chest pain shortness of breath cough, syncope,edema  change in exercise tolerance. GI /GU: No adominal pain, vomiting, change in bowel habits. No blood in the stool. No significant GU symptoms. SKIN/HEME: ,no acute skin rashes suspicious lesions or bleeding. No lymphadenopathy, nodules, masses.  NEURO/ PSYCH:  No neurologic signs such as weakness numbness. No depression anxiety. IMM/ Allergy: No unusual infections.  Allergy .   REST of 12 system review negative except as per HPI   Past Medical History  Diagnosis Date  . Breast cancer     mastectomy 3   neg ln   . HTN (hypertension)   . Arthritis   . Wears hearing aid     both ears  .  HOH (hard of hearing)   . Wears glasses     Family History  Problem Relation Age of Onset  . Hypertension Mother   . Stroke Mother   . Heart failure Mother   . Cancer Sister     lung   . Colon cancer Father     Social History   Social History  . Marital Status: Married    Spouse Name: N/A  . Number of Children: N/A  . Years of Education: N/A   Social History Main Topics  . Smoking status: Never Smoker   . Smokeless tobacco: Never Used  . Alcohol Use: Yes     Comment: occ  . Drug Use: No  . Sexual Activity: Not Asked   Other Topics Concern  . None   Social History Narrative   Usually receives 5 hours of sleep per night   3 people  living in the home   Puppy  smakk [place] and [place]   From [place] moved from [place]  Adopted daughter nearby.      Neg ets  Wine with meals  No tob rd.   G0P0   Married husband [place] background  Herself [place] descent.             Outpatient Encounter Prescriptions as of 10/31/2014  Medication Sig  . acetaminophen (TYLENOL) 500 MG tablet Take 500 mg by mouth every 6 (six) hours as needed for moderate pain.  [name] aspirin 325 MG tablet Take 325-650 mg by mouth every 4 (four) hours as needed for moderate pain.   [name] atenolol (TENORMIN) 50 MG tablet TAKE 1 TABLET EVERY DAY  . CALCIUM-MAG-VIT C-VIT D PO Take 1 tablet by mouth daily.   . Coenzyme Q10 (CO Q 10 PO) Take 300 mg by mouth daily.   . Cyanocobalamin (VITAMIN B-12 CR PO) Take 1 tablet by mouth 2 (two) times daily.  [name] lisinopril (PRINIVIL,ZESTRIL) 40 MG tablet TAKE 1 TABLET EVERY DAY  . meclizine (ANTIVERT) 25 MG tablet Take 1 tablet (25 mg total) by mouth every 6 (six) hours as needed for dizziness.  . ondansetron (ZOFRAN) 4 MG tablet Take 1 tablet (4 mg total) by mouth every 8 (eight) hours as needed for nausea or vomiting.  . ondansetron (ZOFRAN) 4 MG tablet Take 1 tablet (4 mg total) by mouth every 8 (eight) hours as needed for nausea or vomiting.  [name] OVER THE COUNTER MEDICATION Apply 1 application topically daily as needed. Itching.  (Calamine Lotion)  . traMADol (ULTRAM) 50 MG tablet Take 1 tablet (50 mg total) by mouth every 6 (six) hours as needed.  . vitamin C (ASCORBIC ACID) 500 MG tablet Take 500 mg by mouth daily.   . diclofenac sodium (VOLTAREN) 1 % GEL Apply 4 g topically 4 (four) times daily as needed.  [name] ibuprofen (ADVIL,MOTRIN) 200 MG tablet Take 200 mg by mouth every 6 (six) hours as needed for moderate pain.  . naproxen sodium (ANAPROX) 220 MG tablet Take 220-440 mg by mouth 2 (two) times daily as needed (pain).  . [DISCONTINUED] Ketorolac Tromethamine (TORADOL IJ) Inject 60 mg as directed once.  . [DISCONTINUED] LUTEIN  PO Take 1 tablet by mouth 3 (three) times a week.    No facility-administered encounter medications on file as of 10/31/2014.    EXAM:  BP 166/74 mmHg  Temp(Src) 98.2 F (36.8 C) (Oral)  Ht 5\' 3"  (1.6 m)  Wt 170 lb 11.2 oz (77.429 kg)  BMI 30.25 kg/m2  Body mass index  is 30.25 kg/(m^2).  Physical Exam: Vital signs reviewed SJG:GEZM is a well-developed well-nourished alert cooperative   who appears stated age in no acute distress.  Hard of hearing   Right knee in brace  HEENT: normocephalic atraumatic , Eyes: PERRL EOM's full, conjunctiva clear, Nares: paten,t no deformity discharge or tenderness., Ears: no deformity EAC's clear TMs with normal landmarks. Mouth: clear OP, no lesions, edema.  Moist mucous membranes. Dentition in adequate repair. NECK: supple without masses, thyromegaly or bruits. CHEST/PULM:  Clear to auscultation and percussion breath sounds equal no wheeze , rales or rhonchi. No chest wall deformities or tenderness. CV: PMI is nondisplaced, S1 S2 no gallops, murmurs, rubs. Peripheral pulses are full without delay.No JVD .  brast right no nodule  Left missing  ABDOMEN: Bowel sounds normal nontender  No guard or rebound, no hepato splenomegal no CVA tenderness.  No hernia. Extremtities:  No clubbing cyanosis or edema, r knee tender  Medial  No edema warmth or redness NEURO:  Oriented x3, cranial nerves 3-12 appear to be intactx hearing , no obvious focal weakness,gait within normal limits no abnormal reflexes or asymmetrical SKIN: No acute rashes normal turgor, color, no bruising or petechiae. Age related changes  PSYCH: Oriented, good eye contact, no obvious depression anxiety, cognition and judgment appear normal. LN: no cervical axillary inguinal adenopathy No noted deficits in memory, attention, and speech.  BP Readings from Last 3 Encounters:  10/31/14 166/74  10/10/14 143/57  10/10/14 134/80   Wt Readings from Last 3 Encounters:  10/31/14 170 lb 11.2 oz (77.429  kg)  02/18/14 172 lb (78.019 kg)  02/15/14 172 lb 14.4 oz (78.427 kg)    ASSESSMENT AND PLAN:  Discussed the following assessment and plan:  Visit for preventive health examination - Plan: Basic metabolic panel, CBC with Differential/Platelet, Hepatic function panel, Lipid panel, TSH  Medicare annual wellness visit, subsequent  Medication management - Plan: Basic metabolic panel, CBC with Differential/Platelet, Hepatic function panel, Lipid panel, TSH  Arthritis of knee, right - as reported   cs injecxtion ont helpful  ok for second opinion topical nsaid and brace ok  will refer - Plan: Ambulatory referral to Orthopedic Surgery  Essential hypertension - up today had been controlled  / efect of nsaids  follow at home to ensure at goal  - Plan: Basic metabolic panel, CBC with Differential/Platelet, Hepatic function panel, Lipid panel, TSH  Decreased hearing, bilateral - get hearing aids checkked disc  - Plan: Basic metabolic panel, CBC with Differential/Platelet, Hepatic function panel, Lipid panel, TSH  Right knee pain - Plan: Basic metabolic panel, CBC with Differential/Platelet, Hepatic function panel, Lipid panel, TSH, Ambulatory referral to Orthopedic Surgery  HX: breast cancer - Plan: Basic metabolic panel, CBC with Differential/Platelet, Hepatic function panel, Lipid panel, TSH  Influenza vaccination declined Patient very distressed about her knee problem she was active and walked exercise before this issue and  Would like second opinon  And i agree at this time   Initial steroid inj not helpful although seems like she can walk now.   Try topical nsaid  If covered by  Insurance  Less  Risky se etc .  Labs today  Patient Care Team: Burnis Medin, MD as PCP - General (Internal Medicine) Erroll Luna, MD as Consulting Physician (General Surgery) Inocencio Homes, DPM as Consulting Physician (Podiatry)  Patient Instructions  Will refer for second opinion   about the knee as discussed  .  Since the shot didn't help. I  Do not think that age   Should be a contraindication to surgery  Alone since you are well otherwise .  Get your hearing aids fixed or  Evaluated .   [facility] has a good team   Can try topical voltaren  Gel.   In the  Interim  4 x per day  To see if helps pain without  Stomach side effects .   Yearly flu  Vaccine still advised  iif you change your mind . Due for prevnar 13 also if you agree.  Will notify you  of labs when available.     Standley Brooking. Panosh M.D.  Lab Results  Component Value Date   WBC 11.4* 10/31/2014   HGB 14.7 10/31/2014   HCT 43.4 10/31/2014   PLT 215.0 10/31/2014   GLUCOSE 117* 10/31/2014   CHOL 204* 10/31/2014   TRIG 269.0* 10/31/2014   HDL 32.60* 10/31/2014   LDLDIRECT 110.0 10/31/2014   ALT 26 10/31/2014   AST 23 10/31/2014   NA 140 10/31/2014   K 4.6 10/31/2014   CL 106 10/31/2014   CREATININE 0.92 10/31/2014   BUN 25* 10/31/2014   CO2 26 10/31/2014   TSH 1.41 10/31/2014   HGBA1C 6.0 04/07/2013   Pt had coffee this am  Had steroid injection

## 2014-11-14 ENCOUNTER — Other Ambulatory Visit: Payer: Self-pay

## 2014-11-14 DIAGNOSIS — Z9012 Acquired absence of left breast and nipple: Secondary | ICD-10-CM

## 2014-11-14 DIAGNOSIS — Z1231 Encounter for screening mammogram for malignant neoplasm of breast: Secondary | ICD-10-CM

## 2014-11-19 DIAGNOSIS — M1711 Unilateral primary osteoarthritis, right knee: Secondary | ICD-10-CM | POA: Diagnosis not present

## 2014-11-28 ENCOUNTER — Telehealth: Payer: Self-pay | Admitting: Internal Medicine

## 2014-11-28 NOTE — Telephone Encounter (Signed)
[place], [place] [phone]  Requesting refills of the following:  atenolol (TENORMIN) 50 MG tablet meclizine (ANTIVERT) 25 MG tablet

## 2014-11-28 NOTE — Telephone Encounter (Signed)
Okay to refill Antivert?  Patient is requesting 90 days

## 2014-11-29 MED ORDER — ATENOLOL 50 MG PO TABS: 50.0000 mg | ORAL_TABLET | Freq: Every day | ORAL | Status: DC

## 2014-11-29 NOTE — Telephone Encounter (Signed)
antivert is a high risk and  A high risk medication for falling  in the older population  . It is over the counter .   Decline to refill .   Ok to refill the atenolol  For 6 months

## 2014-12-07 DIAGNOSIS — M1711 Unilateral primary osteoarthritis, right knee: Secondary | ICD-10-CM | POA: Diagnosis not present

## 2014-12-17 ENCOUNTER — Encounter: Payer: Self-pay | Admitting: Family Medicine

## 2014-12-27 DIAGNOSIS — M25561 Pain in right knee: Secondary | ICD-10-CM | POA: Diagnosis not present

## 2014-12-27 DIAGNOSIS — M25562 Pain in left knee: Secondary | ICD-10-CM | POA: Diagnosis not present

## 2014-12-27 DIAGNOSIS — M170 Bilateral primary osteoarthritis of knee: Secondary | ICD-10-CM | POA: Diagnosis not present

## 2015-01-22 ENCOUNTER — Telehealth: Payer: Self-pay | Admitting: Internal Medicine

## 2015-01-22 DIAGNOSIS — M1710 Unilateral primary osteoarthritis, unspecified knee: Secondary | ICD-10-CM

## 2015-01-22 DIAGNOSIS — M179 Osteoarthritis of knee, unspecified: Secondary | ICD-10-CM

## 2015-01-22 NOTE — Telephone Encounter (Signed)
    i would suggest  That she go back to [facility] first. As advised .   Since she has been seen by 2 orthos Of she doesn't want to do that then  Can refer for diagnosis knee pain to the 3rd provider  Dx . End stage DJD knee  But dont know if referral  Is in network and may not get paid for.

## 2015-01-22 NOTE — Telephone Encounter (Signed)
Pt is trying to avoid knee surgery and would like to know if dr Regis Bill would refer her to dr Rikki Spearing phone # [phone] . Pt has [employer] ins

## 2015-01-22 NOTE — Telephone Encounter (Signed)
Spoke to [place].  Dr. Sharlett Iles is with [facility] in [place].  Pt will be going for injections (5) and physical therapy.  85% success rate.  Pt has bone on bone in her knee (x-rays by [facility]) .  Also has a slight fracture.  Cancelled her Monday appt with Dr. Rex Kras.  Seen by Tommi Rumps on 10/10/14 when knee pain started.  Ok to refer?  Dx code?

## 2015-01-23 NOTE — Telephone Encounter (Signed)
Spoke to [place] and the pt.  Pt does not want to go back and see Dr. Rex Kras at this time.  Wants to try [facility] first.  Order placed in the system.

## 2015-01-25 ENCOUNTER — Other Ambulatory Visit: Payer: Self-pay | Admitting: Internal Medicine

## 2015-01-28 NOTE — Telephone Encounter (Signed)
Sent to the pharmacy by e-scribe. 

## 2015-02-01 DIAGNOSIS — M25562 Pain in left knee: Secondary | ICD-10-CM | POA: Diagnosis not present

## 2015-02-01 DIAGNOSIS — M170 Bilateral primary osteoarthritis of knee: Secondary | ICD-10-CM | POA: Diagnosis not present

## 2015-02-01 DIAGNOSIS — M25561 Pain in right knee: Secondary | ICD-10-CM | POA: Diagnosis not present

## 2015-02-04 DIAGNOSIS — M1711 Unilateral primary osteoarthritis, right knee: Secondary | ICD-10-CM | POA: Diagnosis not present

## 2015-02-04 DIAGNOSIS — M170 Bilateral primary osteoarthritis of knee: Secondary | ICD-10-CM | POA: Diagnosis not present

## 2015-02-04 DIAGNOSIS — M25562 Pain in left knee: Secondary | ICD-10-CM | POA: Diagnosis not present

## 2015-02-04 DIAGNOSIS — M25561 Pain in right knee: Secondary | ICD-10-CM | POA: Diagnosis not present

## 2015-02-04 DIAGNOSIS — R2689 Other abnormalities of gait and mobility: Secondary | ICD-10-CM | POA: Diagnosis not present

## 2015-02-07 DIAGNOSIS — M1711 Unilateral primary osteoarthritis, right knee: Secondary | ICD-10-CM | POA: Diagnosis not present

## 2015-02-07 DIAGNOSIS — M25562 Pain in left knee: Secondary | ICD-10-CM | POA: Diagnosis not present

## 2015-02-07 DIAGNOSIS — M170 Bilateral primary osteoarthritis of knee: Secondary | ICD-10-CM | POA: Diagnosis not present

## 2015-02-07 DIAGNOSIS — R2689 Other abnormalities of gait and mobility: Secondary | ICD-10-CM | POA: Diagnosis not present

## 2015-02-07 DIAGNOSIS — M25561 Pain in right knee: Secondary | ICD-10-CM | POA: Diagnosis not present

## 2015-02-11 ENCOUNTER — Ambulatory Visit
Admission: RE | Admit: 2015-02-11 | Discharge: 2015-02-11 | Disposition: A | Discharge status: Home / Self Care | Payer: Commercial Managed Care - HMO | Source: Ambulatory Visit

## 2015-02-11 DIAGNOSIS — Z9012 Acquired absence of left breast and nipple: Secondary | ICD-10-CM

## 2015-02-11 DIAGNOSIS — Z1231 Encounter for screening mammogram for malignant neoplasm of breast: Secondary | ICD-10-CM

## 2015-02-13 DIAGNOSIS — M25562 Pain in left knee: Secondary | ICD-10-CM | POA: Diagnosis not present

## 2015-02-13 DIAGNOSIS — M1712 Unilateral primary osteoarthritis, left knee: Secondary | ICD-10-CM | POA: Diagnosis not present

## 2015-02-15 DIAGNOSIS — M25561 Pain in right knee: Secondary | ICD-10-CM | POA: Diagnosis not present

## 2015-02-15 DIAGNOSIS — M1711 Unilateral primary osteoarthritis, right knee: Secondary | ICD-10-CM | POA: Diagnosis not present

## 2015-02-15 DIAGNOSIS — M25562 Pain in left knee: Secondary | ICD-10-CM | POA: Diagnosis not present

## 2015-02-15 DIAGNOSIS — M170 Bilateral primary osteoarthritis of knee: Secondary | ICD-10-CM | POA: Diagnosis not present

## 2015-02-15 DIAGNOSIS — R2689 Other abnormalities of gait and mobility: Secondary | ICD-10-CM | POA: Diagnosis not present

## 2015-02-18 DIAGNOSIS — M170 Bilateral primary osteoarthritis of knee: Secondary | ICD-10-CM | POA: Diagnosis not present

## 2015-02-18 DIAGNOSIS — M25562 Pain in left knee: Secondary | ICD-10-CM | POA: Diagnosis not present

## 2015-02-18 DIAGNOSIS — R2689 Other abnormalities of gait and mobility: Secondary | ICD-10-CM | POA: Diagnosis not present

## 2015-02-18 DIAGNOSIS — M25561 Pain in right knee: Secondary | ICD-10-CM | POA: Diagnosis not present

## 2015-02-18 DIAGNOSIS — M1712 Unilateral primary osteoarthritis, left knee: Secondary | ICD-10-CM | POA: Diagnosis not present

## 2015-02-22 DIAGNOSIS — M170 Bilateral primary osteoarthritis of knee: Secondary | ICD-10-CM | POA: Diagnosis not present

## 2015-02-22 DIAGNOSIS — R2689 Other abnormalities of gait and mobility: Secondary | ICD-10-CM | POA: Diagnosis not present

## 2015-02-22 DIAGNOSIS — M1711 Unilateral primary osteoarthritis, right knee: Secondary | ICD-10-CM | POA: Diagnosis not present

## 2015-02-22 DIAGNOSIS — M25561 Pain in right knee: Secondary | ICD-10-CM | POA: Diagnosis not present

## 2015-02-22 DIAGNOSIS — M25562 Pain in left knee: Secondary | ICD-10-CM | POA: Diagnosis not present

## 2015-02-26 DIAGNOSIS — M1712 Unilateral primary osteoarthritis, left knee: Secondary | ICD-10-CM | POA: Diagnosis not present

## 2015-02-26 DIAGNOSIS — R2689 Other abnormalities of gait and mobility: Secondary | ICD-10-CM | POA: Diagnosis not present

## 2015-02-26 DIAGNOSIS — M25562 Pain in left knee: Secondary | ICD-10-CM | POA: Diagnosis not present

## 2015-02-26 DIAGNOSIS — M170 Bilateral primary osteoarthritis of knee: Secondary | ICD-10-CM | POA: Diagnosis not present

## 2015-02-26 DIAGNOSIS — M25561 Pain in right knee: Secondary | ICD-10-CM | POA: Diagnosis not present

## 2015-02-28 DIAGNOSIS — M25561 Pain in right knee: Secondary | ICD-10-CM | POA: Diagnosis not present

## 2015-02-28 DIAGNOSIS — M170 Bilateral primary osteoarthritis of knee: Secondary | ICD-10-CM | POA: Diagnosis not present

## 2015-02-28 DIAGNOSIS — M25562 Pain in left knee: Secondary | ICD-10-CM | POA: Diagnosis not present

## 2015-02-28 DIAGNOSIS — M1711 Unilateral primary osteoarthritis, right knee: Secondary | ICD-10-CM | POA: Diagnosis not present

## 2015-02-28 DIAGNOSIS — R2689 Other abnormalities of gait and mobility: Secondary | ICD-10-CM | POA: Diagnosis not present

## 2015-04-16 ENCOUNTER — Other Ambulatory Visit: Payer: Self-pay | Admitting: Internal Medicine

## 2015-04-16 NOTE — Telephone Encounter (Signed)
Filled on 01/28/15 for six months.  Request is early.

## 2015-04-23 DIAGNOSIS — H524 Presbyopia: Secondary | ICD-10-CM | POA: Diagnosis not present

## 2015-04-23 DIAGNOSIS — H5210 Myopia, unspecified eye: Secondary | ICD-10-CM | POA: Diagnosis not present

## 2015-04-23 DIAGNOSIS — Z0100 Encounter for examination of eyes and vision without abnormal findings: Secondary | ICD-10-CM | POA: Diagnosis not present

## 2015-04-30 ENCOUNTER — Ambulatory Visit (INDEPENDENT_AMBULATORY_CARE_PROVIDER_SITE_OTHER): Payer: Commercial Managed Care - HMO | Admitting: Internal Medicine

## 2015-04-30 ENCOUNTER — Ambulatory Visit (INDEPENDENT_AMBULATORY_CARE_PROVIDER_SITE_OTHER)
Admission: RE | Admit: 2015-04-30 | Discharge: 2015-04-30 | Disposition: A | Discharge status: Home / Self Care | Payer: Commercial Managed Care - HMO | Source: Ambulatory Visit | Attending: Internal Medicine | Admitting: Internal Medicine

## 2015-04-30 ENCOUNTER — Encounter: Payer: Self-pay | Admitting: Internal Medicine

## 2015-04-30 VITALS — BP 152/80 | HR 90 | Temp 100.0°F | Ht 63.0 in | Wt 175.1 lb

## 2015-04-30 DIAGNOSIS — Z974 Presence of external hearing-aid: Secondary | ICD-10-CM

## 2015-04-30 DIAGNOSIS — R6889 Other general symptoms and signs: Secondary | ICD-10-CM

## 2015-04-30 DIAGNOSIS — R509 Fever, unspecified: Secondary | ICD-10-CM

## 2015-04-30 DIAGNOSIS — IMO0001 Reserved for inherently not codable concepts without codable children: Secondary | ICD-10-CM

## 2015-04-30 DIAGNOSIS — R05 Cough: Secondary | ICD-10-CM | POA: Diagnosis not present

## 2015-04-30 DIAGNOSIS — R54 Age-related physical debility: Secondary | ICD-10-CM

## 2015-04-30 LAB — POCT INFLUENZA A/B
INFLUENZA A, POC: NEGATIVE
Influenza B, POC: NEGATIVE

## 2015-04-30 MED ORDER — HYDROCODONE-HOMATROPINE 5-1.5 MG/5ML PO SYRP: ORAL_SOLUTION | ORAL | Status: DC

## 2015-04-30 NOTE — Patient Instructions (Signed)
This acts like virus or flu like illness  Lungs  Sound good. Today but  Get x ray to  Make sure  No pneumonia . If ok then  Give this more time    Expect to feel better  In the bext 5 days   Cough can last  2 weeks .

## 2015-04-30 NOTE — Progress Notes (Signed)
Pre visit review using our clinic review tool, if applicable. No additional management support is needed unless otherwise documented below in the visit note.  Chief Complaint  Patient presents with  . Nausea    Started on Friday  . Generalized Body Aches  . Nasal Congestion  . Headache  . Cough    HPI: Patient Julie Boone  comes in today for SDA for  new problem evaluation. Onset Friday  4 days ago .   See above    Daughter had  siomilar illness  .  Didn't have flu vaccine this year. Onset 4 days ago congestion body aches headache cough her chest is sore from coughing. Using Robitussin given to her by her insurance company. No hemoptysis some sweats and chills but denies a fever. Decreased appetite no nausea vomiting diarrhea and no unusual rashes. ROS: See pertinent positives and negatives per HPI. Now has hearing aids no short of breath or wheeze  Past Medical History  Diagnosis Date  . Breast cancer ([place])     mastectomy 3   neg ln   . HTN (hypertension)   . Arthritis   . Wears hearing aid     both ears  . HOH (hard of hearing)   . Wears glasses     Family History  Problem Relation Age of Onset  . Hypertension Mother   . Stroke Mother   . Heart failure Mother   . Cancer Sister     lung   . Colon cancer Father     Social History   Social History  . Marital Status: Married    Spouse Name: N/A  . Number of Children: N/A  . Years of Education: N/A   Social History Main Topics  . Smoking status: Never Smoker   . Smokeless tobacco: Never Used  . Alcohol Use: Yes     Comment: occ  . Drug Use: No  . Sexual Activity: Not Asked   Other Topics Concern  . None   Social History Narrative   Usually receives 5 hours of sleep per night   3 people living in the home   Puppy  [place] and [place]   From [place] moved from [place]  Adopted daughter nearby.      Neg ets  Wine with meals  No tob rd.   G0P0   Married husband [place] background  Herself irish  descent.             Outpatient Prescriptions Prior to Visit  Medication Sig Dispense Refill  . acetaminophen (TYLENOL) 500 MG tablet Take 500 mg by mouth every 6 (six) hours as needed for moderate pain.    [name] aspirin 325 MG tablet Take 325-650 mg by mouth every 4 (four) hours as needed for moderate pain.     [name] atenolol (TENORMIN) 50 MG tablet Take 1 tablet (50 mg total) by mouth daily. 90 tablet 1  . CALCIUM-MAG-VIT C-VIT D PO Take 1 tablet by mouth daily.     . Coenzyme Q10 (CO Q 10 PO) Take 300 mg by mouth daily.     . Cyanocobalamin (VITAMIN B-12 CR PO) Take 1 tablet by mouth 2 (two) times daily.    . diclofenac sodium (VOLTAREN) 1 % GEL Apply 4 g topically 4 (four) times daily as needed. 4 Tube 1  . lisinopril (PRINIVIL,ZESTRIL) 40 MG tablet TAKE 1 TABLET EVERY DAY 90 tablet 1  . meclizine (ANTIVERT) 25 MG tablet Take 1 tablet (25 mg total)  by mouth every 6 (six) hours as needed for dizziness. 40 tablet 0  . naproxen sodium (ANAPROX) 220 MG tablet Take 220-440 mg by mouth 2 (two) times daily as needed (pain).    . vitamin C (ASCORBIC ACID) 500 MG tablet Take 500 mg by mouth daily.     [name] ibuprofen (ADVIL,MOTRIN) 200 MG tablet Take 200 mg by mouth every 6 (six) hours as needed for moderate pain.    [name] ondansetron (ZOFRAN) 4 MG tablet Take 1 tablet (4 mg total) by mouth every 8 (eight) hours as needed for nausea or vomiting. 12 tablet 0  . ondansetron (ZOFRAN) 4 MG tablet Take 1 tablet (4 mg total) by mouth every 8 (eight) hours as needed for nausea or vomiting. 20 tablet 0  . OVER THE COUNTER MEDICATION Apply 1 application topically daily as needed. Itching.  (Calamine Lotion)    . traMADol (ULTRAM) 50 MG tablet Take 1 tablet (50 mg total) by mouth every 6 (six) hours as needed. 15 tablet 0   No facility-administered medications prior to visit.     EXAM:  BP 152/80 mmHg  Pulse 90  Temp(Src) 100 F (37.8 C) (Oral)  Ht 5\' 3"  (1.6 m)  Wt 175 lb 1.6 oz (79.425 kg)  BMI 31.03 kg/m2   SpO2 97%  Body mass index is 31.03 kg/(m^2). WDWN in NAD  quiet respirations; mildly congested  somewhat hoarse. Non toxic . HEENT: Normocephalic ;atraumatic , Eyes;  PERRL, EOMs  Full, lids and conjunctiva clear,,Ears: no deformities, canals nl, TM landmarks normal, Nose: no deformity or discharge but congested;face minimally tender Mouth : OP clear without lesion or edema . Neck: Supple without adenopathy or masses or bruits Chest:  Clear to A&P without wheezes rales or rhonchi CV:  S1-S2 no gallops or murmurs peripheral perfusion is normal Skin :nl perfusion and no acute rashes   ASSESSMENT AND PLAN:  Discussed the following assessment and plan:  Flu-like symptoms - Plan: DG Chest 2 View  Age factor  Fever, unspecified - Plan: POC Influenza A/B, DG Chest 2 View  Uses hearing aid - has been helpful new She is 4-5 days into this illness and her symptoms are fairly mild check chest x-ray rule out pneumonia probably won't get much benefit from Tamiflu although I suspect influenza. She wasn't immunized in the vaccine is about 50% effective this year Risk-benefit of cough medicine discussed expectant management and return for evaluation for alarm symptoms. High risk because of age. -Patient advised to return or notify health care team  if symptoms worsen ,persist or new concerns arise.  Patient Instructions  This acts like virus or flu like illness  Lungs  Sound good. Today but  Get x ray to  Make sure  No pneumonia . If ok then  Give this more time    Expect to feel better  In the bext 5 days   Cough can last  2 weeks .       Standley Brooking. Panosh M.D.

## 2015-06-10 ENCOUNTER — Ambulatory Visit (INDEPENDENT_AMBULATORY_CARE_PROVIDER_SITE_OTHER): Payer: Commercial Managed Care - HMO | Admitting: Internal Medicine

## 2015-06-10 ENCOUNTER — Encounter: Payer: Self-pay | Admitting: Internal Medicine

## 2015-06-10 VITALS — BP 160/84 | HR 82 | Temp 98.2°F | Ht 63.0 in | Wt 170.0 lb

## 2015-06-10 DIAGNOSIS — Z9889 Other specified postprocedural states: Secondary | ICD-10-CM | POA: Diagnosis not present

## 2015-06-10 DIAGNOSIS — IMO0001 Reserved for inherently not codable concepts without codable children: Secondary | ICD-10-CM

## 2015-06-10 DIAGNOSIS — Z8719 Personal history of other diseases of the digestive system: Secondary | ICD-10-CM | POA: Diagnosis not present

## 2015-06-10 DIAGNOSIS — R1033 Periumbilical pain: Secondary | ICD-10-CM | POA: Diagnosis not present

## 2015-06-10 DIAGNOSIS — R030 Elevated blood-pressure reading, without diagnosis of hypertension: Secondary | ICD-10-CM

## 2015-06-10 LAB — POC URINALSYSI DIPSTICK (AUTOMATED)
Bilirubin, UA: NEGATIVE
Blood, UA: NEGATIVE
GLUCOSE UA: NEGATIVE
KETONES UA: NEGATIVE
Leukocytes, UA: NEGATIVE
Nitrite, UA: NEGATIVE
Protein, UA: NEGATIVE
Spec Grav, UA: >=1.03
Urobilinogen, UA: 0.2
pH, UA: 6

## 2015-06-10 MED ORDER — RANITIDINE HCL 150 MG PO TABS: 150.0000 mg | ORAL_TABLET | Freq: Two times a day (BID) | ORAL | Status: DC

## 2015-06-10 NOTE — Patient Instructions (Addendum)
Get blood test today and will other  Abdominal scan to check hernia are gall bladder etc.  Do not take asa or  Ibuprofen  Or aleve for now    Until we sort out the  Reason for the pain .   Please take   Ranitidine 150 mg 1 twice a day in the interim   Plan follow up  After these test are done  Or 2 weeks  Earlier if not improved .,

## 2015-06-10 NOTE — Progress Notes (Signed)
Chief Complaint  Patient presents with  . Abdominal Pain    HPI: Julie Boone 82 y.o.  comes in for acute problem SDA  Upper abd pain  Just above belly button for 10 days getting worse  More after eating   Felt  Worse this week . After eats and gets  stabbing pain  Like a "hot poker" same spot   Same  no vomiting  X nausea last week  Like a one day virus   took a med    needing  nsaids  Seldom  Use ocass   ROS: See pertinent positives and negatives per HPI. No fever  Hx of small umbi hernia  Ct scan 2013 fat only  Has had surgery . Takes asa not every day  No bleeding   Past Medical History  Diagnosis Date  . Breast cancer ([place])     mastectomy 3   neg ln   . HTN (hypertension)   . Arthritis   . Wears hearing aid     both ears  . HOH (hard of hearing)   . Wears glasses     Family History  Problem Relation Age of Onset  . Hypertension Mother   . Stroke Mother   . Heart failure Mother   . Cancer Sister     lung   . Colon cancer Father     Social History   Social History  . Marital Status: Married    Spouse Name: N/A  . Number of Children: N/A  . Years of Education: N/A   Social History Main Topics  . Smoking status: Never Smoker   . Smokeless tobacco: Never Used  . Alcohol Use: 0.0 oz/week    0 Standard drinks or equivalent per week     Comment: occ  . Drug Use: No  . Sexual Activity: Not Asked   Other Topics Concern  . None   Social History Narrative   Usually receives 5 hours of sleep per night   3 people living in the home   [place]   From [place] moved from [place]  Adopted daughter nearby.      Neg ets  Wine with meals  No tob rd.   G0P0   Married husband [place] background  Herself [place] descent.             Outpatient Prescriptions Prior to Visit  Medication Sig Dispense Refill  . aspirin 325 MG tablet Take 325-650 mg by mouth every 4 (four) hours as needed for moderate pain.     [name] atenolol (TENORMIN) 50 MG  tablet Take 1 tablet (50 mg total) by mouth daily. 90 tablet 1  . CALCIUM-MAG-VIT C-VIT D PO Take 1 tablet by mouth daily.     . Coenzyme Q10 (CO Q 10 PO) Take 300 mg by mouth daily.     . Cyanocobalamin (VITAMIN B-12 CR PO) Take 1 tablet by mouth 2 (two) times daily.    . diclofenac sodium (VOLTAREN) 1 % GEL Apply 4 g topically 4 (four) times daily as needed. 4 Tube 1  . lisinopril (PRINIVIL,ZESTRIL) 40 MG tablet TAKE 1 TABLET EVERY DAY 90 tablet 1  . meclizine (ANTIVERT) 25 MG tablet Take 1 tablet (25 mg total) by mouth every 6 (six) hours as needed for dizziness. 40 tablet 0  . naproxen sodium (ANAPROX) 220 MG tablet Take 220-440 mg by mouth 2 (two) times daily as needed (pain).    . vitamin  C (ASCORBIC ACID) 500 MG tablet Take 500 mg by mouth daily.     [name] acetaminophen (TYLENOL) 500 MG tablet Take 500 mg by mouth every 6 (six) hours as needed for moderate pain. Reported on 06/10/2015    . HYDROcodone-homatropine (HYCODAN) 5-1.5 MG/5ML syrup 1 tsp at night or every 4-6 hours if needed for cough (Patient not taking: Reported on 06/10/2015) 180 mL 0   No facility-administered medications prior to visit.     EXAM:  BP 160/84 mmHg  Pulse 82  Temp(Src) 98.2 F (36.8 C)  Ht 5\' 3"  (1.6 m)  Wt 170 lb (77.111 kg)  BMI 30.12 kg/m2  Body mass index is 30.12 kg/(m^2).  GENERAL: vitals reviewed and listed above, alert, oriented, appears well hydrated and in no acute distress hard of hearing  HEENT: atraumatic, conjunctiva  clear, no obvious abnormalities on inspection of external nose and ears NECK: no obvious masses on inspection palpation  LUNGS: clear to auscultation bilaterally, no wheezes, rales or rhonchi,  CV: HRRR, no clubbing cyanosis or  peripheral edema nl cap refill  Abdomen:  Sof,t normal bowel sounds without hepatosplenomegaly, no guarding rebound or masses no CVA tenderness  Tender above  Hernia  scare umbilical but no mass felt   bs present  No masses  MS: moves all extremities  without noticeable focal  abnormality PSYCH: pleasant and cooperative, cognition appears intact    BP Readings from Last 3 Encounters:  06/10/15 160/84  04/30/15 152/80  10/31/14 166/74   Wt Readings from Last 3 Encounters:  06/10/15 170 lb (77.111 kg)  04/30/15 175 lb 1.6 oz (79.425 kg)  10/31/14 170 lb 11.2 oz (77.429 kg)    ASSESSMENT AND PLAN:  Discussed the following assessment and plan:  Periumbilical abdominal pain - worse post prandiatl atypical  get scan labs and plan fu add ranitidine and stop all nsaids asa - Plan: POCT Urinalysis, Dipstick, Basic metabolic panel, CBC with Differential/Platelet, Hepatic function panel, TSH, Sedimentation rate, CT Abdomen Pelvis W Contrast, POCT Urinalysis Dipstick (Automated)  Hx of hernia repair - Plan: CT Abdomen Pelvis W Contrast  Elevated BP  -Patient advised to return or notify health care team  if symptoms worsen ,persist or new concerns arise.  Patient Instructions  Get blood test today and will other  Abdominal scan to check hernia are gall bladder etc.  Do not take asa or  Ibuprofen  Or aleve for now    Until we sort out the  Reason for the pain .   Please take   Ranitidine 150 mg 1 twice a day in the interim   Plan follow up  After these test are done  Or 2 weeks  Earlier if not improved .,    Standley Brooking. Lamont Tant M.D.

## 2015-06-11 ENCOUNTER — Ambulatory Visit (INDEPENDENT_AMBULATORY_CARE_PROVIDER_SITE_OTHER)
Admission: RE | Admit: 2015-06-11 | Discharge: 2015-06-11 | Disposition: A | Discharge status: Home / Self Care | Payer: Commercial Managed Care - HMO | Source: Ambulatory Visit | Attending: Internal Medicine | Admitting: Internal Medicine

## 2015-06-11 DIAGNOSIS — Z8719 Personal history of other diseases of the digestive system: Secondary | ICD-10-CM

## 2015-06-11 DIAGNOSIS — Z9889 Other specified postprocedural states: Secondary | ICD-10-CM

## 2015-06-11 DIAGNOSIS — R1033 Periumbilical pain: Secondary | ICD-10-CM | POA: Diagnosis not present

## 2015-06-11 DIAGNOSIS — K760 Fatty (change of) liver, not elsewhere classified: Secondary | ICD-10-CM | POA: Diagnosis not present

## 2015-06-11 LAB — BASIC METABOLIC PANEL
BUN: 26 mg/dL — ABNORMAL HIGH (ref 6–23)
CO2: 27 mEq/L (ref 19–32)
Calcium: 10.1 mg/dL (ref 8.4–10.5)
Chloride: 105 mEq/L (ref 96–112)
Creatinine, Ser: 0.88 mg/dL (ref 0.40–1.20)
GFR: 64.28 mL/min (ref >60.00)
Glucose, Bld: 105 mg/dL — ABNORMAL HIGH (ref 70–99)
Potassium: 4.6 mEq/L (ref 3.5–5.1)
Sodium: 140 mEq/L (ref 135–145)

## 2015-06-11 LAB — HEPATIC FUNCTION PANEL
ALT: 20 U/L (ref 0–35)
AST: 19 U/L (ref 0–37)
Albumin: 4.2 g/dL (ref 3.5–5.2)
Alkaline Phosphatase: 84 U/L (ref 39–117)
Bilirubin, Direct: 0.1 mg/dL (ref 0.0–0.3)
Total Bilirubin: 0.4 mg/dL (ref 0.2–1.2)
Total Protein: 6.7 g/dL (ref 6.0–8.3)

## 2015-06-11 LAB — TSH: TSH: 1.87 u[IU]/mL (ref 0.35–4.50)

## 2015-06-11 LAB — CBC WITH DIFFERENTIAL/PLATELET
Basophils Absolute: 0 10*3/uL (ref 0.0–0.1)
Basophils Relative: 0.2 % (ref 0.0–3.0)
EOS PCT: 4 % (ref 0.0–5.0)
Eosinophils Absolute: 0.4 10*3/uL (ref 0.0–0.7)
HEMATOCRIT: 43.1 % (ref 36.0–46.0)
HEMOGLOBIN: 14.7 g/dL (ref 12.0–15.0)
LYMPHS PCT: 21.3 % (ref 12.0–46.0)
Lymphs Abs: 2.1 10*3/uL (ref 0.7–4.0)
MCHC: 34.1 g/dL (ref 30.0–36.0)
MCV: 95.3 fl (ref 78.0–100.0)
Monocytes Absolute: 0.9 10*3/uL (ref 0.1–1.0)
Monocytes Relative: 8.7 % (ref 3.0–12.0)
Neutro Abs: 6.5 10*3/uL (ref 1.4–7.7)
Neutrophils Relative %: 65.8 % (ref 43.0–77.0)
Platelets: 204 10*3/uL (ref 150.0–400.0)
RBC: 4.52 Mil/uL (ref 3.87–5.11)
RDW: 13 % (ref 11.5–15.5)
WBC: 9.9 10*3/uL (ref 4.0–10.5)

## 2015-06-11 LAB — SEDIMENTATION RATE: Sed Rate: 11 mm/hr (ref 0–22)

## 2015-06-11 MED ORDER — IOPAMIDOL (ISOVUE-300) INJECTION 61%
100.0000 mL | Freq: Once | INTRAVENOUS | Status: AC | PRN
  Administered 2015-06-11: 100 mL via INTRAVENOUS

## 2015-06-12 ENCOUNTER — Telehealth: Payer: Self-pay | Admitting: Internal Medicine

## 2015-06-12 NOTE — Telephone Encounter (Signed)
Pt daughter  would like ct scan results

## 2015-06-12 NOTE — Telephone Encounter (Signed)
Pamala Hurry (daughter) given results by telephone.

## 2015-07-02 DIAGNOSIS — H9041 Sensorineural hearing loss, unilateral, right ear, with unrestricted hearing on the contralateral side: Secondary | ICD-10-CM | POA: Diagnosis not present

## 2015-07-02 DIAGNOSIS — H6121 Impacted cerumen, right ear: Secondary | ICD-10-CM | POA: Diagnosis not present

## 2015-09-11 DIAGNOSIS — M170 Bilateral primary osteoarthritis of knee: Secondary | ICD-10-CM | POA: Diagnosis not present

## 2015-09-11 DIAGNOSIS — M25562 Pain in left knee: Secondary | ICD-10-CM | POA: Diagnosis not present

## 2015-09-11 DIAGNOSIS — M25561 Pain in right knee: Secondary | ICD-10-CM | POA: Diagnosis not present

## 2015-09-11 DIAGNOSIS — R262 Difficulty in walking, not elsewhere classified: Secondary | ICD-10-CM | POA: Diagnosis not present

## 2015-09-17 DIAGNOSIS — M25561 Pain in right knee: Secondary | ICD-10-CM | POA: Diagnosis not present

## 2015-09-17 DIAGNOSIS — M170 Bilateral primary osteoarthritis of knee: Secondary | ICD-10-CM | POA: Diagnosis not present

## 2015-09-17 DIAGNOSIS — M25562 Pain in left knee: Secondary | ICD-10-CM | POA: Diagnosis not present

## 2015-09-26 DIAGNOSIS — M25561 Pain in right knee: Secondary | ICD-10-CM | POA: Diagnosis not present

## 2015-09-26 DIAGNOSIS — M25562 Pain in left knee: Secondary | ICD-10-CM | POA: Diagnosis not present

## 2015-09-26 DIAGNOSIS — M170 Bilateral primary osteoarthritis of knee: Secondary | ICD-10-CM | POA: Diagnosis not present

## 2015-10-02 DIAGNOSIS — M25562 Pain in left knee: Secondary | ICD-10-CM | POA: Diagnosis not present

## 2015-10-02 DIAGNOSIS — M170 Bilateral primary osteoarthritis of knee: Secondary | ICD-10-CM | POA: Diagnosis not present

## 2015-10-02 DIAGNOSIS — M25561 Pain in right knee: Secondary | ICD-10-CM | POA: Diagnosis not present

## 2015-10-09 DIAGNOSIS — M25561 Pain in right knee: Secondary | ICD-10-CM | POA: Diagnosis not present

## 2015-10-09 DIAGNOSIS — M25562 Pain in left knee: Secondary | ICD-10-CM | POA: Diagnosis not present

## 2015-10-09 DIAGNOSIS — M170 Bilateral primary osteoarthritis of knee: Secondary | ICD-10-CM | POA: Diagnosis not present

## 2015-10-29 DIAGNOSIS — M25561 Pain in right knee: Secondary | ICD-10-CM | POA: Diagnosis not present

## 2015-10-29 DIAGNOSIS — M170 Bilateral primary osteoarthritis of knee: Secondary | ICD-10-CM | POA: Diagnosis not present

## 2015-10-29 DIAGNOSIS — M25562 Pain in left knee: Secondary | ICD-10-CM | POA: Diagnosis not present

## 2015-11-18 ENCOUNTER — Emergency Department (HOSPITAL_COMMUNITY)
Admission: EM | Admit: 2015-11-18 | Discharge: 2015-11-18 | Disposition: A | Discharge status: Home / Self Care | Payer: Commercial Managed Care - HMO | Attending: Emergency Medicine | Admitting: Emergency Medicine

## 2015-11-18 ENCOUNTER — Encounter (HOSPITAL_COMMUNITY): Payer: Self-pay | Admitting: Emergency Medicine

## 2015-11-18 DIAGNOSIS — R55 Syncope and collapse: Secondary | ICD-10-CM | POA: Diagnosis not present

## 2015-11-18 DIAGNOSIS — R600 Localized edema: Secondary | ICD-10-CM | POA: Insufficient documentation

## 2015-11-18 DIAGNOSIS — Z79899 Other long term (current) drug therapy: Secondary | ICD-10-CM | POA: Insufficient documentation

## 2015-11-18 DIAGNOSIS — Z7982 Long term (current) use of aspirin: Secondary | ICD-10-CM | POA: Diagnosis not present

## 2015-11-18 DIAGNOSIS — I4891 Unspecified atrial fibrillation: Secondary | ICD-10-CM | POA: Insufficient documentation

## 2015-11-18 DIAGNOSIS — Z853 Personal history of malignant neoplasm of breast: Secondary | ICD-10-CM | POA: Diagnosis not present

## 2015-11-18 DIAGNOSIS — I10 Essential (primary) hypertension: Secondary | ICD-10-CM | POA: Diagnosis not present

## 2015-11-18 LAB — URINALYSIS, ROUTINE W REFLEX MICROSCOPIC
Bilirubin Urine: NEGATIVE
GLUCOSE, UA: NEGATIVE mg/dL
HGB URINE DIPSTICK: NEGATIVE
Ketones, ur: NEGATIVE mg/dL
Leukocytes, UA: NEGATIVE
Nitrite: NEGATIVE
PH: 6 (ref 5.0–8.0)
Protein, ur: NEGATIVE mg/dL
SPECIFIC GRAVITY, URINE: 1.017 (ref 1.005–1.030)

## 2015-11-18 LAB — CBC
HEMATOCRIT: 43.7 % (ref 36.0–46.0)
HEMOGLOBIN: 14.7 g/dL (ref 12.0–15.0)
MCH: 32.5 pg (ref 26.0–34.0)
MCHC: 33.6 g/dL (ref 30.0–36.0)
MCV: 96.5 fL (ref 78.0–100.0)
Platelets: 222 10*3/uL (ref 150–400)
RBC: 4.53 MIL/uL (ref 3.87–5.11)
RDW: 12.6 % (ref 11.5–15.5)
WBC: 10.5 10*3/uL (ref 4.0–10.5)

## 2015-11-18 LAB — BASIC METABOLIC PANEL
ANION GAP: 7 (ref 5–15)
BUN: 19 mg/dL (ref 6–20)
CHLORIDE: 110 mmol/L (ref 101–111)
CO2: 24 mmol/L (ref 22–32)
CREATININE: 0.98 mg/dL (ref 0.44–1.00)
Calcium: 9.8 mg/dL (ref 8.9–10.3)
GFR calc non Af Amer: 50 mL/min — ABNORMAL LOW (ref >60)
GFR, EST AFRICAN AMERICAN: 58 mL/min — AB (ref >60)
Glucose, Bld: 133 mg/dL — ABNORMAL HIGH (ref 65–99)
POTASSIUM: 3.9 mmol/L (ref 3.5–5.1)
Sodium: 141 mmol/L (ref 135–145)

## 2015-11-18 LAB — CBG MONITORING, ED: Glucose-Capillary: 148 mg/dL — ABNORMAL HIGH (ref 65–99)

## 2015-11-18 MED ORDER — SODIUM CHLORIDE 0.9 % IV BOLUS (SEPSIS): 500.0000 mL | Freq: Once | INTRAVENOUS | Status: DC

## 2015-11-18 NOTE — ED Notes (Signed)
Pt ambulatory and independent at discharge.  Verbalized understanding of discharge instructions 

## 2015-11-18 NOTE — Discharge Instructions (Signed)
Get plenty of rest and drink a lot of fluids.  Call the cardiologist for an appointment. Tell him that you had a transient episode of atrial fibrillation. Also tell him you may have had this previously, as well.  Return here if needed for problems.

## 2015-11-18 NOTE — ED Provider Notes (Signed)
[facility] DEPT Provider Note   CSN: [id number] Arrival date & time: 11/18/15  1532     History   Chief Complaint Chief Complaint  Patient presents with  . Dizziness  . Near Syncope    HPI Julie Boone is a 82 y.o. female.  She presents for a sensation of sudden onset of near syncope. This occurred earlier today while she was sitting at home. She is not sure what time it happened. She called her daughter who felt that the patient was in distress, went to her home. On arrival to daughter found her sitting in a recliner, alert and communicative. According to the daughter, the patient has had this several times previously. Patient feels better, upon evaluation. She denies recent fever, chills, cough, shortness of breath, chest pain, focal weakness or paresthesias. She is taking her usual medications. She does not recall being diagnosed for any problems associated with her prior episodes of near syncope. There are no other known modifying factors.  HPI  Past Medical History:  Diagnosis Date  . Arthritis   . Breast cancer ([place])    mastectomy 3   neg ln   . HOH (hard of hearing)   . HTN (hypertension)   . Wears glasses   . Wears hearing aid    both ears    Patient Active Problem List   Diagnosis Date Noted  . TMJ syndrome 01/22/2014  . Post-operative state 09/22/2013  . Abnormal chest x-ray 09/22/2013  . Allergic rhinitis 06/23/2013  . Acute sinusitis with symptoms greater than 10 days 06/23/2013  . Wax in ear 06/23/2013  . Abnormal thyroid blood test 04/14/2013  . Elevated blood sugar 04/14/2013  . Decreased hearing 04/14/2013  . Wears hearing aid 03/03/2013  . Unspecified essential hypertension 03/03/2013  . Leg cramps 03/03/2013  . Influenza vaccination declined by patient 03/03/2013  . Pneumococcal vaccination declined by patient 03/03/2013  . Breast cancer [facility])     Past Surgical History:  Procedure Laterality Date  . APPENDECTOMY  2006  . BREAST  BIOPSY  2008   left mast  . COLONOSCOPY    . HERNIA REPAIR    . INSERTION OF MESH N/A 09/05/2013   Procedure: INSERTION OF MESH;  Surgeon: Joyice Faster. Cornett, MD;  Location: [facility];  Service: General;  Laterality: N/A;  . TONSILLECTOMY AND ADENOIDECTOMY     Childhood  . UMBILICAL HERNIA REPAIR N/A 09/05/2013   Procedure: HERNIA REPAIR UMBILICAL ADULT WITH MESH;  Surgeon: Joyice Faster. Cornett, MD;  Location: [facility];  Service: General;  Laterality: N/A;    OB History    Gravida Para Term Preterm AB Living   0 0 0 0 0 0   SAB TAB Ectopic Multiple Live Births   0 0 0 0         Home Medications    Prior to Admission medications   Medication Sig Start Date End Date Taking? Authorizing Provider  acetaminophen (TYLENOL) 500 MG tablet Take 500 mg by mouth every 6 (six) hours as needed for moderate pain. Reported on 06/10/2015   Yes Historical Provider, MD  aspirin 325 MG tablet Take 325-650 mg by mouth every 4 (four) hours as needed for moderate pain.    Yes Historical Provider, MD  atenolol (TENORMIN) 50 MG tablet Take 1 tablet (50 mg total) by mouth daily. Patient taking differently: Take 50 mg by mouth at bedtime.  11/29/14  Yes Burnis Medin, MD  b complex vitamins  tablet Take 1 tablet by mouth daily.   Yes Historical Provider, MD  CALCIUM PO Take 1 tablet by mouth daily.   Yes Historical Provider, MD  Coenzyme Q10 (CO Q 10 PO) Take 1 tablet by mouth daily.    Yes Historical Provider, MD  diphenhydrAMINE (BENADRYL) 25 MG tablet Take 25 mg by mouth at bedtime as needed for allergies or sleep.   Yes Historical Provider, MD  Docusate Calcium (STOOL SOFTENER PO) Take 1 tablet by mouth at bedtime.   Yes Historical Provider, MD  lisinopril (PRINIVIL,ZESTRIL) 40 MG tablet TAKE 1 TABLET EVERY DAY 01/28/15  Yes Burnis Medin, MD  MAGNESIUM PO Take 1 tablet by mouth daily.   Yes Historical Provider, MD  meclizine (ANTIVERT) 25 MG tablet Take 1 tablet (25 mg total)  by mouth every 6 (six) hours as needed for dizziness. 02/19/14  Yes Rolland Porter, MD  vitamin C (ASCORBIC ACID) 500 MG tablet Take 500 mg by mouth daily.    Yes Historical Provider, MD    Family History Family History  Problem Relation Age of Onset  . Hypertension Mother   . Stroke Mother   . Heart failure Mother   . Cancer Sister     lung   . Colon cancer Father     Social History Social History  Substance Use Topics  . Smoking status: Never Smoker  . Smokeless tobacco: Never Used  . Alcohol use 0.0 oz/week     Comment: occ     Allergies   Review of patient's allergies indicates no known allergies.   Review of Systems Review of Systems  All other systems reviewed and are negative.    Physical Exam Updated Vital Signs BP 130/74 (BP Location: Right Arm)   Pulse 97   Temp 98.6 F (37 C) (Oral)   Resp 19   SpO2 98%   Physical Exam  Constitutional: She is oriented to person, place, and time. She appears well-developed and well-nourished.  Elderly, frail  HENT:  Head: Normocephalic and atraumatic.  Eyes: Conjunctivae and EOM are normal. Pupils are equal, round, and reactive to light.  Neck: Normal range of motion and phonation normal. Neck supple.  Cardiovascular:  Irregular tachycardia  Pulmonary/Chest: Effort normal and breath sounds normal. She exhibits no tenderness.  Abdominal: Soft. She exhibits no distension. There is no tenderness. There is no guarding.  Musculoskeletal: Normal range of motion. She exhibits edema (1+ lower legs bilaterally).  Neurological: She is alert and oriented to person, place, and time. She exhibits normal muscle tone.  Skin: Skin is warm and dry.  Psychiatric: She has a normal mood and affect. Her behavior is normal. Judgment and thought content normal.  Nursing note and vitals reviewed.    ED Treatments / Results  Labs (all labs ordered are listed, but only abnormal results are displayed) Labs Reviewed  BASIC METABOLIC PANEL  - Abnormal; Notable for the following:       Result Value   Glucose, Bld 133 (*)    GFR calc non Af Amer 50 (*)    GFR calc Af Amer 58 (*)    All other components within normal limits  CBG MONITORING, ED - Abnormal; Notable for the following:    Glucose-Capillary 148 (*)    All other components within normal limits  CBC  URINALYSIS, ROUTINE W REFLEX MICROSCOPIC (NOT AT [facility])    EKG  EKG Interpretation  Date/Time:  Monday November 18 2015 18:19:17 EDT Ventricular Rate:  84 PR  Interval:    QRS Duration: 93 QT Interval:  378 QTC Calculation: 447 R Axis:   -42 Text Interpretation:  Sinus rhythm Left axis deviation Abnormal R-wave progression, early transition Since last tracing of earlier today has converted to  Normal sinus rhythm Confirmed by Eulis Foster  MD, Vira Agar [id number]) on 11/18/2015 6:27:28 PM        Radiology No results found.  Procedures Procedures (including critical care time)  Medications Ordered in ED Medications  sodium chloride 0.9 % bolus 500 mL ( Intravenous Canceled Entry 11/18/15 1917)     Initial Impression / Assessment and Plan / ED Course  I have reviewed the triage vital signs and the nursing notes.  Pertinent labs & imaging results that were available during my care of the patient were reviewed by me and considered in my medical decision making (see chart for details).  Clinical Course  Comment By Time  Heart rate is now 80-90 and regular. Repeat EKG done and shows normal sinus rhythm. Patient is thirsty and she is given oral fluids. Daleen Bo, MD 09/18 1845    Medications  sodium chloride 0.9 % bolus 500 mL ( Intravenous Canceled Entry 11/18/15 1917)    Patient Vitals for the past 24 hrs:  BP Temp Temp src Pulse Resp SpO2  11/18/15 1826 - - - - 19 98 %  11/18/15 1701 130/74 - - 97 24 96 %  11/18/15 1545 (!) 162/112 98.6 F (37 C) Oral (!) 122 20 97 %    8:01 PM Reevaluation with update and discussion. After initial assessment and  treatment, an updated evaluation reveals She feels better now. Heart rate stabilized. Orthostatic blood pressure, pulses normal. Findings discussed with patient and daughter, all questions answered. Nimra Puccinelli L    Final Clinical Impressions(s) / ED Diagnoses   Final diagnoses:  Near syncope  Atrial fibrillation, unspecified type ([place])    Near-syncope associated with an episode of rapid atrial fibrillation. A. fib, spontaneously converted to normal sinus rhythm, during evaluation in the emergency department, and maintained that. Doubt ACS, PE, pneumonia.  Nursing Notes Reviewed/ Care Coordinated Applicable Imaging Reviewed Interpretation of Laboratory Data incorporated into ED treatment  The patient appears reasonably screened and/or stabilized for discharge and I doubt any other medical condition or other [facility] requiring further screening, evaluation, or treatment in the ED at this time prior to discharge.  Plan: Home Medications- continue; Home Treatments- rest, fluids; return here if the recommended treatment, does not improve the symptoms; Recommended follow up- PCP prn. Cardiology 1-2 weeks for evaluation or possible paroxysmal AF    New Prescriptions New Prescriptions   No medications on file     Daleen Bo, MD 11/18/15 2008

## 2015-11-18 NOTE — ED Notes (Signed)
Patient ambulated to bathroom and back without assistance and denied dizziness.

## 2015-11-18 NOTE — ED Notes (Signed)
Bed: WA03 Expected date:  Expected time:  Means of arrival:  Comments: Held t9

## 2015-11-18 NOTE — ED Triage Notes (Signed)
Pt was sitting at her desk about 30 minutes ago and sts the everything went black, she became diaphoretic, nauseous, and felt like she was going to pass out. Pt sts it didn't last long. She was able to call her daughter to come help her. Pt A&Ox4 at this time. C/o lingering lightheadedness and feeling like her heart is racing. Denies chest pain.

## 2015-11-22 ENCOUNTER — Encounter: Payer: Self-pay | Admitting: Physician Assistant

## 2015-11-25 ENCOUNTER — Ambulatory Visit (INDEPENDENT_AMBULATORY_CARE_PROVIDER_SITE_OTHER): Payer: Commercial Managed Care - HMO | Admitting: Physician Assistant

## 2015-11-25 ENCOUNTER — Encounter: Payer: Self-pay | Admitting: Physician Assistant

## 2015-11-25 VITALS — BP 156/88 | HR 95 | Ht 65.0 in | Wt 170.8 lb

## 2015-11-25 DIAGNOSIS — I10 Essential (primary) hypertension: Secondary | ICD-10-CM

## 2015-11-25 DIAGNOSIS — I480 Paroxysmal atrial fibrillation: Secondary | ICD-10-CM

## 2015-11-25 DIAGNOSIS — R42 Dizziness and giddiness: Secondary | ICD-10-CM

## 2015-11-25 MED ORDER — APIXABAN 2.5 MG PO TABS: 5.0000 mg | ORAL_TABLET | Freq: Two times a day (BID) | ORAL | Status: DC

## 2015-11-25 MED ORDER — ATENOLOL 50 MG PO TABS: 75.0000 mg | ORAL_TABLET | Freq: Every day | ORAL | 1 refills | Status: DC

## 2015-11-25 NOTE — Progress Notes (Signed)
Cardiology Office Note    Date:  11/25/2015   ID:  Julie Boone, DOB [id number], MRN [id number]  PCP:  Lottie Dawson, MD  Cardiologist:  New - seen with DOD Dr. Stanford Breed  Chief Complaint  Patient presents with  . Hospitalization Follow-up    presyncope and atrial fibrillation, seen with DOD Dr. Stanford Breed    History of Present Illness:  Julie Boone is a 82 y.o. female with PMH of HTN who was recently seen in the emergency room for sudden onset of near syncope. It appears patient was in rapid atrial fibrillation on initial arrival in the emergency room, however spontaneously converted to normal sinus rhythm. It was felt that her near syncope is related to the fast A. Fib. She was discharged from the emergency room for outpatient cardiology evaluation of paroxysmal atrial fibrillation. Laboratory workup were obtained in the emergency room was negative. Urinalysis showed no sign of urinary tract infection. Renal function, electrolytes, white blood cell and red blood cell count were all normal. No TSH was obtained during the ED visit, however she just recently had a TSH obtained on 06/10/2015 which was 1.87 and normal.  She has been taking atenolol 50 mg daily even prior to recently ED visit. No change in beta blocker dosage was made by ED physician. Patient presents today for cardiology office visit. She says she has dizziness for the past several month even prior to this episode. However this is the first time she truly felt she was going to pass out. She says her previous dizziness was occurring on a weekly basis. He can do occur at any time, does not matter if she is exerting herself or at rest. She has no exertional chest discomfort or shortness breath. Otherwise she has been doing very well, her daughter says for a 70 year old, she is very active able to clean the house and also working in the yard by herself. She does have some imbalance, however fortunately has never had a  fall. She did also denies any serious bleeding issues, other than occasional hemorrhoid. She also says her atenolol dose has been the same for the past 20 years along with her lisinopril for which she has also been on that for at least 15 years.  She was sitting in a chair hanging up the phone when she felt some dizziness over her head. She described it as a feeling of water fall dropping on her head. She also says she was unable to see out of both eyes for a few seconds. However she did not lose her consciousness. She quickly regained her eyesight, and has no further symptom afterward. She denies any palpitation, chest discomfort or shortness of breath prior to or after the event. Workup in the ED has been negative. Since she was released from the emergency room, she has no further symptom. Review of previous CT of the chest shows no sign of significant coronary calcification.  Note, patient was recently widowed, her husband passed away 3 months ago.   Past Medical History:  Diagnosis Date  . Arthritis   . Breast cancer ([place])    mastectomy 3   neg ln   . HOH (hard of hearing)   . HTN (hypertension)   . Wears glasses   . Wears hearing aid    both ears    Past Surgical History:  Procedure Laterality Date  . APPENDECTOMY  2006  . BREAST BIOPSY  2008   left mast  . COLONOSCOPY    .  HERNIA REPAIR    . INSERTION OF MESH N/A 09/05/2013   Procedure: INSERTION OF MESH;  Surgeon: Joyice Faster. Cornett, MD;  Location: [facility];  Service: General;  Laterality: N/A;  . TONSILLECTOMY AND ADENOIDECTOMY     Childhood  . UMBILICAL HERNIA REPAIR N/A 09/05/2013   Procedure: HERNIA REPAIR UMBILICAL ADULT WITH MESH;  Surgeon: Joyice Faster. Cornett, MD;  Location: [facility];  Service: General;  Laterality: N/A;    Current Medications: Outpatient Medications Prior to Visit  Medication Sig Dispense Refill  . acetaminophen (TYLENOL) 500 MG tablet Take 500 mg by mouth every 6 (six)  hours as needed for moderate pain. Reported on 06/10/2015    . CALCIUM PO Take 1 tablet by mouth daily.    . Coenzyme Q10 (CO Q 10 PO) Take 1 tablet by mouth daily.     Mariane Baumgarten Calcium (STOOL SOFTENER PO) Take 1 tablet by mouth at bedtime.    [name] lisinopril (PRINIVIL,ZESTRIL) 40 MG tablet TAKE 1 TABLET EVERY DAY 90 tablet 1  . meclizine (ANTIVERT) 25 MG tablet Take 1 tablet (25 mg total) by mouth every 6 (six) hours as needed for dizziness. 40 tablet 0  . vitamin C (ASCORBIC ACID) 500 MG tablet Take 500 mg by mouth daily.     [name] aspirin 325 MG tablet Take 325-650 mg by mouth every 4 (four) hours as needed for moderate pain.     [name] atenolol (TENORMIN) 50 MG tablet Take 1 tablet (50 mg total) by mouth daily. (Patient taking differently: Take 50 mg by mouth at bedtime. ) 90 tablet 1  . b complex vitamins tablet Take 1 tablet by mouth daily.    . diphenhydrAMINE (BENADRYL) 25 MG tablet Take 25 mg by mouth at bedtime as needed for allergies or sleep.    [name] MAGNESIUM PO Take 1 tablet by mouth daily.     No facility-administered medications prior to visit.      Allergies:   Review of patient's allergies indicates no known allergies.   Social History   Social History  . Marital status: Married    Spouse name: N/A  . Number of children: N/A  . Years of education: N/A   Social History Main Topics  . Smoking status: Never Smoker  . Smokeless tobacco: Never Used  . Alcohol use 0.0 oz/week     Comment: occ  . Drug use: No  . Sexual activity: Not Asked   Other Topics Concern  . None   Social History Narrative   Usually receives 5 hours of sleep per night   3 people living in the home   Puppy  smakk [place] and [place]   From [place] moved from [place]  Adopted daughter nearby.      Neg ets  Wine with meals  No tob rd.   G0P0   Married husband [place] background  [place] descent.              Family History:  The patient's family history includes Cancer in her sister; Colon cancer  in her father; Heart failure in her mother; Hypertension in her mother; Stroke in her mother.   ROS:   Please see the history of present illness.    ROS All other systems reviewed and are negative.   PHYSICAL EXAM:   VS:  BP (!) 156/88   Pulse 95   Ht 5\' 5"  (1.651 m)   Wt 170 lb 12.8 oz (77.5 kg)  BMI 28.42 kg/m    GEN: Well nourished, well developed, in no acute distress  HEENT: normal  Neck: no JVD, carotid bruits, or masses Cardiac: RRR; no murmurs, rubs, or gallops,no edema  Respiratory:  clear to auscultation bilaterally, normal work of breathing GI: soft, nontender, nondistended, + BS MS: no deformity or atrophy  Skin: warm and dry, no rash Neuro:  Alert and Oriented x 3, Strength and sensation are intact Psych: euthymic mood, full affect  Wt Readings from Last 3 Encounters:  11/25/15 170 lb 12.8 oz (77.5 kg)  06/10/15 170 lb (77.1 kg)  04/30/15 175 lb 1.6 oz (79.4 kg)      Studies/Labs Reviewed:   EKG:  EKG is ordered today.  The ekg ordered today demonstrates NSR without significant   Recent Labs: 06/10/2015: ALT 20; TSH 1.87 11/18/2015: BUN 19; Creatinine, Ser 0.98; Hemoglobin 14.7; Platelets 222; Potassium 3.9; Sodium 141   Lipid Panel    Component Value Date/Time   CHOL 204 (H) 10/31/2014 1105   TRIG 269.0 (H) 10/31/2014 1105   HDL 32.60 (L) 10/31/2014 1105   CHOLHDL 6 10/31/2014 1105   VLDL 53.8 (H) 10/31/2014 1105   LDLDIRECT 110.0 10/31/2014 1105    Additional studies/ records that were reviewed today include:   LE venous doppler 10/10/2014 Summary:  - No evidence of deep vein thrombosis involving the right lower   extremity. - No evidence of Baker&'s cyst on the right   CT of abdomen 06/11/2015 IMPRESSION: 1. No acute abdominal or pelvic pathology. 2. Hepatic steatosis.    ASSESSMENT:    1. Paroxysmal atrial fibrillation (HCC)   2. Essential hypertension   3. Dizziness      PLAN:  In order of problems listed  above:  1. Paroxysmal atrial fibrillation  - This patients CHA2DS2-VASc Score and unadjusted Ischemic Stroke Rate (% per year) is equal to 4.8 % stroke rate/year from a score of 4  Above score calculated as 1 point each if present [CHF, HTN, DM, Vascular=MI/PAD/Aortic Plaque, Age if 65-74, or Female] Above score calculated as 2 points each if present [Age > 75, or Stroke/TIA/TE]  - she has been taking atenolol 50mg  daily for 20 years, will increase to 75mg  daily.   - Obtain Echocardiogram.   - Unclear if her recent dizziness/presyncope is related to paroxysmal atrial fibrillation, will arrange for 30 day event monitor.  - she has no significant bleeding, despite some imbalance and occasional dizziness, she has no falls. I has discussed with patient and her family regarding benefit and risk of coumadin, eliquis and Xarelto.   - Will start on 5 mg twice a day of eliquis. Stop ASA. TSH was recently checked.   - check CBC/BMET in 4 weeks, and followup in 6-8 weeks  2. Dizziness/presyncope: Per patient, has weekly onset of intermittent dizziness, however never had presyncope until prior to recent ED visit. Unclear if related to atrial fibrillation. I have discussed with Dr. Stanford Breed, we will arrange for 30 day event monitor.  3. HTN: BP has been elevated in the 150s.    Medication Adjustments/Labs and Tests Ordered: Current medicines are reviewed at length with the patient today.  Concerns regarding medicines are outlined above.  Medication changes, Labs and Tests ordered today are listed in the Patient Instructions below. There are no Patient Instructions on file for this visit.   Hilbert Corrigan, [place]  11/25/2015 10:38 AM    [place], [place]  [id number] Phone: [phone]; Fax: [phone]   As above, patient seen and examined. Briefly she is an 82 year old female with past medical history of breast cancer, hypertension for evaluation of  atrial fibrillation. Patient was recently seen in the emergency room after developing dizziness or near syncope. There is no associated chest pain, dyspnea, palpitations or syncope. She was noted to be in atrial fibrillation but converted spontaneously to sinus rhythm. She otherwise denies dyspnea on exertion, orthopnea, PND, pedal edema, chest pain or syncope. She does have occasions of dizziness that had been present for years not associated palpitations. I agree with past medical history, social history, past surgical history, family history as outlined. Physical exam she is well-developed and well-nourished in no acute distress. Her skin is warm and dry. She does not appear to be depressed and there is no peripheral clubbing. Her back is normal. Her HEENT is normal with normal eyelids. Her neck is supple with normal upstroke and no bruits. Chest clear to auscultation. Cardiovascular exam reveals regular rate and rhythm, no murmurs. Abdominal exam shows no tenderness. Extremities show trace edema with varicosities. Neurological exam is grossly intact. Electrocardiogram in the emergency room showed atrial fibrillation. 1 atrial fibrillation-patient remains in sinus rhythm today. Embolic risk factors include age greater than 13, female sex and hypertension. CHADS vasc 4. Discontinue aspirin and begin apixaban 5 BID; check hemoglobin and renal function in 4 weeks. Increase atenolol to 75 mg daily. Schedule echocardiogram. Recent TSH normal.  2 presyncope-etiology unclear. Given chronicity without arrhythmia but will arrange event monitor to exclude posttermination pauses.  3 hypertension-blood pressure is mildly elevated. We will increase atenolol both for heart rate control if atrial fibrillation recurs and for blood pressure.  Kirk Ruths, MD

## 2015-11-25 NOTE — Patient Instructions (Signed)
Medication Instructions:  Your physician has recommended you make the following change in your medication:  1) STOP Asprin  2) START Eliquis 5 mg by mouth TWICE daily - 2 weeks of samples given 3) INCREASE Atenolol to 75 mg by mouth ONCE daily    Labwork: Your physician recommends that you return for lab work in: 4 weeks ( The lab can be found on the FIRST FLOOR of out building in [address]   Testing/Procedures: Your physician has recommended that you wear an event monitor. 30 days - Event monitors are medical devices that record the heart's electrical activity. Doctors most often [place] these monitors to diagnose arrhythmias. Arrhythmias are problems with the speed or rhythm of the heartbeat. The monitor is a small, portable device. You can wear one while you do your normal daily activities. This is usually used to diagnose what is causing palpitations/syncope (passing out). [place] appointment  Your physician has requested that you have an echocardiogram. Echocardiography is a painless test that uses sound waves to create images of your heart. It provides your doctor with information about the size and shape of your heart and how well your heart's chambers and valves are working. This procedure takes approximately one hour. There are no restrictions for this procedure.    Follow-Up: Your physician recommends that you schedule a follow-up appointment in: 6-8 weeks with Dr. Stanford Breed.   Any Other Special Instructions Will Be Listed Below (If Applicable).     If you need a refill on your cardiac medications before your next appointment, please call your pharmacy.

## 2015-11-27 ENCOUNTER — Other Ambulatory Visit: Payer: Self-pay | Admitting: Internal Medicine

## 2015-11-28 ENCOUNTER — Other Ambulatory Visit: Payer: Self-pay | Admitting: *Deleted

## 2015-11-28 ENCOUNTER — Telehealth: Payer: Self-pay | Admitting: Internal Medicine

## 2015-11-28 MED ORDER — ATENOLOL 50 MG PO TABS: 75.0000 mg | ORAL_TABLET | Freq: Every day | ORAL | 0 refills | Status: DC

## 2015-11-28 NOTE — Telephone Encounter (Signed)
[facility] is out of atenolol 50 mg. Pt would llike new rx atenolol 50 mg#90 w/refills   [facility] [address] [facility] gave atenolol

## 2015-11-28 NOTE — Telephone Encounter (Signed)
[facility] is unable to fill medication due to not having the medication in stock.  [facility] and [facility] will [facility] 1 refill to [facility] so it is available when they have back in stock.

## 2015-12-09 ENCOUNTER — Other Ambulatory Visit: Payer: Self-pay | Admitting: Physician Assistant

## 2015-12-09 ENCOUNTER — Other Ambulatory Visit: Payer: Self-pay

## 2015-12-09 ENCOUNTER — Telehealth: Payer: Self-pay | Admitting: Physician Assistant

## 2015-12-09 ENCOUNTER — Ambulatory Visit (INDEPENDENT_AMBULATORY_CARE_PROVIDER_SITE_OTHER): Payer: Commercial Managed Care - HMO

## 2015-12-09 ENCOUNTER — Ambulatory Visit (HOSPITAL_COMMUNITY): Payer: Commercial Managed Care - HMO | Attending: Cardiovascular Disease

## 2015-12-09 DIAGNOSIS — I509 Heart failure, unspecified: Secondary | ICD-10-CM | POA: Diagnosis not present

## 2015-12-09 DIAGNOSIS — R42 Dizziness and giddiness: Secondary | ICD-10-CM

## 2015-12-09 DIAGNOSIS — I480 Paroxysmal atrial fibrillation: Secondary | ICD-10-CM

## 2015-12-09 DIAGNOSIS — I083 Combined rheumatic disorders of mitral, aortic and tricuspid valves: Secondary | ICD-10-CM | POA: Diagnosis not present

## 2015-12-09 MED ORDER — APIXABAN 5 MG PO TABS: 5.0000 mg | ORAL_TABLET | Freq: Two times a day (BID) | ORAL | 3 refills | Status: DC

## 2015-12-09 MED ORDER — APIXABAN 5 MG PO TABS: 5.0000 mg | ORAL_TABLET | Freq: Two times a day (BID) | ORAL | 0 refills | Status: DC

## 2015-12-09 NOTE — Progress Notes (Deleted)
We do not currently have any samples of Eliquis. I sent in 30 day Rx of Eliquis 5 mg bid [facility] in [place]. And a 90 day Rx to [facility].

## 2015-12-09 NOTE — Progress Notes (Deleted)
We do not currently have samples of Eliquis 5 mg. [facility] [address] and a 90 day Rx to [facility].

## 2015-12-09 NOTE — Telephone Encounter (Signed)
Informed patient's daughter regarding result of echocardiogram. Also confirmed with her that the patient is indeed on eliquis, however they have not received any new prescription from [facility]. She has 2 pills left, will check with our staff to see if the previous prescription failed transplant to [place]. During the mean time, we will see if we have enough samples to bridge her.  Hilbert Corrigan PA Pager: [phone]

## 2015-12-10 ENCOUNTER — Telehealth: Payer: Self-pay | Admitting: Physician Assistant

## 2015-12-10 NOTE — Telephone Encounter (Signed)
Informed Pt daughter of Eliquis Rx sent in to pharmacies.

## 2015-12-25 ENCOUNTER — Telehealth: Payer: Self-pay | Admitting: Cardiology

## 2015-12-25 NOTE — Telephone Encounter (Signed)
Left message for pt to call.

## 2015-12-25 NOTE — Telephone Encounter (Signed)
Pt monitor stopped working after 2 weeks,pt refuses to wear it again. She just wanted you to be aware of this,when results come back it will be only based on those 2 weeks. Please call,she wants to talk to you about the Eliquis.

## 2015-12-26 NOTE — Progress Notes (Deleted)
HPI: Follow-up atrial fibrillation. Seen previously in the emergency room with dizziness and found to be in atrial fibrillation. She converted spontaneously. Echocardiogram October 2017 showed normal LV function, left ventricular hypertrophy, grade 1 diastolic dysfunction, moderate aortic insufficiency, mild mitral regurgitation and tricuspid regurgitation. Monitor October 2017 showed . Patient started on apixaban. Since last seen,   Current Outpatient Prescriptions  Medication Sig Dispense Refill  . acetaminophen (TYLENOL) 500 MG tablet Take 500 mg by mouth every 6 (six) hours as needed for moderate pain. Reported on 06/10/2015    . apixaban (ELIQUIS) 5 MG TABS tablet Take 1 tablet (5 mg total) by mouth 2 (two) times daily. 60 tablet 0  . apixaban (ELIQUIS) 5 MG TABS tablet Take 1 tablet (5 mg total) by mouth 2 (two) times daily. 180 tablet 3  . atenolol (TENORMIN) 50 MG tablet Take 1.5 tablets (75 mg total) by mouth daily. 135 tablet 0  . CALCIUM PO Take 1 tablet by mouth daily.    . Coenzyme Q10 (CO Q 10 PO) Take 1 tablet by mouth daily.     Mariane Baumgarten Calcium (STOOL SOFTENER PO) Take 1 tablet by mouth at bedtime.    [name] lisinopril (PRINIVIL,ZESTRIL) 40 MG tablet TAKE 1 TABLET EVERY DAY 90 tablet 1  . meclizine (ANTIVERT) 25 MG tablet Take 1 tablet (25 mg total) by mouth every 6 (six) hours as needed for dizziness. 40 tablet 0  . vitamin C (ASCORBIC ACID) 500 MG tablet Take 500 mg by mouth daily.      No current facility-administered medications for this visit.      Past Medical History:  Diagnosis Date  . Arthritis   . Breast cancer ([place])    mastectomy 3   neg ln   . HOH (hard of hearing)   . HTN (hypertension)   . Wears glasses   . Wears hearing aid    both ears    Past Surgical History:  Procedure Laterality Date  . APPENDECTOMY  2006  . BREAST BIOPSY  2008   left mast  . COLONOSCOPY    . HERNIA REPAIR    . INSERTION OF MESH N/A 09/05/2013   Procedure: INSERTION OF  MESH;  Surgeon: Joyice Faster. Cornett, MD;  Location: [facility];  Service: General;  Laterality: N/A;  . TONSILLECTOMY AND ADENOIDECTOMY     Childhood  . UMBILICAL HERNIA REPAIR N/A 09/05/2013   Procedure: HERNIA REPAIR UMBILICAL ADULT WITH MESH;  Surgeon: Joyice Faster. Cornett, MD;  Location: [facility];  Service: General;  Laterality: N/A;    Social History   Social History  . Marital status: Married    Spouse name: N/A  . Number of children: N/A  . Years of education: N/A   Occupational History  . Not on file.   Social History Main Topics  . Smoking status: Never Smoker  . Smokeless tobacco: Never Used  . Alcohol use 0.0 oz/week     Comment: occ  . Drug use: No  . Sexual activity: Not on file   Other Topics Concern  . Not on file   Social History Narrative   Usually receives 5 hours of sleep per night   3 people living in the home   Puppy  [place] and [place]   From [place] moved from [place]  Adopted daughter nearby.      Neg ets  Wine with meals  No tob rd.   G0P0   Married  husband [place] background  [place] descent.             Family History  Problem Relation Age of Onset  . Hypertension Mother   . Stroke Mother   . Heart failure Mother   . Cancer Sister     lung   . Colon cancer Father     ROS: no fevers or chills, productive cough, hemoptysis, dysphasia, odynophagia, melena, hematochezia, dysuria, hematuria, rash, seizure activity, orthopnea, PND, pedal edema, claudication. Remaining systems are negative.  Physical Exam: Well-developed well-nourished in no acute distress.  Skin is warm and dry.  HEENT is normal.  Neck is supple.  Chest is clear to auscultation with normal expansion.  Cardiovascular exam is regular rate and rhythm.  Abdominal exam nontender or distended. No masses palpated. Extremities show no edema. neuro grossly intact  ECG

## 2016-01-01 NOTE — Telephone Encounter (Signed)
Left message for barbara to call  

## 2016-01-06 ENCOUNTER — Ambulatory Visit: Payer: Commercial Managed Care - HMO | Admitting: Cardiology

## 2016-01-06 ENCOUNTER — Other Ambulatory Visit: Payer: Self-pay | Admitting: Internal Medicine

## 2016-01-06 DIAGNOSIS — Z1231 Encounter for screening mammogram for malignant neoplasm of breast: Secondary | ICD-10-CM

## 2016-01-08 ENCOUNTER — Telehealth: Payer: Self-pay | Admitting: Internal Medicine

## 2016-01-08 DIAGNOSIS — H9209 Otalgia, unspecified ear: Secondary | ICD-10-CM

## 2016-01-08 NOTE — Telephone Encounter (Signed)
Ok to refer to ent if needed

## 2016-01-08 NOTE — Telephone Encounter (Signed)
Pt daughter is requesting a referral to dr Ernesto Rutherford ent for ear pain and ear clogged. Pt has [name]

## 2016-01-08 NOTE — Telephone Encounter (Signed)
Referral placed in the system. 

## 2016-01-09 ENCOUNTER — Telehealth: Payer: Self-pay | Admitting: Internal Medicine

## 2016-01-09 NOTE — Telephone Encounter (Signed)
° °  Pt request refill of the following:  atenolol (TENORMIN) 50 MG tablet  lisinopril (PRINIVIL,ZESTRIL) 40 MG tablet  90 day supply   Phamacy:  [facility] mail order

## 2016-01-10 NOTE — Telephone Encounter (Signed)
Both medications were refilled 11/2015. [facility] and Lisinopril was sent to requested pharmacy, [facility]. Misty, CMA will attempt to contact patient on Monday for clarification on refill

## 2016-01-13 DIAGNOSIS — H6121 Impacted cerumen, right ear: Secondary | ICD-10-CM | POA: Diagnosis not present

## 2016-01-13 DIAGNOSIS — H9041 Sensorineural hearing loss, unilateral, right ear, with unrestricted hearing on the contralateral side: Secondary | ICD-10-CM | POA: Diagnosis not present

## 2016-01-13 DIAGNOSIS — R42 Dizziness and giddiness: Secondary | ICD-10-CM | POA: Diagnosis not present

## 2016-01-13 MED ORDER — LISINOPRIL 40 MG PO TABS: 40.0000 mg | ORAL_TABLET | Freq: Every day | ORAL | 0 refills | Status: DC

## 2016-01-13 MED ORDER — ATENOLOL 50 MG PO TABS: 75.0000 mg | ORAL_TABLET | Freq: Every day | ORAL | 0 refills | Status: DC

## 2016-01-13 NOTE — Telephone Encounter (Signed)
Medication sent to the pharmacy by e-scribe for 90 days.  Pt is now past due for yearly visit. Please help the pt to make appt(s).  Please see if she will schedule wellness with Wynetta Fines and cpx with Dr. Mamie Nick.  Thanks!!

## 2016-01-13 NOTE — Addendum Note (Signed)
Addended by: Miles Costain T on: 01/13/2016 12:17 PM   Modules accepted: Orders

## 2016-02-13 ENCOUNTER — Ambulatory Visit
Admission: RE | Admit: 2016-02-13 | Discharge: 2016-02-13 | Disposition: A | Discharge status: Home / Self Care | Payer: Commercial Managed Care - HMO | Source: Ambulatory Visit | Attending: Internal Medicine | Admitting: Internal Medicine

## 2016-02-13 DIAGNOSIS — Z1231 Encounter for screening mammogram for malignant neoplasm of breast: Secondary | ICD-10-CM

## 2016-03-16 ENCOUNTER — Other Ambulatory Visit: Payer: Self-pay | Admitting: Internal Medicine

## 2016-03-17 NOTE — Telephone Encounter (Signed)
Ok to refill x 1   Ov  Before further refills

## 2016-03-20 ENCOUNTER — Telehealth: Payer: Self-pay | Admitting: Family Medicine

## 2016-03-20 NOTE — Telephone Encounter (Signed)
Pt needs follow up within 3 months per [facility].  Please call daughter to schedule.  Thanks!!

## 2016-03-20 NOTE — Telephone Encounter (Signed)
Sent to the pharmacy by e-scribe.  Message sent to scheduling. 

## 2016-03-23 NOTE — Telephone Encounter (Signed)
Pt daughter Pamala Hurry will callback to sch

## 2016-04-06 NOTE — Progress Notes (Deleted)
No chief complaint on file.   HPI: Julie Boone 83 y.o. come in for Chronic disease management  Last visit  Was 4 53  She has hx of PAF  Breast  Cancer  Now on anticoagulation  On antihypertension meds  ROS: See pertinent positives and negatives per HPI.  Past Medical History:  Diagnosis Date  . Arthritis   . Breast cancer ([place])    mastectomy 3   neg ln   . HOH (hard of hearing)   . HTN (hypertension)   . Wears glasses   . Wears hearing aid    both ears    Family History  Problem Relation Age of Onset  . Hypertension Mother   . Stroke Mother   . Heart failure Mother   . Cancer Sister     lung   . Colon cancer Father     Social History   Social History  . Marital status: Married    Spouse name: N/A  . Number of children: N/A  . Years of education: N/A   Social History Main Topics  . Smoking status: Never Smoker  . Smokeless tobacco: Never Used  . Alcohol use 0.0 oz/week     Comment: occ  . Drug use: No  . Sexual activity: Not on file   Other Topics Concern  . Not on file   Social History Narrative   Usually receives 5 hours of sleep per night   3 people living in the home   [place]   From [place] moved from [place]  Adopted daughter nearby.      Neg ets  Wine with meals  No tob rd.   G0P0   Married husband [place] background  [place]elf [place] descent.             Outpatient Medications Prior to Visit  Medication Sig Dispense Refill  . acetaminophen (TYLENOL) 500 MG tablet Take 500 mg by mouth every 6 (six) hours as needed for moderate pain. Reported on 06/10/2015    . apixaban (ELIQUIS) 5 MG TABS tablet Take 1 tablet (5 mg total) by mouth 2 (two) times daily. 60 tablet 0  . apixaban (ELIQUIS) 5 MG TABS tablet Take 1 tablet (5 mg total) by mouth 2 (two) times daily. 180 tablet 3  . atenolol (TENORMIN) 50 MG tablet Take 1.5 tablets (75 mg total) by mouth daily. 135 tablet 0  . atenolol (TENORMIN) 50 MG tablet TAKE 1.5  TABLETS (75 MG TOTAL) BY MOUTH DAILY. 135 tablet 0  . CALCIUM PO Take 1 tablet by mouth daily.    . Coenzyme Q10 (CO Q 10 PO) Take 1 tablet by mouth daily.     Mariane Baumgarten Calcium (STOOL SOFTENER PO) Take 1 tablet by mouth at bedtime.    [name] lisinopril (PRINIVIL,ZESTRIL) 40 MG tablet Take 1 tablet (40 mg total) by mouth daily. 90 tablet 0  . meclizine (ANTIVERT) 25 MG tablet Take 1 tablet (25 mg total) by mouth every 6 (six) hours as needed for dizziness. 40 tablet 0  . vitamin C (ASCORBIC ACID) 500 MG tablet Take 500 mg by mouth daily.      No facility-administered medications prior to visit.      EXAM:  There were no vitals taken for this visit.  There is no height or weight on file to calculate BMI.  GENERAL: vitals reviewed and listed above, alert, oriented, appears well hydrated and in no acute distress HEENT: atraumatic, conjunctiva  clear, no obvious abnormalities on inspection of external nose and ears OP : no lesion edema or exudate  NECK: no obvious masses on inspection palpation  LUNGS: clear to auscultation bilaterally, no wheezes, rales or rhonchi, good air movement CV: HRRR, no clubbing cyanosis or  peripheral edema nl cap refill  MS: moves all extremities without noticeable focal  abnormality PSYCH: pleasant and cooperative, no obvious depression or anxiety Lab Results  Component Value Date   WBC 10.5 11/18/2015   HGB 14.7 11/18/2015   HCT 43.7 11/18/2015   PLT 222 11/18/2015   GLUCOSE 133 (H) 11/18/2015   CHOL 204 (H) 10/31/2014   TRIG 269.0 (H) 10/31/2014   HDL 32.60 (L) 10/31/2014   LDLDIRECT 110.0 10/31/2014   ALT 20 06/10/2015   AST 19 06/10/2015   NA 141 11/18/2015   K 3.9 11/18/2015   CL 110 11/18/2015   CREATININE 0.98 11/18/2015   BUN 19 11/18/2015   CO2 24 11/18/2015   TSH 1.87 06/10/2015   HGBA1C 6.0 04/07/2013   BP Readings from Last 3 Encounters:  11/25/15 (!) 156/88  11/18/15 148/71  06/10/15 (!) 160/84    ASSESSMENT AND  PLAN:  Discussed the following assessment and plan:  No diagnosis found.  -Patient advised to return or notify health care team  if  new concerns arise.  There are no Patient Instructions on file for this visit.   Standley Brooking. Cayde Held M.D.

## 2016-04-08 ENCOUNTER — Ambulatory Visit: Payer: Commercial Managed Care - HMO | Admitting: Internal Medicine

## 2016-04-21 ENCOUNTER — Ambulatory Visit: Payer: Commercial Managed Care - HMO | Admitting: Internal Medicine

## 2016-04-21 NOTE — Progress Notes (Signed)
Pre visit review using our clinic review tool, if applicable. No additional management support is needed unless otherwise documented below in the visit note.  Chief Complaint  Patient presents with  . Follow-up    HPI: Julie Boone 83 y.o. come in for Chronic disease management She is here with her daughter today 2. Since last visit diagnosed with paroxysmal atrial fibrillation in ed After her husband passed away and she was in acute distress. She had some syncope and noticed to have paroxysmal atrial fibrillation. She did follow up with cardiology who have given her [place]'s and went over her stroke risk. She decided she didn't really need to take those medicines. She is taking atenolol and lisinopril but only one a day of the atenolol did not think she needed to the dose.. Last office visit with me was April 2017. No bruising or bleeding Difficulty with hearing doesn't have a hearing aids and now once the ears checked to make sure there is no significant wax. She feels like she is grieving normally doing a lot better her daughter retired from her regular job and help take care of her husband when he was gravely ill. Her daughter and her now lives together and she is working from home going to go to [employer]. Feels more positive as possible. Right knee is problematic no change Denies GI symptoms at this time or bleeding. No falling gets regular mammograms. ROS: See pertinent positives and negatives per HPI.  Past Medical History:  Diagnosis Date  . Arthritis   . Breast cancer ([place])    mastectomy 3   neg ln   . HOH (hard of hearing)   . HTN (hypertension)   . Wears glasses   . Wears hearing aid    both ears    Family History  Problem Relation Age of Onset  . Hypertension Mother   . Stroke Mother   . Heart failure Mother   . Cancer Sister     lung   . Colon cancer Father     Social History   Social History  . Marital status: Married    Spouse name: N/A   . Number of children: N/A  . Years of education: N/A   Social History Main Topics  . Smoking status: Never Smoker  . Smokeless tobacco: Never Used  . Alcohol use 0.0 oz/week     Comment: occ  . Drug use: No  . Sexual activity: Not Asked   Other Topics Concern  . None   Social History Narrative   Usually receives 5 hours of sleep per night   2people living in the home   [place]   From [place] moved from [place]  Adopted daughter nearby.      Neg ets  Wine with meals  No tob rd.   G0P0   Married husband [place] background  [place] descent.   Husband passed CHF and prostate cancer 2017   Now living with daughter             Outpatient Medications Prior to Visit  Medication Sig Dispense Refill  . atenolol (TENORMIN) 50 MG tablet TAKE 1.5 TABLETS (75 MG TOTAL) BY MOUTH DAILY. (Patient taking differently: Take 50 mg by mouth daily. ) 135 tablet 0  . CALCIUM PO Take 1 tablet by mouth daily.    . Coenzyme Q10 (CO Q 10 PO) Take 1 tablet by mouth daily.     . Docusate Calcium (STOOL  SOFTENER PO) Take 1 tablet by mouth at bedtime.    [name] lisinopril (PRINIVIL,ZESTRIL) 40 MG tablet Take 1 tablet (40 mg total) by mouth daily. 90 tablet 0  . meclizine (ANTIVERT) 25 MG tablet Take 1 tablet (25 mg total) by mouth every 6 (six) hours as needed for dizziness. 40 tablet 0  . vitamin C (ASCORBIC ACID) 500 MG tablet Take 500 mg by mouth daily.     [name] acetaminophen (TYLENOL) 500 MG tablet Take 500 mg by mouth every 6 (six) hours as needed for moderate pain. Reported on 06/10/2015    . apixaban (ELIQUIS) 5 MG TABS tablet Take 1 tablet (5 mg total) by mouth 2 (two) times daily. 60 tablet 0  . apixaban (ELIQUIS) 5 MG TABS tablet Take 1 tablet (5 mg total) by mouth 2 (two) times daily. 180 tablet 3  . atenolol (TENORMIN) 50 MG tablet Take 1.5 tablets (75 mg total) by mouth daily. 135 tablet 0   No facility-administered medications prior to visit.      EXAM:  BP (!)  152/76 (BP Location: Right Arm, Cuff Size: Normal)   Temp 98.2 F (36.8 C) (Oral)   Ht 5\' 5"  (1.651 m)   Wt 173 lb (78.5 kg)   BMI 28.79 kg/m   Body mass index is 28.79 kg/m.  GENERAL: vitals reviewed and listed above, alert, oriented, appears well hydrated and in no acute distressShe is obviously hard of hearing. Younger than her stated age is ambulatory. HEENT: atraumatic, conjunctiva  clear, no obvious abnormalities on inspection of external nose and ears TMs intact minimal wax left ear none on the right ear. OP : no lesion edema or exudate  NECK: no obvious masses on inspection palpation  LUNGS: clear to auscultation bilaterally, no wheezes, rales or rhonchi,  CV: HRRR, no clubbing cyanosis or  peripheral edema nl cap refill some superficial minor venous varicosities of ankles feet no ulcers. MS: moves all extremities without noticeable focal  abnormality except for right knee which is problematic PSYCH: pleasant and cooperative, no obvious depression or anxiety Lab Results  Component Value Date   WBC 10.5 11/18/2015   HGB 14.7 11/18/2015   HCT 43.7 11/18/2015   PLT 222 11/18/2015   GLUCOSE 133 (H) 11/18/2015   CHOL 204 (H) 10/31/2014   TRIG 269.0 (H) 10/31/2014   HDL 32.60 (L) 10/31/2014   LDLDIRECT 110.0 10/31/2014   ALT 20 06/10/2015   AST 19 06/10/2015   NA 141 11/18/2015   K 3.9 11/18/2015   CL 110 11/18/2015   CREATININE 0.98 11/18/2015   BUN 19 11/18/2015   CO2 24 11/18/2015   TSH 1.87 06/10/2015   HGBA1C 6.0 04/07/2013   BP Readings from Last 3 Encounters:  04/22/16 (!) 152/76  11/25/15 (!) 156/88  11/18/15 148/71   Wt Readings from Last 3 Encounters:  04/22/16 173 lb (78.5 kg)  11/25/15 170 lb 12.8 oz (77.5 kg)  06/10/15 170 lb (77.1 kg)    ASSESSMENT AND PLAN:  Discussed the following assessment and plan:  Essential hypertension  Medication management  Uses hearing aid  Advanced age  Osteoarthritis of knee, unspecified laterality,  unspecified osteoarthritis type  Bereavement She was anxious today because she had been to the office since her husband passed away. Daughter will check her readings at home to ensure adequate control. Reviewed anticoagulation and prior excisional atrial fib. She apparently had a monitor which showed no arrhythmia and the family attributes her episode to severe stress. She is aware  and does want to take the risk of being on anticoagulation at this time. No change in medication are OV in 6 months can do lab work at that time when due. Total visit 59mins > 50% spent counseling and coordinating care as indicated in above note and in instructions to patient .   -Patient advised to return or notify health care team  if  new concerns arise.  Patient Instructions  Glad you are doin better    Grief is hard work.   Make sure BP readings  Below 150  Prefer below  140   Plan ROV in 6 months or as needed .     Standley Brooking. Amali Uhls M.D.

## 2016-04-22 ENCOUNTER — Encounter: Payer: Self-pay | Admitting: Internal Medicine

## 2016-04-22 ENCOUNTER — Ambulatory Visit (INDEPENDENT_AMBULATORY_CARE_PROVIDER_SITE_OTHER): Payer: Medicare HMO | Admitting: Internal Medicine

## 2016-04-22 VITALS — BP 152/76 | Temp 98.2°F | Ht 65.0 in | Wt 173.0 lb

## 2016-04-22 DIAGNOSIS — M179 Osteoarthritis of knee, unspecified: Secondary | ICD-10-CM

## 2016-04-22 DIAGNOSIS — Z974 Presence of external hearing-aid: Secondary | ICD-10-CM

## 2016-04-22 DIAGNOSIS — M1710 Unilateral primary osteoarthritis, unspecified knee: Secondary | ICD-10-CM

## 2016-04-22 DIAGNOSIS — R54 Age-related physical debility: Secondary | ICD-10-CM | POA: Diagnosis not present

## 2016-04-22 DIAGNOSIS — Z79899 Other long term (current) drug therapy: Secondary | ICD-10-CM

## 2016-04-22 DIAGNOSIS — Z634 Disappearance and death of family member: Secondary | ICD-10-CM

## 2016-04-22 DIAGNOSIS — I10 Essential (primary) hypertension: Secondary | ICD-10-CM

## 2016-04-22 NOTE — Patient Instructions (Signed)
Glad you are doin better    Grief is hard work.   Make sure BP readings  Below 150  Prefer below  140   Plan ROV in 6 months or as needed .

## 2016-06-08 DIAGNOSIS — M170 Bilateral primary osteoarthritis of knee: Secondary | ICD-10-CM | POA: Diagnosis not present

## 2016-06-08 DIAGNOSIS — M25562 Pain in left knee: Secondary | ICD-10-CM | POA: Diagnosis not present

## 2016-06-08 DIAGNOSIS — M25561 Pain in right knee: Secondary | ICD-10-CM | POA: Diagnosis not present

## 2016-06-16 DIAGNOSIS — M170 Bilateral primary osteoarthritis of knee: Secondary | ICD-10-CM | POA: Diagnosis not present

## 2016-06-16 DIAGNOSIS — M25561 Pain in right knee: Secondary | ICD-10-CM | POA: Diagnosis not present

## 2016-06-16 DIAGNOSIS — M25562 Pain in left knee: Secondary | ICD-10-CM | POA: Diagnosis not present

## 2016-06-30 DIAGNOSIS — M170 Bilateral primary osteoarthritis of knee: Secondary | ICD-10-CM | POA: Diagnosis not present

## 2016-06-30 DIAGNOSIS — M25561 Pain in right knee: Secondary | ICD-10-CM | POA: Diagnosis not present

## 2016-06-30 DIAGNOSIS — M25562 Pain in left knee: Secondary | ICD-10-CM | POA: Diagnosis not present

## 2016-07-02 ENCOUNTER — Other Ambulatory Visit: Payer: Self-pay | Admitting: Internal Medicine

## 2016-07-07 DIAGNOSIS — M25562 Pain in left knee: Secondary | ICD-10-CM | POA: Diagnosis not present

## 2016-07-07 DIAGNOSIS — M25561 Pain in right knee: Secondary | ICD-10-CM | POA: Diagnosis not present

## 2016-07-07 DIAGNOSIS — M170 Bilateral primary osteoarthritis of knee: Secondary | ICD-10-CM | POA: Diagnosis not present

## 2016-07-14 DIAGNOSIS — M25561 Pain in right knee: Secondary | ICD-10-CM | POA: Diagnosis not present

## 2016-07-14 DIAGNOSIS — M170 Bilateral primary osteoarthritis of knee: Secondary | ICD-10-CM | POA: Diagnosis not present

## 2016-07-14 DIAGNOSIS — M25562 Pain in left knee: Secondary | ICD-10-CM | POA: Diagnosis not present

## 2016-07-15 ENCOUNTER — Other Ambulatory Visit: Payer: Self-pay | Admitting: Emergency Medicine

## 2016-07-15 ENCOUNTER — Telehealth: Payer: Self-pay | Admitting: Internal Medicine

## 2016-07-15 DIAGNOSIS — M25569 Pain in unspecified knee: Secondary | ICD-10-CM

## 2016-07-15 DIAGNOSIS — M1990 Unspecified osteoarthritis, unspecified site: Secondary | ICD-10-CM

## 2016-07-15 NOTE — Telephone Encounter (Signed)
Ok to do this   Dx knee pain arthritis   But  May need office visit  If any  Barriers   Or have knee specialist involved

## 2016-07-15 NOTE — Telephone Encounter (Signed)
° ° ° °  Pt daughter Pamala Hurry call to ask if Dr Regis Bill will give her a script for a wheelchair. Pt is having issues with distance walking and has knee pain

## 2016-07-15 NOTE — Telephone Encounter (Signed)
Please advise 

## 2016-07-16 ENCOUNTER — Other Ambulatory Visit: Payer: Self-pay | Admitting: Emergency Medicine

## 2016-07-16 ENCOUNTER — Telehealth: Payer: Self-pay | Admitting: Internal Medicine

## 2016-07-16 NOTE — Telephone Encounter (Signed)
° ° ° °  Corene Cornea with [facility] call to say pt insurance is requiring some additional information and would like a call back    [phone]  Ext [phone]

## 2016-07-16 NOTE — Telephone Encounter (Signed)
Spoke with Corene Cornea from [facility] and he states that pt needs an OV to elaborate on why pt needs a wheel chair.

## 2016-07-17 NOTE — Telephone Encounter (Signed)
Spoke with pt daughter okay per DPR and explained to her that we need to schedule an OV. Marland Mcalpine (daughter) states she will call Monday to schedule an appointment

## 2016-07-17 NOTE — Telephone Encounter (Signed)
Spoke with pt daughter and she states that her mom has a hairline fracture, severe arthritis, becomes really fatigue after walking 10 minutes. Going to call back and schedule an appointment.

## 2016-09-11 ENCOUNTER — Ambulatory Visit (INDEPENDENT_AMBULATORY_CARE_PROVIDER_SITE_OTHER): Payer: Medicare HMO | Admitting: Internal Medicine

## 2016-09-11 ENCOUNTER — Encounter: Payer: Self-pay | Admitting: Internal Medicine

## 2016-09-11 VITALS — BP 150/80 | HR 70 | Temp 98.2°F | Wt 177.0 lb

## 2016-09-11 DIAGNOSIS — H6120 Impacted cerumen, unspecified ear: Secondary | ICD-10-CM | POA: Diagnosis not present

## 2016-09-11 DIAGNOSIS — Z87898 Personal history of other specified conditions: Secondary | ICD-10-CM | POA: Diagnosis not present

## 2016-09-11 DIAGNOSIS — R54 Age-related physical debility: Secondary | ICD-10-CM

## 2016-09-11 DIAGNOSIS — Z974 Presence of external hearing-aid: Secondary | ICD-10-CM | POA: Diagnosis not present

## 2016-09-11 DIAGNOSIS — H9209 Otalgia, unspecified ear: Secondary | ICD-10-CM

## 2016-09-11 NOTE — Progress Notes (Signed)
Chief Complaint  Patient presents with  . Otalgia    both ears    HPI: Julie Boone 83 y.o.  sda  Here with daughter today. She has hearing aids and has a history of intermittent vertigo. She was told she has wax in her left ear. They always feel clot that she has ear aches and her ears are very sensitive there is no fever or cough. The last time she had any irritation she had step in the emergency room with vomiting and dizziness. She takes occasional Antivert. Her ear nose and throat doctor is no longer in her network Dr. Lucia Gaskins. She's had wax removed in various different ways for her sensitive ears. Also she has seen her dentist and in the spring had a Panorex type x-ray and her tooth pain was felt to be from a sinus infection and her symptoms got all better with an antibiotic. She's supposed to be seeing a root canal specialist soon. She is very sensitive when she lays down she gets vertigo so she sleeps with pillows. She has never had official physical therapy for vertigo but is done some exercises.  ROS: See pertinent positives and negatives per HPI.  Past Medical History:  Diagnosis Date  . Acute sinusitis with symptoms greater than 10 days 06/23/2013  . Arthritis   . Breast cancer ([place])    mastectomy 3   neg ln   . HOH (hard of hearing)   . HTN (hypertension)   . Wears glasses   . Wears hearing aid    both ears    Family History  Problem Relation Age of Onset  . Hypertension Mother   . Stroke Mother   . Heart failure Mother   . Cancer Sister        lung   . Colon cancer Father     Social History   Social History  . Marital status: Married    Spouse name: N/A  . Number of children: N/A  . Years of education: N/A   Social History Main Topics  . Smoking status: Never Smoker  . Smokeless tobacco: Never Used  . Alcohol use 0.0 oz/week     Comment: occ  . Drug use: No  . Sexual activity: Not Asked   Other Topics Concern  . None   Social History  Narrative   Usually receives 5 hours of sleep per night   2people living in the home   [place]   From [place] moved from [place]  Adopted daughter nearby.      Neg ets  Wine with meals  No tob rd.   G0P0   Married husband [place] background  [place] descent.   Husband passed CHF and prostate cancer 2017   Now living with daughter             Outpatient Medications Prior to Visit  Medication Sig Dispense Refill  . atenolol (TENORMIN) 50 MG tablet TAKE 1.5 TABLETS (75 MG TOTAL) BY MOUTH DAILY. 135 tablet 0  . CALCIUM PO Take 1 tablet by mouth daily.    [name] lisinopril (PRINIVIL,ZESTRIL) 40 MG tablet Take 1 tablet (40 mg total) by mouth daily. 90 tablet 0  . meclizine (ANTIVERT) 25 MG tablet Take 1 tablet (25 mg total) by mouth every 6 (six) hours as needed for dizziness. 40 tablet 0  . vitamin C (ASCORBIC ACID) 500 MG tablet Take 500 mg by mouth daily.     [name]  Coenzyme Q10 (CO Q 10 PO) Take 1 tablet by mouth daily.     Mariane Baumgarten Calcium (STOOL SOFTENER PO) Take 1 tablet by mouth at bedtime.     No facility-administered medications prior to visit.      EXAM:  BP (!) 150/80 (BP Location: Left Arm, Patient Position: Sitting, Cuff Size: Normal)   Pulse 70   Temp 98.2 F (36.8 C) (Oral)   Wt 177 lb (80.3 kg)   BMI 29.45 kg/m   Body mass index is 29.45 kg/m.  GENERAL: vitals reviewed and listed above, alert, oriented, appears well hydrated and in no acute distressShe is pleasant cognitively intact ambulatory but hard of hearing. HEENT: atraumatic, conjunctiva  clear, no obvious abnormalities on inspection of external nose and ears right TM appears gray there is a slight amount of soft wax in the right EAC left EAC is a large amount of brown wax cannot see the TM area OP is clear no obvious lesions. Nares minimally congested face nontender. Neck supple without obvious masses. Neurologic no gross deformity or defects. Except for hard of hearing OP : no  lesion edema or exudate  CV rr no g or m   PSYCH: pleasant and cooperative, no obvious depression or anxiety  ASSESSMENT AND PLAN:  Discussed the following assessment and plan:  Earache - Plan: Ambulatory referral to ENT  Wax in ear - Plan: Ambulatory referral to ENT  Uses hearing aid - Plan: Ambulatory referral to ENT  Hx of vertigo - Plan: Ambulatory referral to ENT  Advanced age  Wears hearing aid She may have a combination reason for her symptoms. Her earache could be related to oral problems jaw sinuses unclear She definitely has left wax impaction but because of her past history of symptoms with removal and she may do best with suction removal. Plan ENT referral to one in network. If appropriate she could be referred for vestibular rehabilitation but would get ENT evaluation first cautioned use of Antivert as this is risk of falling and sedation. Daughter is aware.  Total visit 76mins > 50% spent counseling and coordinating care as indicated in above note and in instructions to patient .   -Patient advised to return or notify health care team  if symptoms worsen ,persist or new concerns arise.  Patient Instructions  Your symptoms could be from a number of problems. Begin generic Claritin plain once a day for at least a week Also begin Flonase or other nasal cortisone 2 sprays each side of the nose once a day This is good for chronic sinus problems. Her left ear has a lot of wax in the right not as much I agree with not doing irrigation based on your past history he can trigger vertigo. There is a possibility that some of your vertigo is positional and may get better with physical therapy but would want you to see ENT first . Will refer ent for in network provider .    Antivert has risk of sedation and falling also    Mariann Laster K. Tyonna Talerico M.D.

## 2016-09-11 NOTE — Patient Instructions (Signed)
Your symptoms could be from a number of problems. Begin generic Claritin plain once a day for at least a week Also begin Flonase or other nasal cortisone 2 sprays each side of the nose once a day This is good for chronic sinus problems. Her left ear has a lot of wax in the right not as much I agree with not doing irrigation based on your past history he can trigger vertigo. There is a possibility that some of your vertigo is positional and may get better with physical therapy but would want you to see ENT first . Will refer ent for in network provider .    Antivert has risk of sedation and falling also

## 2016-09-21 DIAGNOSIS — R42 Dizziness and giddiness: Secondary | ICD-10-CM | POA: Diagnosis not present

## 2016-09-21 DIAGNOSIS — H6121 Impacted cerumen, right ear: Secondary | ICD-10-CM | POA: Diagnosis not present

## 2016-10-08 ENCOUNTER — Emergency Department (HOSPITAL_COMMUNITY): Payer: Medicare HMO

## 2016-10-08 ENCOUNTER — Emergency Department (HOSPITAL_COMMUNITY)
Admission: EM | Admit: 2016-10-08 | Discharge: 2016-10-09 | Disposition: A | Discharge status: Home / Self Care | Payer: Medicare HMO | Attending: Emergency Medicine | Admitting: Emergency Medicine

## 2016-10-08 DIAGNOSIS — K5900 Constipation, unspecified: Secondary | ICD-10-CM | POA: Insufficient documentation

## 2016-10-08 DIAGNOSIS — R109 Unspecified abdominal pain: Secondary | ICD-10-CM

## 2016-10-08 DIAGNOSIS — R110 Nausea: Secondary | ICD-10-CM | POA: Diagnosis not present

## 2016-10-08 DIAGNOSIS — Z79899 Other long term (current) drug therapy: Secondary | ICD-10-CM | POA: Insufficient documentation

## 2016-10-08 DIAGNOSIS — R1030 Lower abdominal pain, unspecified: Secondary | ICD-10-CM | POA: Insufficient documentation

## 2016-10-08 DIAGNOSIS — I10 Essential (primary) hypertension: Secondary | ICD-10-CM | POA: Diagnosis not present

## 2016-10-08 DIAGNOSIS — R1084 Generalized abdominal pain: Secondary | ICD-10-CM | POA: Diagnosis not present

## 2016-10-08 DIAGNOSIS — K5909 Other constipation: Secondary | ICD-10-CM | POA: Diagnosis not present

## 2016-10-08 LAB — URINALYSIS, ROUTINE W REFLEX MICROSCOPIC
BILIRUBIN URINE: NEGATIVE
GLUCOSE, UA: NEGATIVE mg/dL
Hgb urine dipstick: NEGATIVE
KETONES UR: 5 mg/dL — AB
NITRITE: NEGATIVE
PH: 5 (ref 5.0–8.0)
Protein, ur: NEGATIVE mg/dL
Specific Gravity, Urine: 1.018 (ref 1.005–1.030)

## 2016-10-08 LAB — CBC
HCT: 42.4 % (ref 36.0–46.0)
HEMOGLOBIN: 14.6 g/dL (ref 12.0–15.0)
MCH: 32.2 pg (ref 26.0–34.0)
MCHC: 34.4 g/dL (ref 30.0–36.0)
MCV: 93.4 fL (ref 78.0–100.0)
Platelets: 260 10*3/uL (ref 150–400)
RBC: 4.54 MIL/uL (ref 3.87–5.11)
RDW: 12.3 % (ref 11.5–15.5)
WBC: 9.4 10*3/uL (ref 4.0–10.5)

## 2016-10-08 LAB — COMPREHENSIVE METABOLIC PANEL
ALK PHOS: 80 U/L (ref 38–126)
ALT: 34 U/L (ref 14–54)
ANION GAP: 10 (ref 5–15)
AST: 34 U/L (ref 15–41)
Albumin: 4.1 g/dL (ref 3.5–5.0)
BILIRUBIN TOTAL: 0.8 mg/dL (ref 0.3–1.2)
BUN: 20 mg/dL (ref 6–20)
CALCIUM: 9.7 mg/dL (ref 8.9–10.3)
CO2: 24 mmol/L (ref 22–32)
Chloride: 105 mmol/L (ref 101–111)
Creatinine, Ser: 0.94 mg/dL (ref 0.44–1.00)
GFR calc non Af Amer: 52 mL/min — ABNORMAL LOW (ref >60)
Glucose, Bld: 115 mg/dL — ABNORMAL HIGH (ref 65–99)
Potassium: 4.3 mmol/L (ref 3.5–5.1)
SODIUM: 139 mmol/L (ref 135–145)
TOTAL PROTEIN: 7 g/dL (ref 6.5–8.1)

## 2016-10-08 LAB — LIPASE, BLOOD: Lipase: 37 U/L (ref 11–51)

## 2016-10-08 NOTE — ED Triage Notes (Signed)
Pt states that she has had sharp abdominal pain when she bends over and cramping after she eats. Alert and oriented.

## 2016-10-08 NOTE — ED Provider Notes (Signed)
[facility] DEPT Provider Note   CSN: [id number] Arrival date & time: 10/08/16  1553     History   Chief Complaint Chief Complaint  Patient presents with  . Abdominal Pain    HPI PETREA FREDENBURG is a 83 y.o. female.   Abdominal Pain   This is a recurrent problem. Episode onset: several weeks. Episode frequency: intermittent. The problem has been resolved. The pain is associated with an unknown factor. The pain is located in the suprapubic region and periumbilical region. The pain is moderate. Associated symptoms include nausea and constipation (intermittent for several years). Pertinent negatives include fever, diarrhea, flatus and vomiting. The symptoms are aggravated by eating. Nothing relieves the symptoms. Past workup includes surgery (umbilical hernia repair 3 yrs ago).    Past Medical History:  Diagnosis Date  . Acute sinusitis with symptoms greater than 10 days 06/23/2013  . Arthritis   . Breast cancer ([place])    mastectomy 3   neg ln   . HOH (hard of hearing)   . HTN (hypertension)   . Wears glasses   . Wears hearing aid    both ears    Patient Active Problem List   Diagnosis Date Noted  . TMJ syndrome 01/22/2014  . Abnormal chest x-ray 09/22/2013  . Allergic rhinitis 06/23/2013  . Wax in ear 06/23/2013  . Abnormal thyroid blood test 04/14/2013  . Elevated blood sugar 04/14/2013  . Decreased hearing 04/14/2013  . Wears hearing aid 03/03/2013  . Unspecified essential hypertension 03/03/2013  . Leg cramps 03/03/2013  . Influenza vaccination declined by patient 03/03/2013  . Pneumococcal vaccination declined by patient 03/03/2013  . Breast cancer [facility])     Past Surgical History:  Procedure Laterality Date  . APPENDECTOMY  2006  . BREAST BIOPSY  2008   left mast  . COLONOSCOPY    . HERNIA REPAIR    . INSERTION OF MESH N/A 09/05/2013   Procedure: INSERTION OF MESH;  Surgeon: Joyice Faster. [place], MD;  Location: [facility];  Service:  General;  Laterality: N/A;  . TONSILLECTOMY AND ADENOIDECTOMY     Childhood  . UMBILICAL HERNIA REPAIR N/A 09/05/2013   Procedure: HERNIA REPAIR UMBILICAL ADULT WITH MESH;  Surgeon: Joyice Faster. [place], MD;  Location: [place];  Service: General;  Laterality: N/A;    OB History    Gravida Para Term Preterm AB Living   0 0 0 0 0 0   SAB TAB Ectopic Multiple Live Births   0 0 0 0         Home Medications    Prior to Admission medications   Medication Sig Start Date End Date Taking? Authorizing Provider  atenolol (TENORMIN) 50 MG tablet TAKE 1.5 TABLETS (75 MG TOTAL) BY MOUTH DAILY. 07/02/16  Yes Panosh, Standley Brooking, MD  calcium carbonate (OS-CAL - DOSED IN MG OF ELEMENTAL CALCIUM) 1250 (500 Ca) MG tablet Take 1 tablet by mouth daily with breakfast.   Yes [provider]  Coenzyme Q10 (CO Q 10 PO) Take 1 tablet by mouth daily.    Yes [provider]  lisinopril (PRINIVIL,ZESTRIL) 40 MG tablet Take 1 tablet (40 mg total) by mouth daily. 01/13/16  Yes Panosh, Standley Brooking, MD  meclizine (ANTIVERT) 25 MG tablet Take 1 tablet (25 mg total) by mouth every 6 (six) hours as needed for dizziness. 02/19/14  Yes Rolland Porter, MD  vitamin C (ASCORBIC ACID) 500 MG tablet Take 500 mg by mouth daily.  Yes [provider]  polyethylene glycol powder (MIRALAX) powder Start taking 1 capful a day. Slowly cut back as needed until you have normal bowel movements 10/09/16   Lainee Lehrman, Grayce Sessions, MD    Family History Family History  Problem Relation Age of Onset  . Hypertension Mother   . Stroke Mother   . Heart failure Mother   . Cancer Sister        lung   . Colon cancer Father     Social History Social History  Substance Use Topics  . Smoking status: Never Smoker  . Smokeless tobacco: Never Used  . Alcohol use 0.0 oz/week     Comment: occ     Allergies   Patient has no known allergies.   Review of Systems Review of Systems  Constitutional: Negative for  fever.  Gastrointestinal: Positive for abdominal pain, constipation (intermittent for several years) and nausea. Negative for diarrhea, flatus and vomiting.  All other systems are reviewed and are negative for acute change except as noted in the HPI    Physical Exam Updated Vital Signs BP (!) 182/79 (BP Location: Right Arm)   Pulse 70   Temp 98.1 F (36.7 C) (Oral)   Resp 18   SpO2 98%   Physical Exam  Constitutional: She is oriented to person, place, and time. She appears well-developed and well-nourished. No distress.  HENT:  Head: Normocephalic and atraumatic.  Nose: Nose normal.  Eyes: Pupils are equal, round, and reactive to light. Conjunctivae and EOM are normal. Right eye exhibits no discharge. Left eye exhibits no discharge. No scleral icterus.  Neck: Normal range of motion. Neck supple.  Cardiovascular: Normal rate and regular rhythm.  Exam reveals no gallop and no friction rub.   No murmur heard. Pulmonary/Chest: Effort normal and breath sounds normal. No stridor. No respiratory distress. She has no rales.  Abdominal: Soft. She exhibits no distension. There is tenderness (mild discomfort) in the periumbilical area. There is no rigidity, no rebound and no guarding.  Musculoskeletal: She exhibits no edema or tenderness.  Neurological: She is alert and oriented to person, place, and time.  Skin: Skin is warm and dry. No rash noted. She is not diaphoretic. No erythema.  Psychiatric: She has a normal mood and affect.  Vitals reviewed.    ED Treatments / Results  Labs (all labs ordered are listed, but only abnormal results are displayed) Labs Reviewed  COMPREHENSIVE METABOLIC PANEL - Abnormal; Notable for the following:       Result Value   Glucose, Bld 115 (*)    GFR calc non Af Amer 52 (*)    All other components within normal limits  URINALYSIS, ROUTINE W REFLEX MICROSCOPIC - Abnormal; Notable for the following:    APPearance HAZY (*)    Ketones, ur 5 (*)     Leukocytes, UA MODERATE (*)    Bacteria, UA RARE (*)    Squamous Epithelial / LPF 0-5 (*)    Non Squamous Epithelial 0-5 (*)    All other components within normal limits  LIPASE, BLOOD  CBC    EKG  EKG Interpretation None       Radiology Dg Abd 1 View  Result Date: 10/08/2016 CLINICAL DATA:  Abdominal pain. EXAM: ABDOMEN - 1 VIEW COMPARISON:  None. FINDINGS: Upright film shows is unremarkable bowel gas pattern with scattered stool throughout most of the colon. No evidence of free air. No abnormal calcifications identified. Degenerative disease of the lumbar spine present with associated  leftward convex scoliosis. IMPRESSION: No acute findings. Electronically Signed   By: Aletta Edouard M.D.   On: 10/08/2016 22:51    Procedures Procedures (including critical care time)  Medications Ordered in ED Medications - No data to display   Initial Impression / Assessment and Plan / ED Course  I have reviewed the triage vital signs and the nursing notes.  Pertinent labs & imaging results that were available during my care of the patient were reviewed by me and considered in my medical decision making (see chart for details).     Several weeks of abdominal cramping in the setting of likely constipation. Abdomen with mild discomfort but no evidence of peritonitis. Labs grossly reassuring without evidence of leukocytosis, electrolyte derangements, renal insufficiency. No evidence of biliary obstruction or pancreatitis.  Upright KUB not concerning for obstruction. Patient does have moderate amount of stool burden, consistent with constipation.   Low suspicion for serious intra-abdominal inflammatory/infectious process. Do not feel that advanced imaging is warranted at this time  The patient is safe for discharge with strict return precautions.   Final Clinical Impressions(s) / ED Diagnoses   Final diagnoses:  Abdominal pain  Abdominal cramping  Other constipation   Disposition:  Discharge  Condition: Good  I have discussed the results, Dx and Tx plan with the patient who expressed understanding and agree(s) with the plan. Discharge instructions discussed at great length. The patient was given strict return precautions who verbalized understanding of the instructions. No further questions at time of discharge.    New Prescriptions   POLYETHYLENE GLYCOL POWDER (MIRALAX) POWDER    Start taking 1 capful a day. Slowly cut back as needed until you have normal bowel movements    Follow Up: Panosh, Standley Brooking, MD [place] [id number] [phone]  Schedule an appointment as soon as possible for a visit        Caylynn Minchew, Grayce Sessions, MD 10/09/16 0005

## 2016-10-09 MED ORDER — POLYETHYLENE GLYCOL 3350 17 GM/SCOOP PO POWD: ORAL | 0 refills | Status: DC

## 2016-10-16 ENCOUNTER — Other Ambulatory Visit: Payer: Self-pay | Admitting: Emergency Medicine

## 2016-10-16 ENCOUNTER — Telehealth: Payer: Self-pay | Admitting: Internal Medicine

## 2016-10-16 MED ORDER — LISINOPRIL 40 MG PO TABS: 40.0000 mg | ORAL_TABLET | Freq: Every day | ORAL | 0 refills | Status: DC

## 2016-10-16 MED ORDER — ATENOLOL 50 MG PO TABS: 75.0000 mg | ORAL_TABLET | Freq: Every day | ORAL | 0 refills | Status: DC

## 2016-10-16 NOTE — Telephone Encounter (Signed)
Pt request refill  atenolol (TENORMIN) 50 MG tablet lisinopril (PRINIVIL,ZESTRIL) 40 MG tablet  [place]

## 2016-10-16 NOTE — Telephone Encounter (Signed)
Daughter Pamala Hurry called this am and cancelled pt and her appt for next week with Dr Regis Bill.  She said they had a family emergency and she will call back to reschedule.

## 2016-10-16 NOTE — Telephone Encounter (Signed)
Patient is due for a f/u visit for blood pressure. Sent in refill for blood pressure medication. Please help patient schedule an appointment.

## 2016-10-20 ENCOUNTER — Ambulatory Visit: Payer: Medicare HMO | Admitting: Internal Medicine

## 2016-11-20 ENCOUNTER — Encounter: Payer: Self-pay | Admitting: Internal Medicine

## 2016-12-01 NOTE — Progress Notes (Signed)
Chief Complaint  Patient presents with  . Acute Visit  . Heartburn    Pt is concerned about really bad acid reflux when she eats certain food   . Leg Problem    Pt here today c/o burning sensation in both legs, sometimes difficult to walk or stand for long period of times    HPI: Julie Boone 83 y.o. come in for couple of problems here with her daughter.   Heart burn  Most  Times  Months  More regular . Over the last week or so describes a burning in her mid chest without dysphasia  Trying to change foods.  oj worse.  tums  Trial.  Not much .   No nocturnal. No weight loss  And  No nocturnal    No weight loss no vomiting.  Has lots of belching.  And some right upper quadrant discomfort especially when she bends over.  She was seen in the emergency room in the last week or 2 for abdominal pain was felt to be constipated had an abdominal CT which was unrevealing for acute process.  Next problem is been going on more recently without falling complains of burning feeling in her feet and toes at night and then the sensation is occurring which makes it harder to stand for long periods of time or walking but no obvious weakness.  No history of diabetes but elevated blood sugar in the ED. States that the distal part of her today's and feet look blue with varicose veins but no acute swelling.  She does have a history of breast cancer. Mastectomy left   Please check ears has hearing aids make sure no wax.    ROS: See pertinent positives and negatives per HPI.  Past Medical History:  Diagnosis Date  . Acute sinusitis with symptoms greater than 10 days 06/23/2013  . Arthritis   . Breast cancer ([place])    mastectomy 3   neg ln   . HOH (hard of hearing)   . HTN (hypertension)   . Wears glasses   . Wears hearing aid    both ears    Family History  Problem Relation Age of Onset  . Hypertension Mother   . Stroke Mother   . Heart failure Mother   . Cancer Sister        lung    . Colon cancer Father     Social History   Social History  . Marital status: Married    Spouse name: N/A  . Number of children: N/A  . Years of education: N/A   Social History Main Topics  . Smoking status: Never Smoker  . Smokeless tobacco: Never Used  . Alcohol use 0.0 oz/week     Comment: occ  . Drug use: No  . Sexual activity: Not Asked   Other Topics Concern  . None   Social History Narrative   Usually receives 5 hours of sleep per night   2people living in the home   Puppy  [place] [place] and [place]   From [place] moved from [place]  Adopted daughter nearby.      Neg ets  Wine with meals  No tob rd.   G0P0   Married husband [place] background  H[place]elf [place] descent.   Husband passed CHF and prostate cancer 2017   Now living with daughter             Outpatient Medications Prior to Visit  Medication Sig Dispense Refill  .  atenolol (TENORMIN) 50 MG tablet Take 1.5 tablets (75 mg total) by mouth daily. 135 tablet 0  . lisinopril (PRINIVIL,ZESTRIL) 40 MG tablet Take 1 tablet (40 mg total) by mouth daily. 90 tablet 0  . meclizine (ANTIVERT) 25 MG tablet Take 1 tablet (25 mg total) by mouth every 6 (six) hours as needed for dizziness. 40 tablet 0  . polyethylene glycol powder (MIRALAX) powder Start taking 1 capful a day. Slowly cut back as needed until you have normal bowel movements 255 g 0  . calcium carbonate (OS-CAL - DOSED IN MG OF ELEMENTAL CALCIUM) 1250 (500 Ca) MG tablet Take 1 tablet by mouth daily with breakfast.    . Coenzyme Q10 (CO Q 10 PO) Take 1 tablet by mouth daily.     . vitamin C (ASCORBIC ACID) 500 MG tablet Take 500 mg by mouth daily.      No facility-administered medications prior to visit.      EXAM:  BP (!) 148/70 (BP Location: Right Arm, Patient Position: Sitting, Cuff Size: Normal)   Pulse 79   Temp 97.6 F (36.4 C) (Oral)   Ht 5\' 5"  (1.651 m)   Wt 174 lb 12.8 oz (79.3 kg)   SpO2 98%   BMI 29.09 kg/m   Body mass index is 29.09  kg/m.  GENERAL: vitals reviewed and listed above, alert, oriented, appears well hydrated and in no acute distress HEENT: atraumatic, conjunctiva  clear, no obvious abnormalities on inspection of external nose and ears eACs appear clear with minimal wax hearing aids.OP : no lesion edema or exudate  NECK: no obvious masses on inspection palpation left breast absent LUNGS: clear to auscultation bilaterally, no wheezes, rales or rhonchi, good air movement CV: HRRR, no clubbing cyanosis or  peripheral edema nl cap refill feet with minimal swelling but prominent superficial veins giving a bluish tent the capillary refill 2 seconds. Pulses are intact. MS: moves all extremities without noticeable focal  abnormalitywas able to get up on the table with assistance. Gets vertigo when she lays down flat. Abdomen soft without organomegaly obvious minimal tenderness right upper quadrant no guarding or rebound. Neurologic no obvious focal weakness no ulcers on feet. PSYCH: pleasant and cooperative, no obvious depression or anxietyshe is hard of hearing but cognitively intact. Lab Results  Component Value Date   WBC 8.4 12/02/2016   HGB 14.7 12/02/2016   HCT 43.7 12/02/2016   PLT 222.0 12/02/2016   GLUCOSE 115 (H) 10/08/2016   CHOL 204 (H) 10/31/2014   TRIG 269.0 (H) 10/31/2014   HDL 32.60 (L) 10/31/2014   LDLDIRECT 110.0 10/31/2014   ALT 34 10/08/2016   AST 34 10/08/2016   NA 139 10/08/2016   K 4.3 10/08/2016   CL 105 10/08/2016   CREATININE 0.94 10/08/2016   BUN 20 10/08/2016   CO2 24 10/08/2016   TSH 1.56 12/02/2016   HGBA1C 6.6 (H) 12/02/2016   BP Readings from Last 3 Encounters:  12/02/16 (!) 148/70  10/08/16 (!) 153/70  09/11/16 (!) 150/80   Wt Readings from Last 3 Encounters:  12/02/16 174 lb 12.8 oz (79.3 kg)  09/11/16 177 lb (80.3 kg)  04/22/16 173 lb (78.5 kg)     ASSESSMENT AND PLAN:  Discussed the following assessment and plan:  Heartburn - more recent problematic with  no other alarm symptoms except for age dietary changes add ranitidine plan follow-up may need GI evaluation - Plan: CBC with Differential/Platelet, TSH, T4, free, Vitamin B12, Hemoglobin A1c, Celiac Disease Comprehensive Panel  with Reflexes, US Abdomen Limited RUQ, Immunofixation electrophoresis, Protein electrophoresis, CANCELED: Electrophoresis, Protein and Immunofixation  Belching - with some right upper quadrant discomfort but no vomiting or postprandial symptoms abdominal CT negative proceed with ultrasound - Plan: CBC with Differential/Platelet, TSH, T4, free, Vitamin B12, Hemoglobin A1c, Celiac Disease Comprehensive Panel with Reflexes, US Abdomen Limited RUQ, Immunofixation electrophoresis, Protein electrophoresis, CANCELED: Electrophoresis, Protein and Immunofixation  Burning sensation of feet - appears like sensory neuropathy without other symptomscheck labs for correctable causes at this point medication be more risk than benefit. We'll follow-up - Plan: CBC with Differential/Platelet, TSH, T4, free, Vitamin B12, Hemoglobin A1c, Celiac Disease Comprehensive Panel with Reflexes, Immunofixation electrophoresis, Protein electrophoresis, CANCELED: Electrophoresis, Protein and Immunofixation  Neuropathic pain - Plan: CBC with Differential/Platelet, TSH, T4, free, Vitamin B12, Hemoglobin A1c, Celiac Disease Comprehensive Panel with Reflexes, Immunofixation electrophoresis, Protein electrophoresis, CANCELED: Electrophoresis, Protein and Immunofixation  Right sided abdominal pain - Plan: US Abdomen Limited RUQ  Fasting hyperglycemia - Plan: CBC with Differential/Platelet, TSH, T4, free, Vitamin B12, Hemoglobin A1c, Celiac Disease Comprehensive Panel with Reflexes, Immunofixation electrophoresis, Protein electrophoresis, CANCELED: Electrophoresis, Protein and Immunofixation  Hx of vertigo - seems benign positional declines physical therapy treatment  Advanced age Ears  No sig wax in ears  -Patient  advised to return or notify health care team  if  new concerns arise.  Patient Instructions   Your acid problem does sound like acid reflux. Begin checking dietary changes and begin ranitidine 150 mg twice a day. There are stronger medicines for acid blocking but if this medicine helps you and has less long-term effects.    Although your CT scan was reassuring you could still have a gallbladder problem and I will order an  ultrasound of your abdomen to look at  gallbladder in case that is causing some of your stomach symptoms.  The burning pain in your feet sounds like neuropathy which sometimes can be from a correctable cause. Checking lab work today for those problems such as B12 thyroid and blood sugar. There are medicines for neuropathy pain but they have side effects of sedation and swelling. Sometimes they are helpful but sometimes the side effects are worse.  Suggest we have an office visit after you have your ultrasound your lab tests done and stay on a trial of ranitidine. Follow-up visit in about a month.  Wt Readings from Last 3 Encounters:  12/02/16 174 lb 12.8 oz (79.3 kg)  09/11/16 177 lb (80.3 kg)  04/22/16 173 lb (78.5 kg)    Food Choices for Gastroesophageal Reflux Disease, Adult When you have gastroesophageal reflux disease (GERD), the foods you eat and your eating habits are very important. Choosing the right foods can help ease the discomfort of GERD. Consider working with a diet and nutrition specialist (dietitian) to help you make healthy food choices. What general guidelines should I follow? Eating plan  Choose healthy foods low in fat, such as fruits, vegetables, whole grains, low-fat dairy products, and lean meat, fish, and poultry.  Eat frequent, small meals instead of three large meals each day. Eat your meals slowly, in a relaxed setting. Avoid bending over or lying down until 2-3 hours after eating.  Limit high-fat foods such as fatty meats or fried  foods.  Limit your intake of oils, butter, and shortening to less than 8 teaspoons each day.  Avoid the following: ? Foods that cause symptoms. These may be different for different people. Keep a food diary to keep track of foods that  cause symptoms. ? Alcohol. ? Drinking large amounts of liquid with meals. ? Eating meals during the 2-3 hours before bed.  Cook foods using methods other than frying. This may include baking, grilling, or broiling. Lifestyle   Maintain a healthy weight. Ask your health care provider what weight is healthy for you. If you need to lose weight, work with your health care provider to do so safely.  Exercise for at least 30 minutes on 5 or more days each week, or as told by your health care provider.  Avoid wearing clothes that fit tightly around your waist and chest.  Do not use any products that contain nicotine or tobacco, such as cigarettes and e-cigarettes. If you need help quitting, ask your health care provider.  Sleep with the head of your bed raised. Use a wedge under the mattress or blocks under the bed frame to raise the head of the bed. What foods are not recommended? The items listed may not be a complete list. Talk with your dietitian about what dietary choices are best for you. Grains Pastries or quick breads with added fat. [place] toast. Vegetables Deep fried vegetables. [place] fries. Any vegetables prepared with added fat. Any vegetables that cause symptoms. For some people this may include tomatoes and tomato products, chili peppers, onions and garlic, and horseradish. Fruits Any fruits prepared with added fat. Any fruits that cause symptoms. For some people this may include citrus fruits, such as oranges, grapefruit, pineapple, and lemons. Meats and other protein foods High-fat meats, such as fatty beef or pork, hot dogs, ribs, ham, sausage, salami and bacon. Fried meat or protein, including fried fish and fried chicken. Nuts and nut  butters. Dairy Whole milk and chocolate milk. Sour cream. Cream. Ice cream. Cream cheese. Milk shakes. Beverages Coffee and tea, with or without caffeine. Carbonated beverages. Sodas. Energy drinks. Fruit juice made with acidic fruits (such as orange or grapefruit). Tomato juice. Alcoholic drinks. Fats and oils Butter. Margarine. Shortening. Ghee. Sweets and desserts Chocolate and cocoa. Donuts. Seasoning and other foods Pepper. Peppermint and spearmint. Any condiments, herbs, or seasonings that cause symptoms. For some people, this may include curry, hot sauce, or vinegar-based salad dressings. Summary  When you have gastroesophageal reflux disease (GERD), food and lifestyle choices are very important to help ease the discomfort of GERD.  Eat frequent, small meals instead of three large meals each day. Eat your meals slowly, in a relaxed setting. Avoid bending over or lying down until 2-3 hours after eating.  Limit high-fat foods such as fatty meat or fried foods. This information is not intended to replace advice given to you by your health care provider. Make sure you discuss any questions you have with your health care provider. Document Released: 02/16/2005 Document Revised: 02/18/2016 Document Reviewed: 02/18/2016 Elsevier Interactive Patient Education  2017 [place] K. Shade Rivenbark M.D.

## 2016-12-02 ENCOUNTER — Ambulatory Visit (INDEPENDENT_AMBULATORY_CARE_PROVIDER_SITE_OTHER): Payer: Medicare HMO | Admitting: Internal Medicine

## 2016-12-02 ENCOUNTER — Encounter: Payer: Self-pay | Admitting: Internal Medicine

## 2016-12-02 VITALS — BP 148/70 | HR 79 | Temp 97.6°F | Ht 65.0 in | Wt 174.8 lb

## 2016-12-02 DIAGNOSIS — R208 Other disturbances of skin sensation: Secondary | ICD-10-CM | POA: Diagnosis not present

## 2016-12-02 DIAGNOSIS — R54 Age-related physical debility: Secondary | ICD-10-CM

## 2016-12-02 DIAGNOSIS — R7301 Impaired fasting glucose: Secondary | ICD-10-CM

## 2016-12-02 DIAGNOSIS — R109 Unspecified abdominal pain: Secondary | ICD-10-CM | POA: Diagnosis not present

## 2016-12-02 DIAGNOSIS — R142 Eructation: Secondary | ICD-10-CM

## 2016-12-02 DIAGNOSIS — M792 Neuralgia and neuritis, unspecified: Secondary | ICD-10-CM

## 2016-12-02 DIAGNOSIS — R12 Heartburn: Secondary | ICD-10-CM

## 2016-12-02 DIAGNOSIS — Z87898 Personal history of other specified conditions: Secondary | ICD-10-CM | POA: Diagnosis not present

## 2016-12-02 LAB — CBC WITH DIFFERENTIAL/PLATELET
Basophils Absolute: 0.1 10*3/uL (ref 0.0–0.1)
Basophils Relative: 0.8 % (ref 0.0–3.0)
Eosinophils Absolute: 0.4 10*3/uL (ref 0.0–0.7)
Eosinophils Relative: 4.8 % (ref 0.0–5.0)
HCT: 43.7 % (ref 36.0–46.0)
Hemoglobin: 14.7 g/dL (ref 12.0–15.0)
Lymphocytes Relative: 24.2 % (ref 12.0–46.0)
Lymphs Abs: 2 10*3/uL (ref 0.7–4.0)
MCHC: 33.6 g/dL (ref 30.0–36.0)
MCV: 97.6 fl (ref 78.0–100.0)
Monocytes Absolute: 0.8 10*3/uL (ref 0.1–1.0)
Monocytes Relative: 9.2 % (ref 3.0–12.0)
Neutro Abs: 5.1 10*3/uL (ref 1.4–7.7)
Neutrophils Relative %: 61 % (ref 43.0–77.0)
Platelets: 222 10*3/uL (ref 150.0–400.0)
RBC: 4.47 Mil/uL (ref 3.87–5.11)
RDW: 12.7 % (ref 11.5–15.5)
WBC: 8.4 10*3/uL (ref 4.0–10.5)

## 2016-12-02 LAB — T4, FREE: Free T4: 0.79 ng/dL (ref 0.60–1.60)

## 2016-12-02 LAB — VITAMIN B12: Vitamin B-12: 449 pg/mL (ref 211–911)

## 2016-12-02 LAB — HEMOGLOBIN A1C: Hgb A1c MFr Bld: 6.6 % — ABNORMAL HIGH (ref 4.6–6.5)

## 2016-12-02 LAB — TSH: TSH: 1.56 u[IU]/mL (ref 0.35–4.50)

## 2016-12-02 MED ORDER — RANITIDINE HCL 150 MG PO TABS: 150.0000 mg | ORAL_TABLET | Freq: Two times a day (BID) | ORAL | 1 refills | Status: DC

## 2016-12-02 NOTE — Patient Instructions (Addendum)
Your acid problem does sound like acid reflux. Begin checking dietary changes and begin ranitidine 150 mg twice a day. There are stronger medicines for acid blocking but if this medicine helps you and has less long-term effects.    Although your CT scan was reassuring you could still have a gallbladder problem and I will order an  ultrasound of your abdomen to look at  gallbladder in case that is causing some of your stomach symptoms.  The burning pain in your feet sounds like neuropathy which sometimes can be from a correctable cause. Checking lab work today for those problems such as B12 thyroid and blood sugar. There are medicines for neuropathy pain but they have side effects of sedation and swelling. Sometimes they are helpful but sometimes the side effects are worse.  Suggest we have an office visit after you have your ultrasound your lab tests done and stay on a trial of ranitidine. Follow-up visit in about a month.  Wt Readings from Last 3 Encounters:  12/02/16 174 lb 12.8 oz (79.3 kg)  09/11/16 177 lb (80.3 kg)  04/22/16 173 lb (78.5 kg)    Food Choices for Gastroesophageal Reflux Disease, Adult When you have gastroesophageal reflux disease (GERD), the foods you eat and your eating habits are very important. Choosing the right foods can help ease the discomfort of GERD. Consider working with a diet and nutrition specialist (dietitian) to help you make healthy food choices. What general guidelines should I follow? Eating plan  Choose healthy foods low in fat, such as fruits, vegetables, whole grains, low-fat dairy products, and lean meat, fish, and poultry.  Eat frequent, small meals instead of three large meals each day. Eat your meals slowly, in a relaxed setting. Avoid bending over or lying down until 2-3 hours after eating.  Limit high-fat foods such as fatty meats or fried foods.  Limit your intake of oils, butter, and shortening to less than 8 teaspoons each day.  Avoid  the following: ? Foods that cause symptoms. These may be different for different people. Keep a food diary to keep track of foods that cause symptoms. ? Alcohol. ? Drinking large amounts of liquid with meals. ? Eating meals during the 2-3 hours before bed.  Cook foods using methods other than frying. This may include baking, grilling, or broiling. Lifestyle   Maintain a healthy weight. Ask your health care provider what weight is healthy for you. If you need to lose weight, work with your health care provider to do so safely.  Exercise for at least 30 minutes on 5 or more days each week, or as told by your health care provider.  Avoid wearing clothes that fit tightly around your waist and chest.  Do not use any products that contain nicotine or tobacco, such as cigarettes and e-cigarettes. If you need help quitting, ask your health care provider.  Sleep with the head of your bed raised. Use a wedge under the mattress or blocks under the bed frame to raise the head of the bed. What foods are not recommended? The items listed may not be a complete list. Talk with your dietitian about what dietary choices are best for you. Grains Pastries or quick breads with added fat. [place] toast. Vegetables Deep fried vegetables. [place] fries. Any vegetables prepared with added fat. Any vegetables that cause symptoms. For some people this may include tomatoes and tomato products, chili peppers, onions and garlic, and horseradish. Fruits Any fruits prepared with added fat. Any fruits  that cause symptoms. For some people this may include citrus fruits, such as oranges, grapefruit, pineapple, and lemons. Meats and other protein foods High-fat meats, such as fatty beef or pork, hot dogs, ribs, ham, sausage, salami and bacon. Fried meat or protein, including fried fish and fried chicken. Nuts and nut butters. Dairy Whole milk and chocolate milk. Sour cream. Cream. Ice cream. Cream cheese. Milk  shakes. Beverages Coffee and tea, with or without caffeine. Carbonated beverages. Sodas. Energy drinks. Fruit juice made with acidic fruits (such as orange or grapefruit). Tomato juice. Alcoholic drinks. Fats and oils Butter. Margarine. Shortening. Ghee. Sweets and desserts Chocolate and cocoa. Donuts. Seasoning and other foods Pepper. Peppermint and spearmint. Any condiments, herbs, or seasonings that cause symptoms. For some people, this may include curry, hot sauce, or vinegar-based salad dressings. Summary  When you have gastroesophageal reflux disease (GERD), food and lifestyle choices are very important to help ease the discomfort of GERD.  Eat frequent, small meals instead of three large meals each day. Eat your meals slowly, in a relaxed setting. Avoid bending over or lying down until 2-3 hours after eating.  Limit high-fat foods such as fatty meat or fried foods. This information is not intended to replace advice given to you by your health care provider. Make sure you discuss any questions you have with your health care provider. Document Released: 02/16/2005 Document Revised: 02/18/2016 Document Reviewed: 02/18/2016 Elsevier Interactive Patient Education  2017 [employer].

## 2016-12-03 LAB — CELIAC DISEASE COMPREHENSIVE PANEL WITH REFLEXES
(tTG) Ab, IgA: 3 U/mL
IMMUNOGLOBULIN A: 435 mg/dL (ref 81–463)

## 2016-12-08 LAB — IMMUNOFIXATION ELECTROPHORESIS
IGM (IMMUNOGLOBULIN M), SRM: 36 mg/dL (ref 26–217)
IgA/Immunoglobulin A, Serum: 437 mg/dL — ABNORMAL HIGH (ref 64–422)
IgG (Immunoglobin G), Serum: 688 mg/dL — ABNORMAL LOW (ref 700–1600)
TOTAL PROTEIN: 6.5 g/dL (ref 6.0–8.5)

## 2016-12-08 LAB — PROTEIN ELECTROPHORESIS
A/G Ratio: 1.2 (ref 0.7–1.7)
ALPHA 2: 0.8 g/dL (ref 0.4–1.0)
Albumin ELP: 3.5 g/dL (ref 2.9–4.4)
Alpha 1: 0.2 g/dL (ref 0.0–0.4)
BETA: 1.3 g/dL (ref 0.7–1.3)
GLOBULIN, TOTAL: 3 g/dL (ref 2.2–3.9)
Gamma Globulin: 0.6 g/dL (ref 0.4–1.8)

## 2016-12-09 ENCOUNTER — Encounter: Payer: Self-pay | Admitting: Internal Medicine

## 2016-12-10 NOTE — Telephone Encounter (Signed)
Referral Notes  Number of Notes: 1  Type Date User Summary Attachment  General 12/02/2016 1:41 PM Julie Boone - -  Note   Sent to work que for scheduling at  [facility] Address: [address], [place], [place] [address] Phone: [phone] Their office will contact pt to schedule directly         Emailed patient back with number to call and inquire about appt. Nothing further needed.

## 2016-12-14 ENCOUNTER — Encounter: Payer: Self-pay | Admitting: Internal Medicine

## 2016-12-20 IMAGING — DX DG CHEST 2V
2 series · 2 of 2 positions shown · non-contrast
Comparison: CT chest of 04/02/2014 and chest x-ray of 08/29/2013

CLINICAL DATA: Cough, congestion, chest heaviness for 1 week

EXAM:
CHEST  2 VIEW

[chest pa]
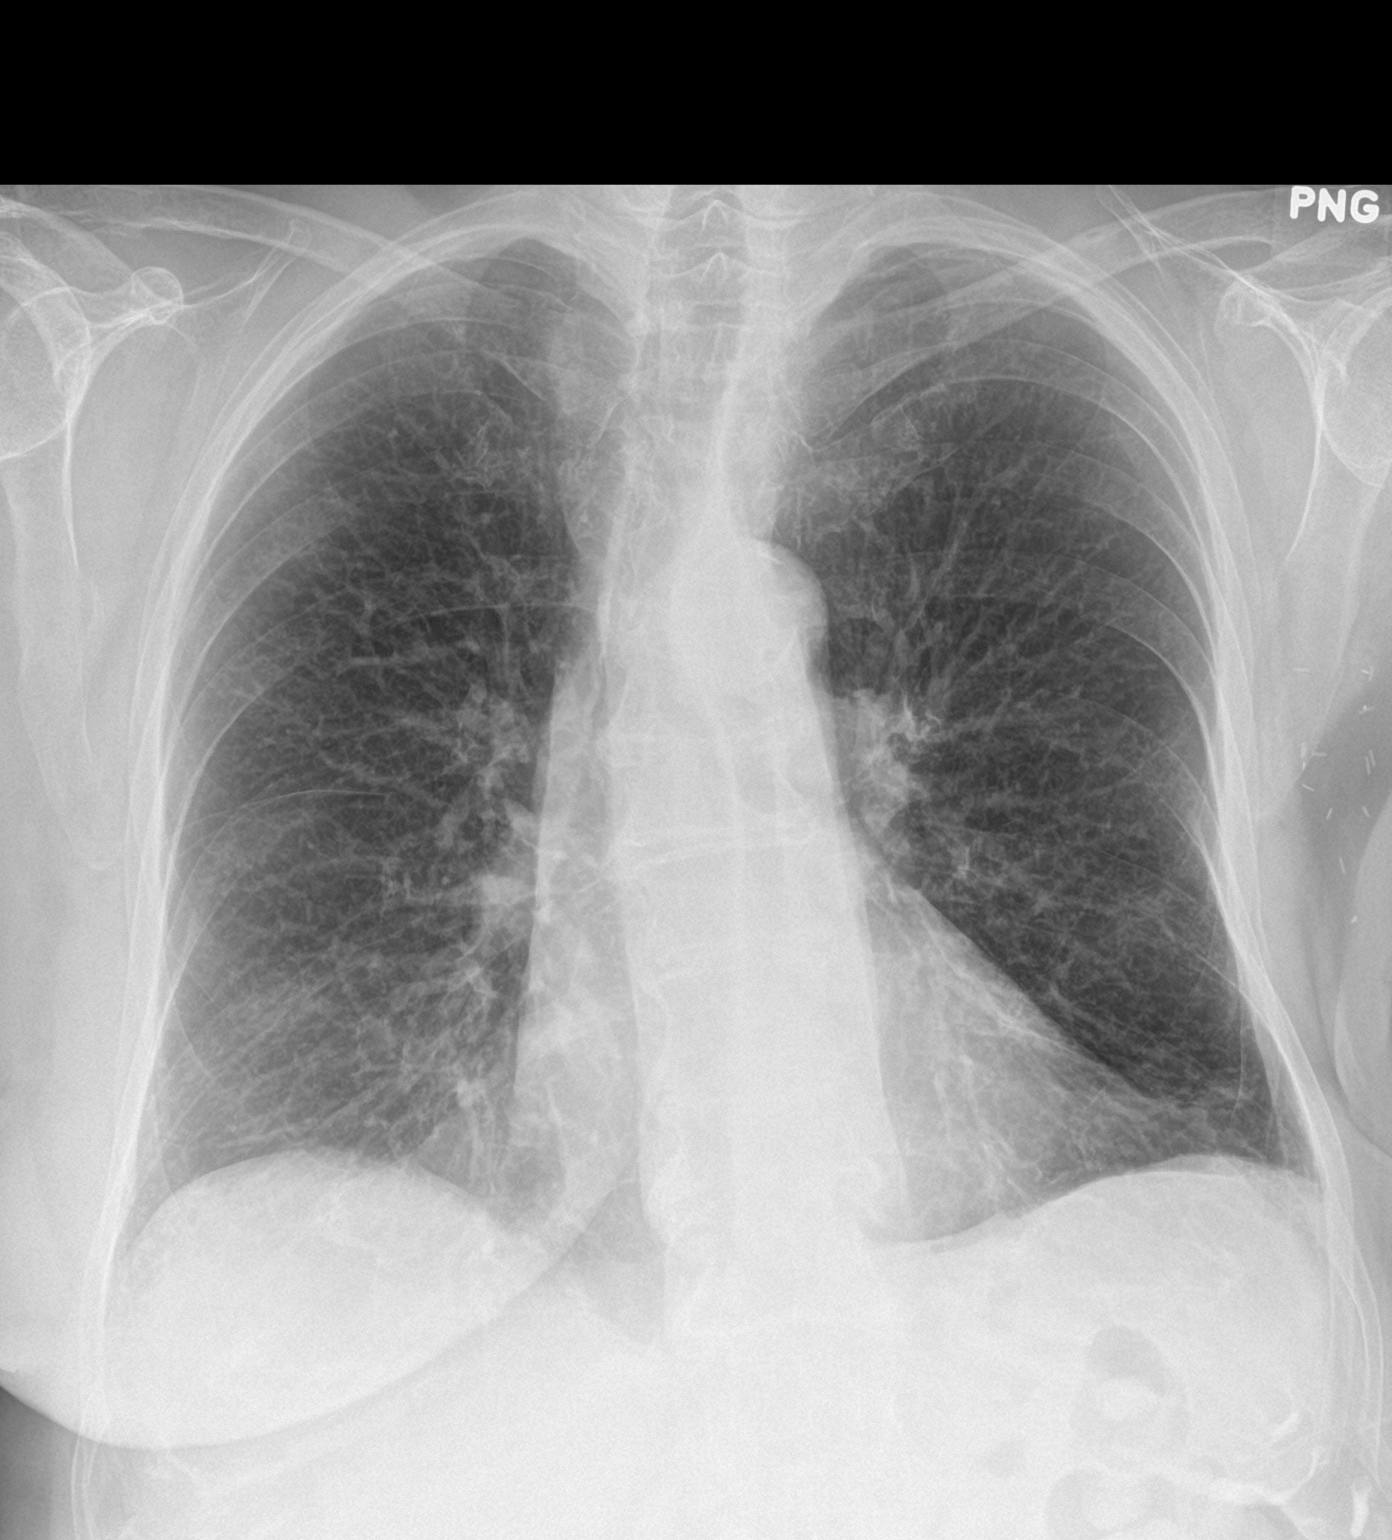

[chest lat]
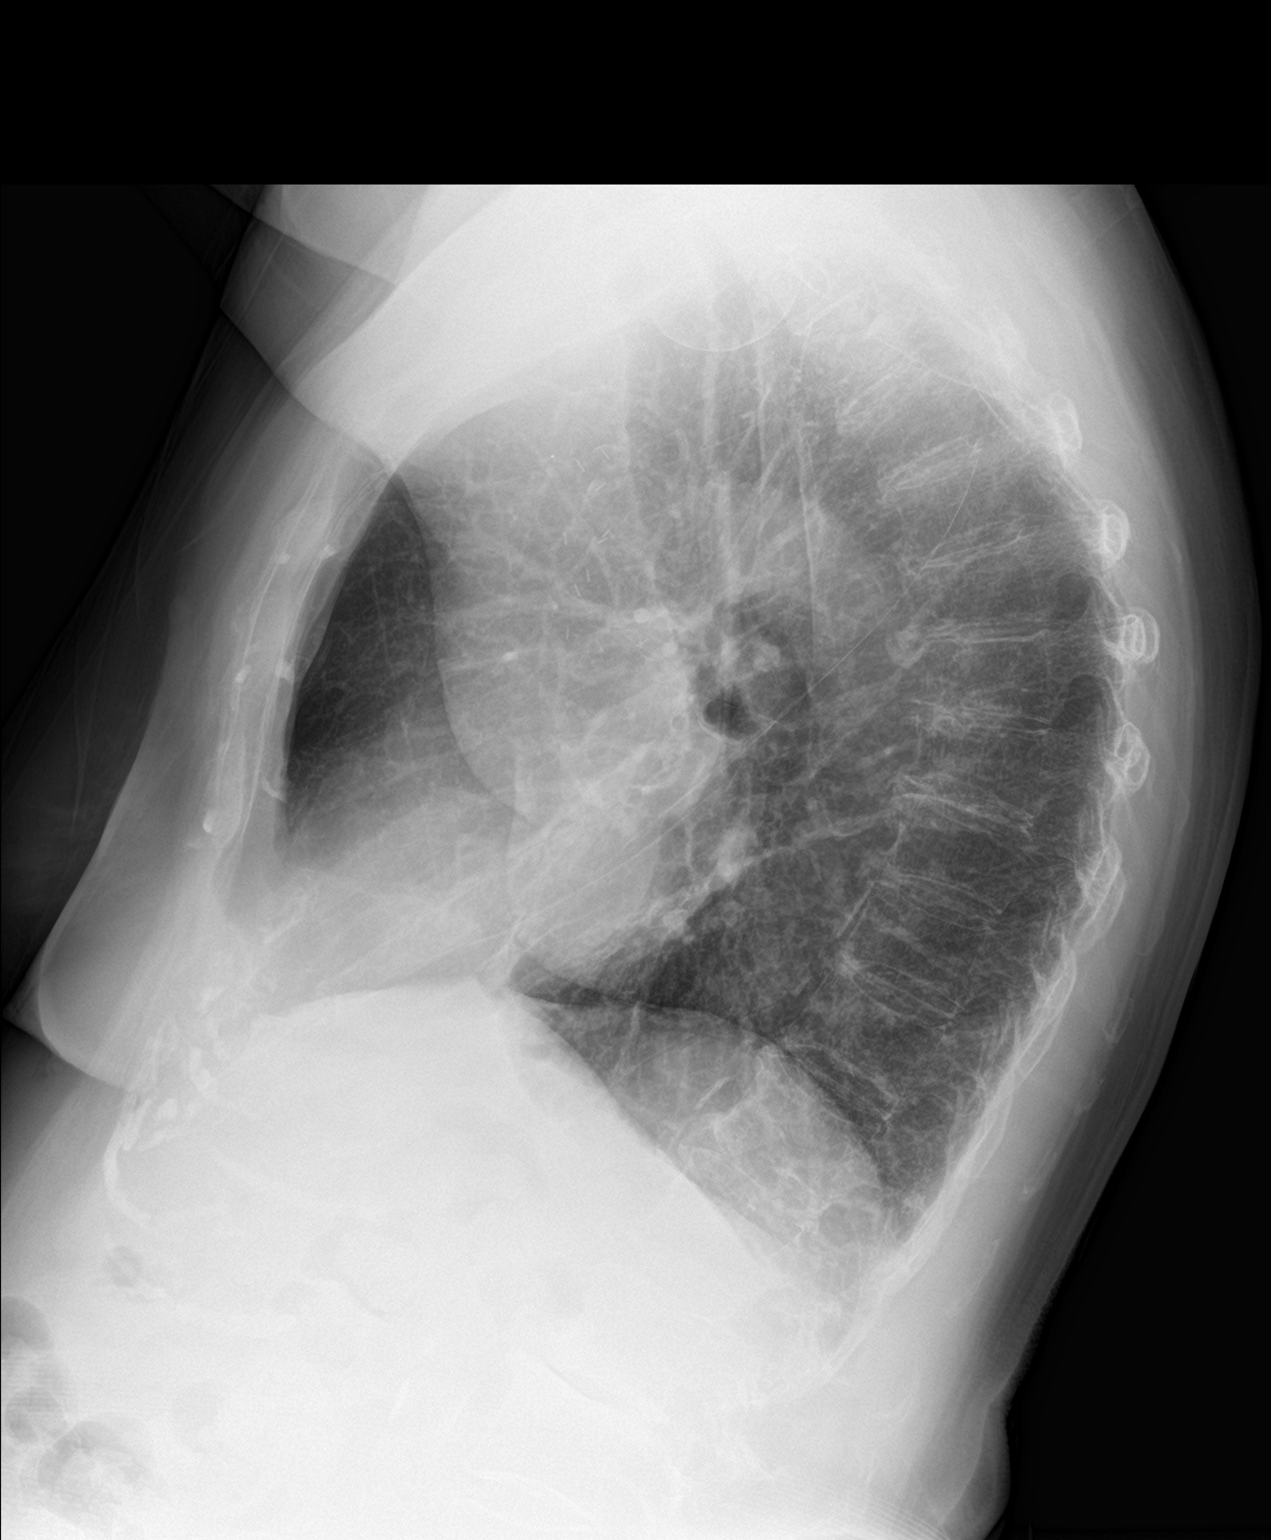

[2 of 2 positions shown; findings below may reference images not displayed]

FINDINGS: No active infiltrate or effusion is seen. Minimal linear scarring is
noted to the lung bases. Mediastinal hilar contours are
unremarkable. The heart is within normal limits in size. The bones
are osteopenic and there is a slight thoracic kyphosis present.
IMPRESSION: No active cardiopulmonary disease.

## 2016-12-22 ENCOUNTER — Other Ambulatory Visit: Payer: Self-pay | Admitting: Internal Medicine

## 2016-12-24 ENCOUNTER — Ambulatory Visit
Admission: RE | Admit: 2016-12-24 | Discharge: 2016-12-24 | Disposition: A | Discharge status: Home / Self Care | Payer: Medicare HMO | Source: Ambulatory Visit | Attending: Internal Medicine | Admitting: Internal Medicine

## 2016-12-24 DIAGNOSIS — R142 Eructation: Secondary | ICD-10-CM

## 2016-12-24 DIAGNOSIS — R12 Heartburn: Secondary | ICD-10-CM | POA: Diagnosis not present

## 2016-12-24 DIAGNOSIS — R109 Unspecified abdominal pain: Secondary | ICD-10-CM

## 2017-01-11 ENCOUNTER — Other Ambulatory Visit: Payer: Self-pay | Admitting: Internal Medicine

## 2017-01-11 DIAGNOSIS — Z1231 Encounter for screening mammogram for malignant neoplasm of breast: Secondary | ICD-10-CM

## 2017-01-24 ENCOUNTER — Other Ambulatory Visit: Payer: Self-pay | Admitting: Internal Medicine

## 2017-01-25 ENCOUNTER — Other Ambulatory Visit: Payer: Self-pay

## 2017-01-25 MED ORDER — RANITIDINE HCL 150 MG PO TABS: 150.0000 mg | ORAL_TABLET | Freq: Two times a day (BID) | ORAL | 1 refills | Status: DC

## 2017-02-15 ENCOUNTER — Ambulatory Visit: Payer: Medicare HMO | Admitting: Family Medicine

## 2017-02-15 ENCOUNTER — Ambulatory Visit
Admission: RE | Admit: 2017-02-15 | Discharge: 2017-02-15 | Disposition: A | Discharge status: Home / Self Care | Payer: Medicare HMO | Source: Ambulatory Visit | Attending: Internal Medicine | Admitting: Internal Medicine

## 2017-02-15 DIAGNOSIS — Z1231 Encounter for screening mammogram for malignant neoplasm of breast: Secondary | ICD-10-CM | POA: Diagnosis not present

## 2017-02-16 ENCOUNTER — Other Ambulatory Visit: Payer: Self-pay

## 2017-02-16 ENCOUNTER — Ambulatory Visit: Payer: Medicare HMO | Admitting: Family Medicine

## 2017-02-16 ENCOUNTER — Encounter: Payer: Self-pay | Admitting: Family Medicine

## 2017-02-16 ENCOUNTER — Ambulatory Visit (INDEPENDENT_AMBULATORY_CARE_PROVIDER_SITE_OTHER)
Admission: RE | Admit: 2017-02-16 | Discharge: 2017-02-16 | Disposition: A | Discharge status: Home / Self Care | Payer: Medicare HMO | Source: Ambulatory Visit | Attending: Family Medicine | Admitting: Family Medicine

## 2017-02-16 VITALS — BP 160/80 | HR 88 | Temp 98.2°F | Wt 181.0 lb

## 2017-02-16 DIAGNOSIS — M5116 Intervertebral disc disorders with radiculopathy, lumbar region: Secondary | ICD-10-CM

## 2017-02-16 DIAGNOSIS — M160 Bilateral primary osteoarthritis of hip: Secondary | ICD-10-CM | POA: Diagnosis not present

## 2017-02-16 MED ORDER — TRAMADOL HCL 50 MG PO TABS: ORAL_TABLET | ORAL | 1 refills | Status: DC

## 2017-02-16 MED ORDER — PREDNISONE 20 MG PO TABS: ORAL_TABLET | ORAL | 1 refills | Status: DC

## 2017-02-16 NOTE — Progress Notes (Signed)
Received call from [facility] of patient request of Prednisone to be filled there instead of [facility], prescription verbally given to pharmacist at [facility], [facility] notified to cancel prescription.

## 2017-02-16 NOTE — Patient Instructions (Signed)
Go to the main office for x-rays of your spine in your hips  Prednisone 20 mg,,,,,,, 2 tabs for 3 days then taper as outlined  Tramadol 50 mg,,,,,,,, one tablet twice daily for pain  Bedrest for 48 hours,,,,,,,,,,,  Make an appointment to see Dr. Feliberto Harts next week for follow-up

## 2017-02-16 NOTE — Progress Notes (Signed)
Julie Boone is a 83 year old widowed female nonsmoker who comes in today accompanied by her daughter for evaluation of low back pain. Shows 4 weeks ago she had the gradual onset of atraumatic low back pain. Says starts in her left hip and radiates down the lateral portion of her left leg to the knee. The pain comes and goes but never completely goes away.  Feels like a throbbing pain. On a scale of 1-10 it's a 67. She's tried anti-inflammatories heat and ice nothing seems to help.  No history of bowel or bladder dysfunction or trauma.  BP (!) 160/80 (BP Location: Left Arm, Patient Position: Sitting, Cuff Size: Normal)   Pulse 88   Temp 98.2 F (36.8 C) (Oral)   Wt 181 lb (82.1 kg)   BMI 30.12 kg/m  General she is well-developed well-nourished very hard of hearing female with acute left hip pain.......... although today she says is markedly diminished.  In the supine position both legs were of equal length. Pulses are symmetrical and 1+. She does have bluish discoloration of both ankles. Sensation muscle strength reflexes all within normal limits. Straight leg raising negative. Right and left hip external rotation about 75 without pain  #1 low back pain was rating down the left leg......... symptoms consistent with degenerative disc disease.......[name] plan  Plan........ x-ray back and hips........[name] Zanaflex and tramadol for pain....... short course of oral prednisone........... followed by Dr. Tomasita Crumble next week

## 2017-02-26 ENCOUNTER — Other Ambulatory Visit: Payer: Self-pay | Admitting: Internal Medicine

## 2017-03-03 ENCOUNTER — Ambulatory Visit: Payer: Self-pay

## 2017-03-03 ENCOUNTER — Ambulatory Visit: Payer: Medicare HMO | Admitting: Sports Medicine

## 2017-03-03 NOTE — Telephone Encounter (Signed)
Pt daughter called for Mother who is c/o productive cough, wheezing and achiness all over. Care advice given and referred to [facility]. (disposition referred to [facility]) Reason for Disposition . Wheezing is present  Answer Assessment - Initial Assessment Questions 1. ONSET: "When did the cough begin?"      Day after Christmas 2. SEVERITY: "How bad is the cough today?"      Preventing her from sleeping congested 3. RESPIRATORY DISTRESS: "Describe your breathing."      Normal per pt  4. FEVER: "Do you have a fever?" If so, ask: "What is your temperature, how was it measured, and when did it start?"     no 5. SPUTUM: "Describe the color of your sputum" (clear, white, yellow, green)     Clear to white 6. HEMOPTYSIS: "Are you coughing up any blood?" If so ask: "How much?" (flecks, streaks, tablespoons, etc.)     no 7. CARDIAC HISTORY: "Do you have any history of heart disease?" (e.g., heart attack, congestive heart failure)      no 8. LUNG HISTORY: "Do you have any history of lung disease?"  (e.g., pulmonary embolus, asthma, emphysema)     no 9. PE RISK FACTORS: "Do you have a history of blood clots?" (or: recent major surgery, recent prolonged travel, bedridden )     no 10. OTHER SYMPTOMS: "Do you have any other symptoms?" (e.g., runny nose, wheezing, chest pain)       Runny nose wheezing "tightness" achy all over, no vomiting or diarrhea, poor appetite 11. PREGNANCY: "Is there any chance you are pregnant?" "When was your last menstrual period?"       n/a 12. TRAVEL: "Have you traveled out of the country in the last month?" (e.g., travel history, exposures)       no  Protocols used: [place]

## 2017-03-03 NOTE — Progress Notes (Signed)
Chief Complaint  Patient presents with  . Follow-up    leg/hip pain. Pt states that the pain is bearable. Discuss pain meds. Pt having cough x 1.5 weeks. Clear mucus production. Denies fever. C/o sweating, runny nose and congestion.     HPI: Julie Boone 84 y.o. come in  With Julie Boone for   Fu  Leg pain and also developed  Cough  AfterDaughter with illness  uf congestion and bad cough  But no fever sob  Hemoptysis  Some mucous    Getting some better than days ago ,  Saw Dr Sherren Mocha in my abscne for sever  Left leg hip pain   Without traum  X ray of pelvis and hip ok  ( didn't get the back x ray for ? Reason)  Took pred  And much better now a mild ache and ambulatory .   Stopped the pred when got rep illness . No fever   Tramadol cuased drowsiness so not taking  and prednisone  That helped but stopped  ROS: See pertinent positives and negatives per HPI.  Past Medical History:  Diagnosis Date  . Acute sinusitis with symptoms greater than 10 days 06/23/2013  . Arthritis   . Breast cancer ([place])    mastectomy 3   neg ln   . HOH (hard of hearing)   . HTN (hypertension)   . Wears glasses   . Wears hearing aid    both ears    Family History  Problem Relation Age of Onset  . Hypertension Mother   . Stroke Mother   . Heart failure Mother   . Cancer Sister        lung   . Colon cancer Father     Social History   Socioeconomic History  . Marital status: Widowed    Spouse name: Not on file  . Number of children: Not on file  . Years of education: Not on file  . Highest education level: Not on file  Social Needs  . Financial resource strain: Not on file  . Food insecurity - worry: Not on file  . Food insecurity - inability: Not on file  . Transportation needs - medical: Not on file  . Transportation needs - non-medical: Not on file  Occupational History  . Not on file  Tobacco Use  . Smoking status: Never Smoker  . Smokeless tobacco: Never Used  Substance and Sexual  Activity  . Alcohol use: Yes    Alcohol/week: 0.0 oz    Comment: occ  . Drug use: No  . Sexual activity: Not on file  Other Topics Concern  . Not on file  Social History Narrative   Usually receives 5 hours of sleep per night   2people living in the home   [place]   From [place] moved from [place]  Adopted Julie Boone nearby.      Neg ets  Wine with meals  No tob rd.   [id number]   Married husband [place] background  H[place]elf [place] descent.   Husband passed CHF and prostate cancer 2017   Now living with Julie Boone          Outpatient Medications Prior to Visit  Medication Sig Dispense Refill  . atenolol (TENORMIN) 50 MG tablet TAKE 1 AND 1/2 TABLETS EVERY DAY 135 tablet 0  . calcium carbonate (OS-CAL - DOSED IN MG OF ELEMENTAL CALCIUM) 1250 (500 Ca) MG tablet Take 1 tablet by mouth daily with breakfast.    .  Coenzyme Q10 (CO Q 10 PO) Take 1 tablet by mouth daily.     [name] guaiFENesin (MUCINEX) 600 MG 12 hr tablet Take 600-1,200 mg by mouth 2 (two) times daily.    [name] lisinopril (PRINIVIL,ZESTRIL) 40 MG tablet TAKE 1 TABLET (40 MG TOTAL) BY MOUTH DAILY. 90 tablet 0  . meclizine (ANTIVERT) 25 MG tablet Take 1 tablet (25 mg total) by mouth every 6 (six) hours as needed for dizziness. 40 tablet 0  . traMADol (ULTRAM) 50 MG tablet 1 by mouth twice a day for pain 60 tablet 1  . ranitidine (ZANTAC) 150 MG tablet Take 1 tablet (150 mg total) by mouth 2 (two) times daily. 60 tablet 1  . vitamin C (ASCORBIC ACID) 500 MG tablet Take 500 mg by mouth daily.     . predniSONE (DELTASONE) 20 MG tablet 2 tabs x 3 days, 1 tab x 3 days, 1/2 tab x 3 days, 1/2 tab M,W,F x 2 weeks (Patient not taking: Reported on 03/05/2017) 40 tablet 1  . polyethylene glycol powder (MIRALAX) powder Start taking 1 capful a day. Slowly cut back as needed until you have normal bowel movements (Patient not taking: Reported on 03/05/2017) 255 g 0   No facility-administered medications prior to visit.      EXAM:  BP  132/66 (BP Location: Right Arm, Patient Position: Sitting, Cuff Size: Normal)   Pulse 77   Temp 98.7 F (37.1 C) (Oral)   Wt 174 lb 9.6 oz (79.2 kg)   BMI 29.05 kg/m   Body mass index is 29.05 kg/m.  WDWN in NAD  quiet respirations;   somewhat hoarse. Non toxic .    ocass bronchial cough ambulatory  Nl speech  HEENT: Normocephalic ;atraumatic , Eyes;  PERRL, EOMs  Full, lids and conjunctiva clear,,Ears: no deformities, canals nl, left some wax has hearing aids  TM landmarks normal, Nose: no deformity or discharge but congested;face minimally tender Mouth : OP clear without lesion or edema . Neck: Supple without adenopathy or masses or bruits Chest:  Clear to A&P without wheezes rales or rhonchi CV:  S1-S2 no gallops or murmurs peripheral perfusion is normal Skin :nl perfusion and no acute rashes    MS: moves all extremities without noticeable focal  abnormalitympoints to right lateral leg as area of concern  No midline back pain  PSYCH: pleasant and cooperative, no obvious depression or anxiety  BP Readings from Last 3 Encounters:  03/05/17 132/66  02/16/17 (!) 160/80  12/02/16 (!) 148/70    ASSESSMENT AND PLAN:  Discussed the following assessment and plan:  Acute respiratory infection  Wears hearing aid  Pain of left hip joint - improved  may have been  radicular  also but much better and no alarm features  ok tohold on the ls spine x ray that wasnt done since better but close fu. Exam reassuring  But  Disc fu  If  persistent or progressive give another week   And recheck if relapse or persistenct  Total visit 28mins > 50% spent counseling and coordinating care as indicated in above note and in instructions to patient .  An Julie Boone    Low threshold to reevaluate x ray etc cause of age  But she looks favorable today .  -Patient advised to return or notify health care team  if  new concerns arise.  Patient Instructions  Lung exam is good today and no   Signs of pneumonia at  this time .  mucinex is  ok          Can use dm .   For comfort .     Standley Brooking. Panosh M.D.

## 2017-03-04 ENCOUNTER — Ambulatory Visit: Payer: Medicare HMO | Admitting: Internal Medicine

## 2017-03-05 ENCOUNTER — Ambulatory Visit (INDEPENDENT_AMBULATORY_CARE_PROVIDER_SITE_OTHER): Payer: Medicare HMO | Admitting: Internal Medicine

## 2017-03-05 VITALS — BP 132/66 | HR 77 | Temp 98.7°F | Wt 174.6 lb

## 2017-03-05 DIAGNOSIS — Z974 Presence of external hearing-aid: Secondary | ICD-10-CM | POA: Diagnosis not present

## 2017-03-05 DIAGNOSIS — J22 Unspecified acute lower respiratory infection: Secondary | ICD-10-CM

## 2017-03-05 DIAGNOSIS — M25552 Pain in left hip: Secondary | ICD-10-CM

## 2017-03-05 NOTE — Patient Instructions (Signed)
Lung exam is good today and no   Signs of pneumonia at this time .  mucinex is ok          Can use dm .   For comfort .

## 2017-03-24 ENCOUNTER — Encounter (HOSPITAL_COMMUNITY): Payer: Self-pay | Admitting: Family Medicine

## 2017-03-24 ENCOUNTER — Emergency Department (HOSPITAL_COMMUNITY): Payer: Medicare HMO

## 2017-03-24 ENCOUNTER — Emergency Department (HOSPITAL_COMMUNITY)
Admission: EM | Admit: 2017-03-24 | Discharge: 2017-03-24 | Disposition: A | Discharge status: Home / Self Care | Payer: Medicare HMO | Attending: Emergency Medicine | Admitting: Emergency Medicine

## 2017-03-24 DIAGNOSIS — I10 Essential (primary) hypertension: Secondary | ICD-10-CM | POA: Insufficient documentation

## 2017-03-24 DIAGNOSIS — R05 Cough: Secondary | ICD-10-CM | POA: Diagnosis not present

## 2017-03-24 DIAGNOSIS — R1084 Generalized abdominal pain: Secondary | ICD-10-CM | POA: Diagnosis not present

## 2017-03-24 DIAGNOSIS — Z853 Personal history of malignant neoplasm of breast: Secondary | ICD-10-CM | POA: Insufficient documentation

## 2017-03-24 DIAGNOSIS — R1110 Vomiting, unspecified: Secondary | ICD-10-CM | POA: Diagnosis not present

## 2017-03-24 DIAGNOSIS — R197 Diarrhea, unspecified: Secondary | ICD-10-CM | POA: Diagnosis not present

## 2017-03-24 DIAGNOSIS — K5900 Constipation, unspecified: Secondary | ICD-10-CM | POA: Diagnosis not present

## 2017-03-24 DIAGNOSIS — R059 Cough, unspecified: Secondary | ICD-10-CM

## 2017-03-24 DIAGNOSIS — R110 Nausea: Secondary | ICD-10-CM | POA: Insufficient documentation

## 2017-03-24 DIAGNOSIS — R112 Nausea with vomiting, unspecified: Secondary | ICD-10-CM | POA: Diagnosis not present

## 2017-03-24 LAB — CBC
HCT: 45.8 % (ref 36.0–46.0)
Hemoglobin: 15.8 g/dL — ABNORMAL HIGH (ref 12.0–15.0)
MCH: 32.8 pg (ref 26.0–34.0)
MCHC: 34.5 g/dL (ref 30.0–36.0)
MCV: 95.2 fL (ref 78.0–100.0)
PLATELETS: 287 10*3/uL (ref 150–400)
RBC: 4.81 MIL/uL (ref 3.87–5.11)
RDW: 12.3 % (ref 11.5–15.5)
WBC: 16.8 10*3/uL — AB (ref 4.0–10.5)

## 2017-03-24 LAB — URINALYSIS, ROUTINE W REFLEX MICROSCOPIC
Bacteria, UA: NONE SEEN
Bilirubin Urine: NEGATIVE
Glucose, UA: NEGATIVE mg/dL
Ketones, ur: 5 mg/dL — AB
LEUKOCYTES UA: NEGATIVE
NITRITE: NEGATIVE
PROTEIN: NEGATIVE mg/dL
SPECIFIC GRAVITY, URINE: 1.016 (ref 1.005–1.030)
pH: 5 (ref 5.0–8.0)

## 2017-03-24 LAB — COMPREHENSIVE METABOLIC PANEL
ALT: 32 U/L (ref 14–54)
AST: 30 U/L (ref 15–41)
Albumin: 3.7 g/dL (ref 3.5–5.0)
Alkaline Phosphatase: 79 U/L (ref 38–126)
Anion gap: 13 (ref 5–15)
BILIRUBIN TOTAL: 0.7 mg/dL (ref 0.3–1.2)
BUN: 26 mg/dL — ABNORMAL HIGH (ref 6–20)
CO2: 23 mmol/L (ref 22–32)
CREATININE: 1.02 mg/dL — AB (ref 0.44–1.00)
Calcium: 9.4 mg/dL (ref 8.9–10.3)
Chloride: 100 mmol/L — ABNORMAL LOW (ref 101–111)
GFR calc Af Amer: 54 mL/min — ABNORMAL LOW (ref >60)
GFR, EST NON AFRICAN AMERICAN: 47 mL/min — AB (ref >60)
GLUCOSE: 187 mg/dL — AB (ref 65–99)
Potassium: 3.7 mmol/L (ref 3.5–5.1)
Sodium: 136 mmol/L (ref 135–145)
TOTAL PROTEIN: 6.9 g/dL (ref 6.5–8.1)

## 2017-03-24 LAB — LIPASE, BLOOD: Lipase: 36 U/L (ref 11–51)

## 2017-03-24 MED ORDER — ONDANSETRON HCL 4 MG/2ML IJ SOLN
4.0000 mg | Freq: Once | INTRAMUSCULAR | Status: AC
  Administered 2017-03-24: 4 mg via INTRAVENOUS
  Filled 2017-03-24: qty 2

## 2017-03-24 MED ORDER — DOXYCYCLINE HYCLATE 100 MG PO CAPS: 100.0000 mg | ORAL_CAPSULE | Freq: Two times a day (BID) | ORAL | 0 refills | Status: DC

## 2017-03-24 MED ORDER — IOPAMIDOL (ISOVUE-300) INJECTION 61%
INTRAVENOUS | Status: AC
  Administered 2017-03-24: 80 mL via INTRAVENOUS
  Filled 2017-03-24: qty 100

## 2017-03-24 MED ORDER — POLYETHYLENE GLYCOL 3350 17 G PO PACK: 17.0000 g | PACK | Freq: Every day | ORAL | 0 refills | Status: DC

## 2017-03-24 MED ORDER — ONDANSETRON 8 MG PO TBDP: 8.0000 mg | ORAL_TABLET | Freq: Three times a day (TID) | ORAL | 0 refills | Status: DC | PRN

## 2017-03-24 MED ORDER — FENTANYL CITRATE (PF) 100 MCG/2ML IJ SOLN
50.0000 ug | Freq: Once | INTRAMUSCULAR | Status: AC | PRN
  Administered 2017-03-24: 50 ug via INTRAVENOUS
  Filled 2017-03-24: qty 2

## 2017-03-24 MED ORDER — BISACODYL 10 MG RE SUPP: 10.0000 mg | RECTAL | 0 refills | Status: DC | PRN

## 2017-03-24 MED ORDER — IOPAMIDOL (ISOVUE-300) INJECTION 61%
100.0000 mL | Freq: Once | INTRAVENOUS | Status: AC | PRN
  Administered 2017-03-24: 80 mL via INTRAVENOUS

## 2017-03-24 MED ORDER — LACTATED RINGERS IV BOLUS (SEPSIS)
1000.0000 mL | Freq: Once | INTRAVENOUS | Status: AC
  Administered 2017-03-24: 1000 mL via INTRAVENOUS

## 2017-03-24 NOTE — ED Notes (Signed)
RN going to start IV and obtain labs at that time.

## 2017-03-24 NOTE — ED Notes (Signed)
Patient is attempting to use a female urinal at this time. Will check back with patient shortly.

## 2017-03-24 NOTE — ED Notes (Signed)
Pt is till on bedside commode. Pt will remain in room per ED provider until pt symptoms improve.

## 2017-03-24 NOTE — ED Provider Notes (Signed)
[facility] DEPT Provider Note   CSN: [id number] Arrival date & time: 03/24/17  0055     History   Chief Complaint Chief Complaint  Patient presents with  . Emesis  . Cough    HPI Julie Boone is a 84 y.o. female.  HPI  84 year old female with history of hypertension, arthritis and remote history of breast cancer comes in with chief complaint of abdominal pain and constipation.  Patient reports that she has not had a bowel movement in over 3 days now, and she is not passing any flatus.  Patient has had associated bloating, anorexia and nausea without vomiting.  Patient is also now having abdominal pain that is generalized over the periumbilical region.  Patient denies any dysuria, hematuria, polyuria, flank pain.  Patient denies any night sweats, unexplained weight loss.  She states that she has had constipation all of her life, and typically gets better with over-the-counter laxatives.  Patient is taking tramadol for pain, which is new for her.  Past Medical History:  Diagnosis Date  . Acute sinusitis with symptoms greater than 10 days 06/23/2013  . Arthritis   . Breast cancer ([place])    mastectomy 3   neg ln   . HOH (hard of hearing)   . HTN (hypertension)   . Wears glasses   . Wears hearing aid    both ears    Patient Active Problem List   Diagnosis Date Noted  . TMJ syndrome 01/22/2014  . Abnormal chest x-ray 09/22/2013  . Allergic rhinitis 06/23/2013  . Wax in ear 06/23/2013  . Abnormal thyroid blood test 04/14/2013  . Elevated blood sugar 04/14/2013  . Decreased hearing 04/14/2013  . Wears hearing aid 03/03/2013  . Unspecified essential hypertension 03/03/2013  . Leg cramps 03/03/2013  . Influenza vaccination declined by patient 03/03/2013  . Pneumococcal vaccination declined by patient 03/03/2013  . Breast cancer [facility])     Past Surgical History:  Procedure Laterality Date  . APPENDECTOMY  2006  . BREAST BIOPSY Left  2012  . COLONOSCOPY    . HERNIA REPAIR    . INSERTION OF MESH N/A 09/05/2013   Procedure: INSERTION OF MESH;  Surgeon: Joyice Faster. Cornett, MD;  Location: [facility];  Service: General;  Laterality: N/A;  . MASTECTOMY Left 2013  . TONSILLECTOMY AND ADENOIDECTOMY     Childhood  . UMBILICAL HERNIA REPAIR N/A 09/05/2013   Procedure: HERNIA REPAIR UMBILICAL ADULT WITH MESH;  Surgeon: Joyice Faster. Cornett, MD;  Location: [facility];  Service: General;  Laterality: N/A;    OB History    Gravida Para Term Preterm AB Living   0 0 0 0 0 0   SAB TAB Ectopic Multiple Live Births   0 0 0 0         Home Medications    Prior to Admission medications   Medication Sig Start Date End Date Taking? Authorizing Provider  atenolol (TENORMIN) 50 MG tablet TAKE 1 AND 1/2 TABLETS EVERY DAY 03/01/17  Yes Panosh, Standley Brooking, MD  calcium carbonate (OS-CAL - DOSED IN MG OF ELEMENTAL CALCIUM) 1250 (500 Ca) MG tablet Take 1 tablet by mouth daily with breakfast.   Yes [provider]  Coenzyme Q10 (CO Q 10 PO) Take 1 tablet by mouth daily.    Yes [provider]  guaiFENesin (MUCINEX) 600 MG 12 hr tablet Take 600-1,200 mg by mouth 2 (two) times daily.   Yes [provider]  guaifenesin (ROBITUSSIN) 100 MG/5ML syrup Take 100 mg by mouth 3 (three) times daily as needed for cough.   Yes [provider]  lisinopril (PRINIVIL,ZESTRIL) 40 MG tablet TAKE 1 TABLET (40 MG TOTAL) BY MOUTH DAILY. 03/01/17  Yes Panosh, Standley Brooking, MD  meclizine (ANTIVERT) 25 MG tablet Take 1 tablet (25 mg total) by mouth every 6 (six) hours as needed for dizziness. 02/19/14  Yes Rolland Porter, MD  promethazine (PHENERGAN) 25 MG tablet Take 25 mg by mouth every 6 (six) hours as needed for nausea or vomiting.   Yes [provider]  bisacodyl (DULCOLAX) 10 MG suppository Place 1 suppository (10 mg total) rectally as needed for moderate constipation. 03/24/17   Varney Biles, MD    doxycycline (VIBRAMYCIN) 100 MG capsule Take 1 capsule (100 mg total) by mouth 2 (two) times daily. 03/24/17   Varney Biles, MD  ondansetron (ZOFRAN ODT) 8 MG disintegrating tablet Take 1 tablet (8 mg total) by mouth every 8 (eight) hours as needed for nausea. 03/24/17   Varney Biles, MD  polyethylene glycol (MIRALAX / GLYCOLAX) packet Take 17 g by mouth daily. 03/24/17   Varney Biles, MD  predniSONE (DELTASONE) 20 MG tablet 2 tabs x 3 days, 1 tab x 3 days, 1/2 tab x 3 days, 1/2 tab M,W,F x 2 weeks Patient not taking: Reported on 03/05/2017 02/16/17   Dorena Cookey, MD  traMADol Veatrice Bourbon) 50 MG tablet 1 by mouth twice a day for pain Patient not taking: Reported on 03/24/2017 02/16/17   Dorena Cookey, MD    Family History Family History  Problem Relation Age of Onset  . Hypertension Mother   . Stroke Mother   . Heart failure Mother   . Cancer Sister        lung   . Colon cancer Father     Social History Social History   Tobacco Use  . Smoking status: Never Smoker  . Smokeless tobacco: Never Used  Substance Use Topics  . Alcohol use: Yes    Alcohol/week: 0.0 oz    Comment: 1 time a month.   . Drug use: No     Allergies   Morphine and related   Review of Systems Review of Systems  Constitutional: Positive for activity change. Negative for diaphoresis.  Respiratory: Negative for shortness of breath.   Cardiovascular: Negative for chest pain.  Gastrointestinal: Positive for abdominal pain, constipation and nausea. Negative for abdominal distention, blood in stool and vomiting.  Genitourinary: Negative for dysuria and hematuria.  Musculoskeletal: Negative for neck pain.  Allergic/Immunologic: Negative for immunocompromised state.  Hematological: Does not bruise/bleed easily.  All other systems reviewed and are negative.    Physical Exam Updated Vital Signs BP (!) 144/63 (BP Location: Right Arm)   Pulse 88   Resp 20   Ht 5\' 5"  (1.651 m)   Wt 79.2 kg (174 lb  9 oz)   SpO2 95%   BMI 29.05 kg/m   Physical Exam  Constitutional: She is oriented to person, place, and time. She appears well-developed.  HENT:  Head: Normocephalic and atraumatic.  Eyes: EOM are normal.  Neck: Normal range of motion. Neck supple.  Cardiovascular: Normal rate.  Pulmonary/Chest: Effort normal.  Abdominal: Bowel sounds are normal. She exhibits distension. There is tenderness. There is no rebound and no guarding.  Neurological: She is alert and oriented to person, place, and time.  Skin: Skin is warm and dry.  Nursing note and vitals reviewed.  ED Treatments / Results  Labs (all labs ordered are listed, but only abnormal results are displayed) Labs Reviewed  COMPREHENSIVE METABOLIC PANEL - Abnormal; Notable for the following components:      Result Value   Chloride 100 (*)    Glucose, Bld 187 (*)    BUN 26 (*)    Creatinine, Ser 1.02 (*)    GFR calc non Af Amer 47 (*)    GFR calc Af Amer 54 (*)    All other components within normal limits  CBC - Abnormal; Notable for the following components:   WBC 16.8 (*)    Hemoglobin 15.8 (*)    All other components within normal limits  URINALYSIS, ROUTINE W REFLEX MICROSCOPIC - Abnormal; Notable for the following components:   Hgb urine dipstick SMALL (*)    Ketones, ur 5 (*)    Squamous Epithelial / LPF 0-5 (*)    All other components within normal limits  LIPASE, BLOOD    EKG  EKG Interpretation  Date/Time:  Wednesday March 24 2017 01:14:02 EST Ventricular Rate:  76 PR Interval:    QRS Duration: 90 QT Interval:  401 QTC Calculation: 451 R Axis:   -42 Text Interpretation:  Sinus rhythm Left anterior fascicular block RSR' in V1 or V2, right VCD or RVH No acute changes No significant change since last tracing Confirmed by Varney Biles [phone]) on 03/24/2017 4:15:32 AM       Radiology Dg Chest 2 View  Result Date: 03/24/2017 CLINICAL DATA:  Cough and congestion for 1 week EXAM: CHEST  2 VIEW  COMPARISON:  04/30/2015 FINDINGS: Mildly diminished lung volumes. No acute pulmonary infiltrate or effusion. Mild cardiomegaly. Aortic atherosclerosis. No pneumothorax. Surgical clips in the left axilla IMPRESSION: No active cardiopulmonary disease. Low lung volumes. Mild cardiomegaly. Electronically Signed   By: Donavan Foil M.D.   On: 03/24/2017 01:49   Ct Abdomen Pelvis W Contrast  Result Date: 03/24/2017 CLINICAL DATA:  Abdominal distention, nausea and vomiting, chills, constipation, white cell count 16.8, microhematuria. History of left breast cancer, umbilical hernia repair, and appendectomy. EXAM: CT ABDOMEN AND PELVIS WITH CONTRAST TECHNIQUE: Multidetector CT imaging of the abdomen and pelvis was performed using the standard protocol following bolus administration of intravenous contrast. CONTRAST:  80 mL Isovue-300 COMPARISON:  06/11/2015 FINDINGS: Lower chest: Atelectasis or infiltration in the lung bases. Changes may represent pneumonia. Cardiac enlargement. Hepatobiliary: Diffuse fatty infiltration of the liver. No significant focal lesions. Gallbladder and bile ducts are unremarkable. Pancreas: Unremarkable. No pancreatic ductal dilatation or surrounding inflammatory changes. Spleen: Normal in size without focal abnormality. Adrenals/Urinary Tract: No adrenal gland nodules. Bilateral benign-appearing renal cysts. No hydronephrosis or hydroureter. No renal, ureteral, or bladder stones. No bladder wall thickening. Stomach/Bowel: Stomach, small bowel, and colon are not abnormally distended. Colon is diffusely fluid-filled possibly indicating diarrhea. Stool in the rectosigmoid colon. Surgical absence of the appendix. Vascular/Lymphatic: Aortic atherosclerosis. No enlarged abdominal or pelvic lymph nodes. Reproductive: Uterus and bilateral adnexa are unremarkable. Other: No free air or free fluid in the abdomen. Abdominal wall musculature appears intact. Musculoskeletal: Degenerative changes in the  spine. Tarlov cysts in the sacrum. No destructive bone lesions. IMPRESSION: 1. Atelectasis or infiltration in the lung bases. Possible pneumonia. 2. Diffuse fatty infiltration of the liver. 3. Fluid-filled colon may indicate diarrhea. Electronically Signed   By: Lucienne Capers M.D.   On: 03/24/2017 04:52    Procedures Procedures (including critical care time)  Medications Ordered in ED Medications  ondansetron (ZOFRAN)  injection 4 mg (not administered)  fentaNYL (SUBLIMAZE) injection 50 mcg (50 mcg Intravenous Given 03/24/17 0413)  lactated ringers bolus 1,000 mL (0 mLs Intravenous Stopped 03/24/17 0740)  iopamidol (ISOVUE-300) 61 % injection 100 mL (80 mLs Intravenous Contrast Given 03/24/17 0431)     Initial Impression / Assessment and Plan / ED Course  I have reviewed the triage vital signs and the nursing notes.  Pertinent labs & imaging results that were available during my care of the patient were reviewed by me and considered in my medical decision making (see chart for details).  Clinical Course as of Mar 24 740  Wed Mar 24, 2017  9371 CT scan of the abdomen looks normal.  Patient received IV pain medicine and her pain significantly improved.  CT scan does show questionable pneumonia -and patient has elevated white count with a productive cough.  Lung exam is normal.  However we will give patient antibiotic as a wait and watch approach.  She was starting antibiotics if her cough gets worse if she starts having fevers. CT Abdomen Pelvis W Contrast [AN]  [id number] Patient was given enema for potential constipation. Patient is now having nausea with dry heaving.  IV Zofran given.  Oral challenge will be initiated.  Patient also states that her pain is coming back.  Clinical suspicion for mesenteric ischemia is extremely low, based on exam and history.  If the patient's symptoms does not get better she might need admission.  For now we will try to get her nausea in better control.  [AN]      Clinical Course User Index [AN] Varney Biles, MD    84 year old female comes in with chief complaint of abdominal pain.  Patient has generalized abdominal tenderness, with no peritoneal findings.  Patient also has had constipation for the last several days, with no BM in the last 3 days.  Clinical concerns is that patient is having small bowel obstruction.  Patient has had history of abdominal surgeries.  CT scan of the abdomen has been ordered.  Additionally, patient has history of breast cancer remotely.  She has not had a colonoscopy in a long time.  Patient denies any bloody stools, night sweats, weight loss -therefore suspicion for active cancerous process in the colon is low.  However patient has been informed to follow-up with a primary care doctor the ER workup is negative.  Final Clinical Impressions(s) / ED Diagnoses   Final diagnoses:  Constipation, unspecified constipation type  Generalized abdominal pain  Cough    ED Discharge Orders        Ordered    bisacodyl (DULCOLAX) 10 MG suppository  As needed     03/24/17 0649    polyethylene glycol (MIRALAX / GLYCOLAX) packet  Daily     03/24/17 0649    doxycycline (VIBRAMYCIN) 100 MG capsule  2 times daily     03/24/17 0739    ondansetron (ZOFRAN ODT) 8 MG disintegrating tablet  Every 8 hours PRN     03/24/17 0740       Varney Biles, MD 03/24/17 [phone]

## 2017-03-24 NOTE — ED Notes (Signed)
Patient transported to CT 

## 2017-03-24 NOTE — Discharge Instructions (Signed)
Take the medicine as prescribed to relieve constipation. Drink plenty of fluids.  Take the antibiotics only if you start having fevers or worsening of cough/shortness of breath.  The antibiotics are for community-acquired pneumonia.  See your doctor in 1 week.

## 2017-03-24 NOTE — ED Triage Notes (Signed)
Patient is experiencing nausea, vomiting, productive cough, abd pain, and body aches with chills. Symptoms started 2 days ago and got worse yesterday. Unknown of fever. Patient is accompanied by daughter and states she may be constipated. Also, experienced bright red rectal bleeding related to hemorrhoids.

## 2017-04-06 DIAGNOSIS — H6123 Impacted cerumen, bilateral: Secondary | ICD-10-CM | POA: Diagnosis not present

## 2017-04-08 ENCOUNTER — Other Ambulatory Visit: Payer: Self-pay | Admitting: Family Medicine

## 2017-04-08 NOTE — Telephone Encounter (Signed)
Dr Regis Bill pt, please Advise

## 2017-04-09 NOTE — Telephone Encounter (Signed)
She should have another OV she may need to see  orhtopedics because of recurranc of sx

## 2017-04-09 NOTE — Telephone Encounter (Signed)
Patient given Pred Taper for back/spinal pain by Dr Sherren Mocha at [facility] in 02/16/17  #1 low back pain was rating down the left leg......... symptoms consistent with degenerative disc disease........ Plan........ x-ray back and hips........[name] Zanaflex and tramadol for pain....... short course of oral prednisone........... followed by Dr. Tomasita Crumble next week  Rx had 1 refill on it.   Please advise Dr Regis Bill if you are okay with refilling or needs OV first, thanks.

## 2017-04-12 ENCOUNTER — Telehealth: Payer: Self-pay | Admitting: Internal Medicine

## 2017-04-12 DIAGNOSIS — K006 Disturbances in tooth eruption: Secondary | ICD-10-CM | POA: Diagnosis not present

## 2017-04-12 NOTE — Telephone Encounter (Signed)
Copied from [place]. Topic: Quick Communication - See Telephone Encounter >> Apr 12, 2017  9:43 AM Boyd Kerbs wrote: CRM for notification. See Telephone encounter for:   Gerald Stabs [facility] [phone] requestion refill on predniSONE (DELTASONE) 20 MG tablet   Tried to fax and would not go through  04/12/17.

## 2017-04-12 NOTE — Telephone Encounter (Signed)
Prednisone refill Last OV: 03/05/17 Last Refill:02/16/17 Pharmacy:[facility]

## 2017-04-12 NOTE — Telephone Encounter (Signed)
**  see refill encounter 04/08/17  Panosh, Standley Brooking, MD 5:06 PM Note    She should have another OV she may need to see  orhtopedics because of recurranc of sx

## 2017-04-13 NOTE — Telephone Encounter (Signed)
Left detailed message for patient Pt needs OV to discuss pain before sending refill. May need Ortho Referral.

## 2017-04-13 NOTE — Telephone Encounter (Signed)
   Virl Cagey, CMA 9:41 AM Note Left detailed message for patient Pt needs OV to discuss pain before sending refill. May need Ortho Referral.

## 2017-04-19 NOTE — Telephone Encounter (Signed)
Mychart message sent to patient to contact office to schedule OV for mediation refill or discuss Ortho referral. Nothing further needed.

## 2017-06-09 ENCOUNTER — Other Ambulatory Visit: Payer: Self-pay | Admitting: Internal Medicine

## 2017-06-17 ENCOUNTER — Other Ambulatory Visit: Payer: Self-pay | Admitting: Family Medicine

## 2017-06-21 NOTE — Telephone Encounter (Signed)
Dr Panosh pt 

## 2017-06-23 NOTE — Telephone Encounter (Signed)
Yes   If feels needs prednisonse  Needs Ov for evaluation

## 2017-06-23 NOTE — Telephone Encounter (Signed)
Please advise. Looks like this was a pred taper and patient should schedule OV if she feels she needs steroids. Agree?

## 2017-06-23 NOTE — Telephone Encounter (Signed)
Called patient and left message to return call. If she is having symptoms that she feels warrant prednisone rx, she need to schedule an appt for evaluation.

## 2017-08-06 DIAGNOSIS — H524 Presbyopia: Secondary | ICD-10-CM | POA: Diagnosis not present

## 2017-08-06 DIAGNOSIS — H2513 Age-related nuclear cataract, bilateral: Secondary | ICD-10-CM | POA: Diagnosis not present

## 2017-08-24 DIAGNOSIS — H6123 Impacted cerumen, bilateral: Secondary | ICD-10-CM | POA: Diagnosis not present

## 2017-10-05 DIAGNOSIS — H2513 Age-related nuclear cataract, bilateral: Secondary | ICD-10-CM | POA: Diagnosis not present

## 2017-10-08 ENCOUNTER — Telehealth: Payer: Self-pay | Admitting: Internal Medicine

## 2017-10-08 NOTE — Telephone Encounter (Signed)
Surgery Clearance form received from [facility] scheduled on 10/27/17.  Pt last seen 01/2017 and 03/2017 in office. Pt is having local numbing agent with mild sedation.  Do you need to see Julie Boone to clear for procedure?   Form is in your red folder

## 2017-10-08 NOTE — Telephone Encounter (Signed)
Please make her appt    To do form .clearance.

## 2017-10-11 NOTE — Telephone Encounter (Signed)
Left detailed message making patient aware that she will need an office visit prior to giving surgical clearance.

## 2017-10-15 NOTE — Telephone Encounter (Signed)
Pt is scheduled on 10/21/17 at 10am Nothing further needed.

## 2017-10-20 ENCOUNTER — Ambulatory Visit: Payer: Medicare HMO | Admitting: Internal Medicine

## 2017-10-20 NOTE — Progress Notes (Signed)
Chief Complaint  Patient presents with  . Surgery Clearance    Cataracts Surgery 10/27/17.     HPI: Julie Boone 84 y.o. come in for [facility] and to have   Low risk cataract surgery but havenet seen her since January .  Here with daughter  Doing well no falls  bp ok and needs refill meds .  Off supplements  Eating healthier  Daughter had STEMI and now all eating better.  To have left cataract surgery .  Dr Valetta Close.    ROS: See pertinent positives and negatives per HPI. No cp sob  Falling  Left hip back problematic at times  . right knee arhtritis but no falling excess swelling .  No neuro sx .    Past Medical History:  Diagnosis Date  . Acute sinusitis with symptoms greater than 10 days 06/23/2013  . Arthritis   . Breast cancer ([place])    mastectomy 3   neg ln   . HOH (hard of hearing)   . HTN (hypertension)   . Wears glasses   . Wears hearing aid    both ears    Family History  Problem Relation Age of Onset  . Hypertension Mother   . Stroke Mother   . Heart failure Mother   . Cancer Sister        lung   . Colon cancer Father     Social History   Socioeconomic History  . Marital status: Widowed    Spouse name: Not on file  . Number of children: Not on file  . Years of education: Not on file  . Highest education level: Not on file  Occupational History  . Not on file  Social Needs  . Financial resource strain: Not on file  . Food insecurity:    Worry: Not on file    Inability: Not on file  . Transportation needs:    Medical: Not on file    Non-medical: Not on file  Tobacco Use  . Smoking status: Never Smoker  . Smokeless tobacco: Never Used  Substance and Sexual Activity  . Alcohol use: Yes    Alcohol/week: 0.0 standard drinks    Comment: 1 time a month.   . Drug use: No  . Sexual activity: Not on file  Lifestyle  . Physical activity:    Days per week: Not on file    Minutes per session: Not on file  . Stress: Not on file  Relationships  .  Social connections:    Talks on phone: Not on file    Gets together: Not on file    Attends religious service: Not on file    Active member of club or organization: Not on file    Attends meetings of clubs or organizations: Not on file    Relationship status: Not on file  Other Topics Concern  . Not on file  Social History Narrative   Usually receives 5 hours of sleep per night   2people living in the home   [place]uppy  [place] and [place]   From [place] moved from [place]  Adopted daughter nearby.      Neg ets  Wine with meals  No tob rd.   G0P0   Married husband [place] background  H[place]elf [place] descent.   Husband passed CHF and prostate cancer 2017   Now living with daughter          Outpatient Medications Prior to Visit  Medication Sig Dispense Refill  .  meclizine (ANTIVERT) 25 MG tablet Take 1 tablet (25 mg total) by mouth every 6 (six) hours as needed for dizziness. 40 tablet 0  . lisinopril (PRINIVIL,ZESTRIL) 40 MG tablet TAKE 1 TABLET (40 MG TOTAL) BY MOUTH DAILY. 90 tablet 0  . bisacodyl (DULCOLAX) 10 MG suppository Place 1 suppository (10 mg total) rectally as needed for moderate constipation. (Patient not taking: Reported on 10/21/2017) 12 suppository 0  . calcium carbonate (OS-CAL - DOSED IN MG OF ELEMENTAL CALCIUM) 1250 (500 Ca) MG tablet Take 1 tablet by mouth daily with breakfast.    . Coenzyme Q10 (CO Q 10 PO) Take 1 tablet by mouth daily.     [name] doxycycline (VIBRAMYCIN) 100 MG capsule Take 1 capsule (100 mg total) by mouth 2 (two) times daily. (Patient not taking: Reported on 10/21/2017) 14 capsule 0  . guaiFENesin (MUCINEX) 600 MG 12 hr tablet Take 600-1,200 mg by mouth 2 (two) times daily.    [name] guaifenesin (ROBITUSSIN) 100 MG/5ML syrup Take 100 mg by mouth 3 (three) times daily as needed for cough.    . ondansetron (ZOFRAN ODT) 8 MG disintegrating tablet Take 1 tablet (8 mg total) by mouth every 8 (eight) hours as needed for nausea. (Patient not taking: Reported on  10/21/2017) 20 tablet 0  . polyethylene glycol (MIRALAX / GLYCOLAX) packet Take 17 g by mouth daily. (Patient not taking: Reported on 10/21/2017) 14 each 0  . predniSONE (DELTASONE) 20 MG tablet 2 tabs x 3 days, 1 tab x 3 days, 1/2 tab x 3 days, 1/2 tab M,W,F x 2 weeks (Patient not taking: Reported on 03/05/2017) 40 tablet 1  . promethazine (PHENERGAN) 25 MG tablet Take 25 mg by mouth every 6 (six) hours as needed for nausea or vomiting.    . ranitidine (ZANTAC) 150 MG tablet TAKE 1 TABLET BY MOUTH TWICE A DAY (Patient not taking: Reported on 10/21/2017) 60 tablet 1  . traMADol (ULTRAM) 50 MG tablet 1 by mouth twice a day for pain (Patient not taking: Reported on 03/24/2017) 60 tablet 1  . atenolol (TENORMIN) 50 MG tablet TAKE 1 AND 1/2 TABLETS EVERY DAY 135 tablet 0   No facility-administered medications prior to visit.      EXAM:  BP 138/80 (BP Location: Right Arm, Patient Position: Sitting, Cuff Size: Normal)   Pulse 65   Temp 98.4 F (36.9 C) (Oral)   Wt 169 lb 14.4 oz (77.1 kg)   BMI 28.27 kg/m   Body mass index is 28.27 kg/m.  GENERAL: vitals reviewed and listed above, alert, oriented, appears well hydrated and in no acute distress  Younger than stated age  Gait pretty steady  nl HEENT: atraumatic, conjunctiva  clear, no obvious abnormalities on inspection of external nose and earsNECK: no obvious masses on inspection palpation  LUNGS: clear to auscultation bilaterally, no wheezes, rales or rhonchi, good air movement CV: HRRR, no clubbing cyanosis or  peripheral edema nl cap refill  Has vv le  MS: moves all extremities without noticeable focal  Abnormality right knee arthritis changes but no acute swelling  Neuro groslsly non focal  PSYCH: pleasant and cooperative, no obvious depression or anxiety Lab Results  Component Value Date   WBC 16.8 (H) 03/24/2017   HGB 15.8 (H) 03/24/2017   HCT 45.8 03/24/2017   PLT 287 03/24/2017   GLUCOSE 187 (H) 03/24/2017   CHOL 204 (H) 10/31/2014    TRIG 269.0 (H) 10/31/2014   HDL 32.60 (L) 10/31/2014   LDLDIRECT 110.0 10/31/2014  ALT 32 03/24/2017   AST 30 03/24/2017   NA 136 03/24/2017   K 3.7 03/24/2017   CL 100 (L) 03/24/2017   CREATININE 1.02 (H) 03/24/2017   BUN 26 (H) 03/24/2017   CO2 23 03/24/2017   TSH 1.56 12/02/2016   HGBA1C 6.1 (A) 10/21/2017   BP Readings from Last 3 Encounters:  10/21/17 138/80  03/24/17 (!) 153/67  03/05/17 132/66   Wt Readings from Last 3 Encounters:  10/21/17 169 lb 14.4 oz (77.1 kg)  03/24/17 174 lb 9 oz (79.2 kg)  03/05/17 174 lb 9.6 oz (79.2 kg)     ASSESSMENT AND PLAN:  Discussed the following assessment and plan:  Cataract, unspecified cataract type, unspecified laterality - left to be done   Elevated blood sugar - Plan: POC HgB A1c  Pre-op evaluation - ok for surgery   Essential hypertension  Medication management Doing well  No CI to surgery  .     Will send in clearance form .  BP   controlled and no se  caution if gets low  BP    As we can adjust medication.  As needed  Due for lab monitoring and exam in end January February .    Continue heart healthy eating and  Safe activity as tolerated .  Right back knee problematic  at times but doing well today.     -Patient advised to return or notify health care team  if  new concerns arise.  Patient Instructions  Ok to refill medication today Will send in ok for surgery.   Due for physical and  Blood tests in [place] ,.    Glad you are doing well.     Standley Brooking. Dilia Alemany M.D.

## 2017-10-21 ENCOUNTER — Ambulatory Visit (INDEPENDENT_AMBULATORY_CARE_PROVIDER_SITE_OTHER): Payer: Medicare HMO | Admitting: Internal Medicine

## 2017-10-21 ENCOUNTER — Encounter: Payer: Self-pay | Admitting: Internal Medicine

## 2017-10-21 VITALS — BP 138/80 | HR 65 | Temp 98.4°F | Wt 169.9 lb

## 2017-10-21 DIAGNOSIS — I10 Essential (primary) hypertension: Secondary | ICD-10-CM

## 2017-10-21 DIAGNOSIS — Z01818 Encounter for other preprocedural examination: Secondary | ICD-10-CM

## 2017-10-21 DIAGNOSIS — R739 Hyperglycemia, unspecified: Secondary | ICD-10-CM | POA: Diagnosis not present

## 2017-10-21 DIAGNOSIS — Z79899 Other long term (current) drug therapy: Secondary | ICD-10-CM

## 2017-10-21 DIAGNOSIS — H269 Unspecified cataract: Secondary | ICD-10-CM

## 2017-10-21 LAB — POCT GLYCOSYLATED HEMOGLOBIN (HGB A1C): HEMOGLOBIN A1C: 6.1 % — AB (ref 4.0–5.6)

## 2017-10-21 MED ORDER — LISINOPRIL 40 MG PO TABS: 40.0000 mg | ORAL_TABLET | Freq: Every day | ORAL | 1 refills | Status: DC

## 2017-10-21 MED ORDER — ATENOLOL 50 MG PO TABS: ORAL_TABLET | ORAL | 1 refills | Status: DC

## 2017-10-21 NOTE — Patient Instructions (Addendum)
Ok to refill medication today Will send in ok for surgery.   Due for physical and  Blood tests in [place] ,.    Glad you are doing well.

## 2017-10-27 ENCOUNTER — Encounter: Payer: Self-pay | Admitting: Internal Medicine

## 2017-10-28 ENCOUNTER — Other Ambulatory Visit: Payer: Self-pay | Admitting: Family Medicine

## 2017-10-28 NOTE — Telephone Encounter (Signed)
WP pt

## 2017-10-28 NOTE — Telephone Encounter (Signed)
Sent in electronically .  

## 2017-11-10 ENCOUNTER — Ambulatory Visit: Payer: Medicare HMO

## 2017-11-22 NOTE — Progress Notes (Addendum)
Subjective:   Julie Boone is a 84 y.o. female who presents for Medicare Annual (Subsequent) preventive examination.  Reports health as fair  Hx of mastectomy OV with Dr. 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 Whole grain bread Does not eat as much cheese  Greens and vegetables   Exercise Started walking around the home with her dtr Belongs to curves in [place] do these things anymore   Flexagenics for her knee  Health Maintenance Due  Topic Date Due  . PNA vac Low Risk Adult (1 of 2 - PCV13) 03/26/1991  . INFLUENZA VACCINE  09/30/2017   Declined pneumonia and states she does not believe in these; took them out of the metric Declined flu vaccine  Mammogram 01/2017\has these every year  dexa 01/2010 -2.9 - States she broke her ankle years ago  Never took medicine    Eating better since dtr had MI; Discussed elevated A1c but "is not worried" and could have been related to prednisone for sciatic     Objective:     Vitals: BP 138/80   Pulse 81   Ht 5\' 3"  (1.6 m)   Wt 166 lb 7 oz (75.5 kg)   SpO2 94%   BMI 29.48 kg/m   Body mass index is 29.48 kg/m.  Advanced Directives 11/23/2017 03/24/2017 10/08/2016 11/18/2015 10/10/2014 02/18/2014 08/29/2013  Does Patient Have a Medical Advance Directive? Yes No;Yes No No No Yes Patient has advance directive, copy not in chart  Type of Advance Directive - [place];Living will - - - - Living will;Healthcare Power of Attorney  Does patient want to make changes to medical advance directive? - No - Patient declined - - - - No  change requested  Copy of Healthcare Power of Attorney in Chart? - No - copy requested - - - No - copy requested -  Would patient like information on creating a medical advance directive? - No - Patient declined - No - patient declined information - - -    Tobacco Social History   Tobacco Use  Smoking Status Never Smoker  Smokeless Tobacco Never Used     Counseling given: Yes   Clinical Intake:   Past Medical History:  Diagnosis Date  . Acute sinusitis with symptoms greater than 10 days 06/23/2013  . Arthritis   . Breast cancer ([place])    mastectomy 3   neg ln   . HOH (hard of hearing)   . HTN (hypertension)   . Wears glasses   . Wears hearing aid    both ears   Past Surgical History:  Procedure Laterality Date  . APPENDECTOMY  2006  . BREAST BIOPSY Left 2012  . COLONOSCOPY    . HERNIA REPAIR    . INSERTION OF MESH N/A 09/05/2013   Procedure: INSERTION OF MESH;  Surgeon: Joyice Faster. Cornett, MD;  Location: [facility];  Service: General;  Laterality: N/A;  . MASTECTOMY Left 2013  . TONSILLECTOMY AND ADENOIDECTOMY  Childhood  . UMBILICAL HERNIA REPAIR N/A 09/05/2013   Procedure: HERNIA REPAIR UMBILICAL ADULT WITH MESH;  Surgeon: Joyice Faster. Cornett, MD;  Location: [facility];  Service: General;  Laterality: N/A;   Family History  Problem Relation Age of Onset  . Hypertension Mother   . Stroke Mother   . Heart failure Mother   . Cancer Sister        lung   . Colon cancer Father    Social History   Socioeconomic History  . Marital status: Widowed    Spouse name: Not on file  . Number of children: Not on file  . Years of education: Not on file  . Highest education level: Not on file  Occupational History  . Not on file  Social Needs  . Financial resource strain: Not on file  . Food insecurity:    Worry: Not on file    Inability: Not on file  . Transportation needs:    Medical: Not on file    Non-medical: Not on file  Tobacco Use    . Smoking status: Never Smoker  . Smokeless tobacco: Never Used  Substance and Sexual Activity  . Alcohol use: Yes    Alcohol/week: 0.0 standard drinks    Comment: 1 time a month. has a sip ever now and then   . Drug use: No  . Sexual activity: Not on file  Lifestyle  . Physical activity:    Days per week: Not on file    Minutes per session: Not on file  . Stress: Not on file  Relationships  . Social connections:    Talks on phone: Not on file    Gets together: Not on file    Attends religious service: Not on file    Active member of club or organization: Not on file    Attends meetings of clubs or organizations: Not on file    Relationship status: Not on file  Other Topics Concern  . Not on file  Social History Narrative   Usually receives 5 hours of sleep per night   2people living in the home   [place]uppy  [place] and [place]   From [place] moved from [place]  Adopted daughter nearby.      Neg ets  Wine with meals  No tob rd.   G0P0   Married husband [place] background  H[place]elf [place] descent.   Husband passed CHF and prostate cancer 2017   Now living with daughter          Outpatient Encounter Medications as of 11/23/2017  Medication Sig  . atenolol (TENORMIN) 50 MG tablet TAKE 1 AND 1/2 TABLETS EVERY DAY  . bisacodyl (DULCOLAX) 10 MG suppository Place 1 suppository (10 mg total) rectally as needed for moderate constipation.  [name] lisinopril (PRINIVIL,ZESTRIL) 40 MG tablet Take 1 tablet (40 mg total) by mouth daily.  . meclizine (ANTIVERT) 25 MG tablet Take 1 tablet (25 mg total) by mouth every 6 (six) hours as needed for dizziness.  . calcium carbonate (OS-CAL - DOSED IN MG OF ELEMENTAL CALCIUM) 1250 (500 Ca) MG tablet Take 1 tablet by mouth daily with breakfast.  . Coenzyme Q10 (CO Q 10 PO) Take 1 tablet by mouth daily.   [name] doxycycline (VIBRAMYCIN) 100 MG capsule Take 1 capsule (100 mg total) by mouth 2 (two) times daily. (Patient not taking: Reported on 10/21/2017)  .  guaiFENesin (MUCINEX) 600 MG 12 hr tablet Take 600-1,200 mg by mouth 2 (two)  times daily.  [name] guaifenesin (ROBITUSSIN) 100 MG/5ML syrup Take 100 mg by mouth 3 (three) times daily as needed for cough.  . ondansetron (ZOFRAN ODT) 8 MG disintegrating tablet Take 1 tablet (8 mg total) by mouth every 8 (eight) hours as needed for nausea. (Patient not taking: Reported on 10/21/2017)  . polyethylene glycol (MIRALAX / GLYCOLAX) packet Take 17 g by mouth daily. (Patient not taking: Reported on 10/21/2017)  . predniSONE (DELTASONE) 20 MG tablet 2 tabs x 3 days, 1 tab x 3 days, 1/2 tab x 3 days, 1/2 tab M,W,F x 2 weeks (Patient not taking: Reported on 03/05/2017)  . promethazine (PHENERGAN) 25 MG tablet Take 25 mg by mouth every 6 (six) hours as needed for nausea or vomiting.  . ranitidine (ZANTAC) 150 MG tablet TAKE 1 TABLET BY MOUTH TWICE A DAY (Patient not taking: Reported on 10/21/2017)  . traMADol (ULTRAM) 50 MG tablet TAKE 1 TABLET BY MOUTH TWICE A DAY AS needed FOR PAIN (Patient not taking: Reported on 11/23/2017)   No facility-administered encounter medications on file as of 11/23/2017.     Activities of Daily Living In your present state of health, do you have any difficulty performing the following activities: 11/23/2017  Hearing? Y  Vision? N  Difficulty concentrating or making decisions? N  Walking or climbing stairs? Y  Dressing or bathing? N  Doing errands, shopping? N  Preparing Food and eating ? N  Using the Toilet? N  In the past six months, have you accidently leaked urine? N  Do you have problems with loss of bowel control? (No Data)  Comment sometimes constipated  Managing your Medications? N  Managing your Finances? N  Housekeeping or managing your Housekeeping? N  Some recent data might be hidden    Patient Care Team: Panosh, Standley Brooking, MD as PCP - General (Internal Medicine) Erroll Luna, MD as Consulting Physician (General Surgery) Inocencio Homes, DPM as Consulting Physician  (Podiatry)    Assessment:   This is a routine wellness examination for [place].  Exercise Activities and Dietary recommendations    Goals    . 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 No No No No     Depression Screen PHQ 2/9 Scores 11/23/2017 12/02/2016 10/31/2014 01/22/2014  PHQ - 2 Score 1 0 0 0     Cognitive Function   Ad8 score reviewed for issues:  Issues making decisions:  Less interest in hobbies / activities:  Repeats questions, stories (family complaining):  Trouble using ordinary gadgets (microwave, computer, phone):  Forgets the month or year:   Mismanaging finances:   Remembering appts:  Daily problems with thinking and/or memory: Ad8 score is=0 Knits  scrabble together Still has a boat       There is no immunization history on file for this patient.   Screening Tests Health Maintenance  Topic Date Due  . PNA vac Low Risk Adult (1 of 2 - PCV13) 03/26/1991  . INFLUENZA VACCINE  09/30/2017  . TETANUS/TDAP  10/22/2018 (Originally 03/25/1943)  . MAMMOGRAM  02/15/2018  . DEXA SCAN  Completed       Plan:      PCP Notes   Health Maintenance  Declined pneumonia and states she does not believe in these; took them  out of the metric Declined flu vaccine  Mammogram 01/2017\has these every year  dexa 01/2010 -2.9 - States she broke her ankle years ago  Never took medicine    Eating better since dtr had MI; Discussed elevated A1c but "is not worried" and could have been related to prednisone for sciatic   Abnormal Screens  Hearing and is considering new hearing aids  Referrals  none  Patient concerns; Sciatica has started hurting again 8/22 Would like prednisone taper; took one today  Also tramadol needs to be refilled (not totally out)  (to have cataract  surgery tomorrow )  Nurse Concerns; As noted  Next PCP apt 03/15/2018      I have personally reviewed and noted the following in the patient's chart:   . Medical and social history . Use of alcohol, tobacco or illicit drugs  . Current medications and supplements . Functional ability and status . Nutritional status . Physical activity . Advanced directives . List of other physicians . Hospitalizations, surgeries, and ER visits in previous 12 months . Vitals . Screenings to include cognitive, depression, and falls . Referrals and appointments  In addition, I have reviewed and discussed with patient certain preventive protocols, quality metrics, and best practice recommendations. A written personalized care plan for preventive services as well as general preventive health recommendations were provided to patient.     RPRXY,VOPFY, RN  11/23/2017   Above noted reviewed and agree. [name], MD  Can do opred and trnadol but want her to  Get the spine x ray that I dont see in the record ordered in December

## 2017-11-23 ENCOUNTER — Ambulatory Visit (INDEPENDENT_AMBULATORY_CARE_PROVIDER_SITE_OTHER): Payer: Medicare HMO

## 2017-11-23 ENCOUNTER — Telehealth: Payer: Self-pay

## 2017-11-23 VITALS — BP 138/80 | HR 81 | Ht 63.0 in | Wt 166.4 lb

## 2017-11-23 DIAGNOSIS — Z0000 Encounter for general adult medical examination without abnormal findings: Secondary | ICD-10-CM | POA: Diagnosis not present

## 2017-11-23 MED ORDER — PREDNISONE 20 MG PO TABS: ORAL_TABLET | ORAL | 1 refills | Status: DC

## 2017-11-23 MED ORDER — TRAMADOL HCL 50 MG PO TABS: ORAL_TABLET | ORAL | 0 refills | Status: DC

## 2017-11-23 NOTE — Telephone Encounter (Signed)
In for AWV 09/24 States she has been walking with her dtr and developed sciatica  Had to take a prednisone last night form the last taper in January.  Also has a few tramadol left but would like this refilled with Prednisone taper.  (she did not take all of the last taper as she started feeling better)  Cataract surgery tomorrow but told her she can take her tramadol tonight and one prednisone)   [facility] [place] on [address]   Please advise

## 2017-11-23 NOTE — Patient Instructions (Addendum)
Julie Boone , Thank you for taking time to come for your Medicare Wellness Visit. I appreciate your ongoing commitment to your health goals. Please review the following plan we discussed and let me know if I can assist you in the future.   Dr. Regis Bill will review for repeat prednisone and Tramadol. May decide to send you to a specialist.  We will let you know if ordered   Will ask Dr. Regis Bill about prednisone and tramadol   Was walking with dtr and may try to walk again   Shingrix is a vaccine for the prevention of Shingles in Adults 50 and older.  If you are on Medicare, the shingrix is covered under your Part D plan, so you will take both of the vaccines in the series at your pharmacy. Please check with your benefits regarding applicable copays or out of pocket expenses.  The Shingrix is given in 2 vaccines approx 8 weeks apart. You must receive the 2nd dose prior to 6 months from receipt of the first. Please have the pharmacist print out you Immunization  dates for our office records     These are the goals we discussed: Goals    . Patient Stated     To be grateful every day and be able to enjoy your life Keeps knitting baby caps for Julie Boone        This is a list of the screening recommended for you and due dates:  Health Maintenance  Topic Date Due  . Pneumonia vaccines (1 of 2 - PCV13) 03/26/1991  . Flu Shot  09/30/2017  . Tetanus Vaccine  10/22/2018*  . Mammogram  02/15/2018  . DEXA scan (bone density measurement)  Completed  *Topic was postponed. The date shown is not the original due date.   Fall Prevention in the Home Falls can cause injuries and can affect people from all age groups. There are many simple things that you can do to make your home safe and to help prevent falls. What can I do on the outside of my home?  Regularly repair the edges of walkways and driveways and fix any cracks.  Remove high doorway thresholds.  Trim any shrubbery on the main path  into your home.  Use bright outdoor lighting.  Clear walkways of debris and clutter, including tools and rocks.  Regularly check that handrails are securely fastened and in good repair. Both sides of any steps should have handrails.  Install guardrails along the edges of any raised decks or porches.  Have leaves, snow, and ice cleared regularly.  Use sand or salt on walkways during winter months.  In the garage, clean up any spills right away, including grease or oil spills. What can I do in the bathroom?  Use night lights.  Install grab bars by the toilet and in the tub and shower. Do not use towel bars as grab bars.  Use non-skid mats or decals on the floor of the tub or shower.  If you need to sit down while you are in the shower, use a plastic, non-slip stool.  Keep the floor dry. Immediately clean up any water that spills on the floor.  Remove soap buildup in the tub or shower on a regular basis.  Attach bath mats securely with double-sided non-slip rug tape.  Remove throw rugs and other tripping hazards from the floor. What can I do in the bedroom?  Use night lights.  Make sure that a bedside light is easy to  reach.  Do not use oversized bedding that drapes onto the floor.  Have a firm chair that has side arms to use for getting dressed.  Remove throw rugs and other tripping hazards from the floor. What can I do in the kitchen?  Clean up any spills right away.  Avoid walking on wet floors.  Place frequently used items in easy-to-reach places.  If you need to reach for something above you, use a sturdy step stool that has a grab bar.  Keep electrical cables out of the way.  Do not use floor polish or wax that makes floors slippery. If you have to use wax, make sure that it is non-skid floor wax.  Remove throw rugs and other tripping hazards from the floor. What can I do in the stairways?  Do not leave any items on the stairs.  Make sure that there  are handrails on both sides of the stairs. Fix handrails that are broken or loose. Make sure that handrails are as long as the stairways.  Check any carpeting to make sure that it is firmly attached to the stairs. Fix any carpet that is loose or worn.  Avoid having throw rugs at the top or bottom of stairways, or secure the rugs with carpet tape to prevent them from moving.  Make sure that you have a light switch at the top of the stairs and the bottom of the stairs. If you do not have them, have them installed. What are some other fall prevention tips?  Wear closed-toe shoes that fit well and support your feet. Wear shoes that have rubber soles or low heels.  When you use a stepladder, make sure that it is completely opened and that the sides are firmly locked. Have someone hold the ladder while you are using it. Do not climb a closed stepladder.  Add color or contrast paint or tape to grab bars and handrails in your home. Place contrasting color strips on the first and last steps.  Use mobility aids as needed, such as canes, walkers, scooters, and crutches.  Turn on lights if it is dark. Replace any light bulbs that burn out.  Set up furniture so that there are clear paths. Keep the furniture in the same spot.  Fix any uneven floor surfaces.  Choose a carpet design that does not hide the edge of steps of a stairway.  Be aware of any and all pets.  Review your medicines with your healthcare provider. Some medicines can cause dizziness or changes in blood pressure, which increase your risk of falling. Talk with your health care provider about other ways that you can decrease your risk of falls. This may include working with a physical therapist or trainer to improve your strength, balance, and endurance. This information is not intended to replace advice given to you by your health care provider. Make sure you discuss any questions you have with your health care provider. Document  Released: 02/06/2002 Document Revised: 07/16/2015 Document Reviewed: 03/23/2014 Elsevier Interactive Patient Education  2018 [employer] Maintenance for Postmenopausal Women Menopause is a normal process in which your reproductive ability comes to an end. This process happens gradually over a span of months to years, usually between the ages of 65 and 67. Menopause is complete when you have missed 12 consecutive menstrual periods. It is important to talk with your health care provider about some of the most common conditions that affect postmenopausal women, such as heart disease, cancer,  and bone loss (osteoporosis). Adopting a healthy lifestyle and getting preventive care can help to promote your health and wellness. Those actions can also lower your chances of developing some of these common conditions. What should I know about menopause? During menopause, you may experience a number of symptoms, such as:  Moderate-to-severe hot flashes.  Night sweats.  Decrease in sex drive.  Mood swings.  Headaches.  Tiredness.  Irritability.  Memory problems.  Insomnia.  Choosing to treat or not to treat menopausal changes is an individual decision that you make with your health care provider. What should I know about hormone replacement therapy and supplements? Hormone therapy products are effective for treating symptoms that are associated with menopause, such as hot flashes and night sweats. Hormone replacement carries certain risks, especially as you become older. If you are thinking about using estrogen or estrogen with progestin treatments, discuss the benefits and risks with your health care provider. What should I know about heart disease and stroke? Heart disease, heart attack, and stroke become more likely as you age. This may be due, in part, to the hormonal changes that your body experiences during menopause. These can affect how your body processes dietary fats,  triglycerides, and cholesterol. Heart attack and stroke are both medical emergencies. There are many things that you can do to help prevent heart disease and stroke:  Have your blood pressure checked at least every 1-2 years. High blood pressure causes heart disease and increases the risk of stroke.  If you are 78-50 years old, ask your health care provider if you should take aspirin to prevent a heart attack or a stroke.  Do not use any tobacco products, including cigarettes, chewing tobacco, or electronic cigarettes. If you need help quitting, ask your health care provider.  It is important to eat a healthy diet and maintain a healthy weight. ? Be sure to include plenty of vegetables, fruits, low-fat dairy products, and lean protein. ? Avoid eating foods that are high in solid fats, added sugars, or salt (sodium).  Get regular exercise. This is one of the most important things that you can do for your health. ? Try to exercise for at least 150 minutes each week. The type of exercise that you do should increase your heart rate and make you sweat. This is known as moderate-intensity exercise. ? Try to do strengthening exercises at least twice each week. Do these in addition to the moderate-intensity exercise.  Know your numbers.Ask your health care provider to check your cholesterol and your blood glucose. Continue to have your blood tested as directed by your health care provider.  What should I know about cancer screening? There are several types of cancer. Take the following steps to reduce your risk and to catch any cancer development as early as possible. Breast Cancer  Practice breast self-awareness. ? This means understanding how your breasts normally appear and feel. ? It also means doing regular breast self-exams. Let your health care provider know about any changes, no matter how small.  If you are 8 or older, have a clinician do a breast exam (clinical breast exam or CBE)  every year. Depending on your age, family history, and medical history, it may be recommended that you also have a yearly breast X-ray (mammogram).  If you have a family history of breast cancer, talk with your health care provider about genetic screening.  If you are at high risk for breast cancer, talk with your health care provider about  having an MRI and a mammogram every year.  Breast cancer (BRCA) gene test is recommended for women who have family members with BRCA-related cancers. Results of the assessment will determine the need for genetic counseling and BRCA1 and for BRCA2 testing. BRCA-related cancers include these types: ? Breast. This occurs in males or females. ? Ovarian. ? Tubal. This may also be called fallopian tube cancer. ? Cancer of the abdominal or pelvic lining (peritoneal cancer). ? Prostate. ? Pancreatic.  Cervical, Uterine, and Ovarian Cancer Your health care provider may recommend that you be screened regularly for cancer of the pelvic organs. These include your ovaries, uterus, and vagina. This screening involves a pelvic exam, which includes checking for microscopic changes to the surface of your cervix (Pap test).  For women ages 21-65, health care providers may recommend a pelvic exam and a Pap test every three years. For women ages 49-65, they may recommend the Pap test and pelvic exam, combined with testing for human papilloma virus (HPV), every five years. Some types of HPV increase your risk of cervical cancer. Testing for HPV may also be done on women of any age who have unclear Pap test results.  Other health care providers may not recommend any screening for nonpregnant women who are considered low risk for pelvic cancer and have no symptoms. Ask your health care provider if a screening pelvic exam is right for you.  If you have had past treatment for cervical cancer or a condition that could lead to cancer, you need Pap tests and screening for cancer for at  least 20 years after your treatment. If Pap tests have been discontinued for you, your risk factors (such as having a new sexual partner) need to be reassessed to determine if you should start having screenings again. Some women have medical problems that increase the chance of getting cervical cancer. In these cases, your health care provider may recommend that you have screening and Pap tests more often.  If you have a family history of uterine cancer or ovarian cancer, talk with your health care provider about genetic screening.  If you have vaginal bleeding after reaching menopause, tell your health care provider.  There are currently no reliable tests available to screen for ovarian cancer.  Lung Cancer Lung cancer screening is recommended for adults 33-19 years old who are at high risk for lung cancer because of a history of smoking. A yearly low-dose CT scan of the lungs is recommended if you:  Currently smoke.  Have a history of at least 30 pack-years of smoking and you currently smoke or have quit within the past 15 years. A pack-year is smoking an average of one pack of cigarettes per day for one year.  Yearly screening should:  Continue until it has been 15 years since you quit.  Stop if you develop a health problem that would prevent you from having lung cancer treatment.  Colorectal Cancer  This type of cancer can be detected and can often be prevented.  Routine colorectal cancer screening usually begins at age 89 and continues through age 74.  If you have risk factors for colon cancer, your health care provider may recommend that you be screened at an earlier age.  If you have a family history of colorectal cancer, talk with your health care provider about genetic screening.  Your health care provider may also recommend using home test kits to check for hidden blood in your stool.  A small camera at the  end of a tube can be used to examine your colon directly  (sigmoidoscopy or colonoscopy). This is done to check for the earliest forms of colorectal cancer.  Direct examination of the colon should be repeated every 5-10 years until age 65. However, if early forms of precancerous polyps or small growths are found or if you have a family history or genetic risk for colorectal cancer, you may need to be screened more often.  Skin Cancer  Check your skin from head to toe regularly.  Monitor any moles. Be sure to tell your health care provider: ? About any new moles or changes in moles, especially if there is a change in a mole's shape or color. ? If you have a mole that is larger than the size of a pencil eraser.  If any of your family members has a history of skin cancer, especially at a young age, talk with your health care provider about genetic screening.  Always use sunscreen. Apply sunscreen liberally and repeatedly throughout the day.  Whenever you are outside, protect yourself by wearing long sleeves, pants, a wide-brimmed hat, and sunglasses.  What should I know about osteoporosis? Osteoporosis is a condition in which bone destruction happens more quickly than new bone creation. After menopause, you may be at an increased risk for osteoporosis. To help prevent osteoporosis or the bone fractures that can happen because of osteoporosis, the following is recommended:  If you are 27-25 years old, get at least 1,000 mg of calcium and at least 600 mg of vitamin D per day.  If you are older than age 53 but younger than age 43, get at least 1,200 mg of calcium and at least 600 mg of vitamin D per day.  If you are older than age 72, get at least 1,200 mg of calcium and at least 800 mg of vitamin D per day.  Smoking and excessive alcohol intake increase the risk of osteoporosis. Eat foods that are rich in calcium and vitamin D, and do weight-bearing exercises several times each week as directed by your health care provider. What should I know about  how menopause affects my mental health? Depression may occur at any age, but it is more common as you become older. Common symptoms of depression include:  Low or sad mood.  Changes in sleep patterns.  Changes in appetite or eating patterns.  Feeling an overall lack of motivation or enjoyment of activities that you previously enjoyed.  Frequent crying spells.  Talk with your health care provider if you think that you are experiencing depression. What should I know about immunizations? It is important that you get and maintain your immunizations. These include:  Tetanus, diphtheria, and pertussis (Tdap) booster vaccine.  Influenza every year before the flu season begins.  Pneumonia vaccine.  Shingles vaccine.  Your health care provider may also recommend other immunizations. This information is not intended to replace advice given to you by your health care provider. Make sure you discuss any questions you have with your health care provider. Document Released: 04/10/2005 Document Revised: 09/06/2015 Document Reviewed: 11/20/2014 Elsevier Interactive Patient Education  2018 [employer].

## 2017-11-23 NOTE — Telephone Encounter (Signed)
I refilled this refill but will need    To get the back spine  x ray  That seeming ly was never done?   And Follow up  OV if keeps recurring   Please  Get  The x ray for the lumbar spine that was ordered by dr todd previously  Can replace the order to our site location  [facility]  makes it easier to obtain.

## 2017-11-24 DIAGNOSIS — H25812 Combined forms of age-related cataract, left eye: Secondary | ICD-10-CM | POA: Diagnosis not present

## 2017-11-24 DIAGNOSIS — H2512 Age-related nuclear cataract, left eye: Secondary | ICD-10-CM | POA: Diagnosis not present

## 2017-11-24 NOTE — Telephone Encounter (Signed)
Call to fup on spine xray Left VM on home phone as cell was busy. Requested she come over to the office to xray her spine per Dr. Regis Bill. Left my direct line for call back   Megan available except from 12 to 1 for xray   Wynetta Fines RN

## 2017-11-25 NOTE — Telephone Encounter (Signed)
FYI

## 2017-11-25 NOTE — Telephone Encounter (Signed)
Call to Pam[facility] Hurry this am Ms. Julie Boone had her cataract removed yesterday am. Stated she was doing well but had pressure build up which the doctor relieved this am.  Reviewed need for spinal xray; order is in and can be done at [facility].  Agreed to bring her in when she is feeling better from her eye surgery but most likely next week.  Wynetta Fines RN

## 2017-12-14 NOTE — Telephone Encounter (Signed)
Called and spoke to [place], Ms. Marro's dtr. Requested she bring her in for the spinal xray as Dr. Sherren Mocha has ordered. Dr. Regis Bill agrees she needs to have this In lieu of the pain she is in   The dtr confirmed she would bring her. Wynetta Fines RN

## 2017-12-16 ENCOUNTER — Ambulatory Visit (INDEPENDENT_AMBULATORY_CARE_PROVIDER_SITE_OTHER): Payer: Medicare HMO

## 2017-12-16 DIAGNOSIS — M5116 Intervertebral disc disorders with radiculopathy, lumbar region: Secondary | ICD-10-CM | POA: Diagnosis not present

## 2017-12-16 DIAGNOSIS — M545 Low back pain: Secondary | ICD-10-CM | POA: Diagnosis not present

## 2018-02-01 DIAGNOSIS — H6123 Impacted cerumen, bilateral: Secondary | ICD-10-CM | POA: Diagnosis not present

## 2018-03-14 NOTE — Progress Notes (Deleted)
No chief complaint on file.   HPI: Julie Boone 85 y.o. comes in today for Preventive Medicare exam/ wellness visit .Since last visit.  Health Maintenance  Topic Date Due  . MAMMOGRAM  02/15/2018  . INFLUENZA VACCINE  10/01/2018 (Originally 09/30/2017)  . TETANUS/TDAP  10/22/2018 (Originally 03/25/1943)  . DEXA SCAN  Completed   Health Maintenance Review LIFESTYLE:  Exercise:   Tobacco/ETS: Alcohol:  Sugar beverages: Sleep: Drug use: no HH:      Hearing:   Vision:  No limitations at present . Last eye check UTD  Safety:  Has smoke detector and wears seat belts.  No firearms. No excess sun exposure. Sees dentist regularly.  Falls:   Advance directive :  Reviewed  Has one.  Memory: Felt to be good  , no concern from her or her family.  Depression: No anhedonia unusual crying or depressive symptoms  Nutrition: Eats well balanced diet; adequate calcium and vitamin D. No swallowing chewing problems.  Injury: no major injuries in the last six months.  Other healthcare providers:  Reviewed today .   Preventive parameters: up-to-date  Reviewed   ADLS:   There are no problems or need for assistance  driving, feeding, obtaining food, dressing, toileting and bathing, managing money using phone. She is independent. EXERCISE/ HABITS  Per week   No tobacco    eto  ROS:  GEN/ HEENT: No fever, significant weight changes sweats headaches vision problems hearing changes, CV/ PULM; No chest pain shortness of breath cough, syncope,edema  change in exercise tolerance. GI /GU: No adominal pain, vomiting, change in bowel habits. No blood in the stool. No significant GU symptoms. SKIN/HEME: ,no acute skin rashes suspicious lesions or bleeding. No lymphadenopathy, nodules, masses.  NEURO/ PSYCH:  No neurologic signs such as weakness numbness. No depression anxiety. IMM/ Allergy: No unusual infections.  Allergy .   REST of 12 system review negative except as per  HPI   Past Medical History:  Diagnosis Date  . Acute sinusitis with symptoms greater than 10 days 06/23/2013  . Arthritis   . Breast cancer ([place])    mastectomy 3   neg ln   . HOH (hard of hearing)   . HTN (hypertension)   . Wears glasses   . Wears hearing aid    both ears    Family History  Problem Relation Age of Onset  . Hypertension Mother   . Stroke Mother   . Heart failure Mother   . Cancer Sister        lung   . Colon cancer Father     Social History   Socioeconomic History  . Marital status: Widowed    Spouse name: Not on file  . Number of children: Not on file  . Years of education: Not on file  . Highest education level: Not on file  Occupational History  . Not on file  Social Needs  . Financial resource strain: Not on file  . Food insecurity:    Worry: Not on file    Inability: Not on file  . Transportation needs:    Medical: Not on file    Non-medical: Not on file  Tobacco Use  . Smoking status: Never Smoker  . Smokeless tobacco: Never Used  Substance and Sexual Activity  . Alcohol use: Yes    Alcohol/week: 0.0 standard drinks    Comment: 1 time a month. has a sip ever now and then   . Drug use: No  .  Sexual activity: Not on file  Lifestyle  . Physical activity:    Days per week: Not on file    Minutes per session: Not on file  . Stress: Not on file  Relationships  . Social connections:    Talks on phone: Not on file    Gets together: Not on file    Attends religious service: Not on file    Active member of club or organization: Not on file    Attends meetings of clubs or organizations: Not on file    Relationship status: Not on file  Other Topics Concern  . Not on file  Social History Narrative   Usually receives 5 hours of sleep per night   2people living in the home   Puppy  smakk [place]ie and [place]   From [place] moved from [place]  Adopted daughter nearby.      Neg ets  Wine with meals  No tob rd.   G0P0   Married husband [place]  background  H[place]elf [place] descent.   Husband passed CHF and prostate cancer 2017   Now living with daughter          Outpatient Encounter Medications as of 03/15/2018  Medication Sig  . atenolol (TENORMIN) 50 MG tablet TAKE 1 AND 1/2 TABLETS EVERY DAY  . bisacodyl (DULCOLAX) 10 MG suppository Place 1 suppository (10 mg total) rectally as needed for moderate constipation.  . calcium carbonate (OS-CAL - DOSED IN MG OF ELEMENTAL CALCIUM) 1250 (500 Ca) MG tablet Take 1 tablet by mouth daily with breakfast.  . Coenzyme Q10 (CO Q 10 PO) Take 1 tablet by mouth daily.   [name] doxycycline (VIBRAMYCIN) 100 MG capsule Take 1 capsule (100 mg total) by mouth 2 (two) times daily. (Patient not taking: Reported on 10/21/2017)  . guaiFENesin (MUCINEX) 600 MG 12 hr tablet Take 600-1,200 mg by mouth 2 (two) times daily.  [name] guaifenesin (ROBITUSSIN) 100 MG/5ML syrup Take 100 mg by mouth 3 (three) times daily as needed for cough.  [name] lisinopril (PRINIVIL,ZESTRIL) 40 MG tablet Take 1 tablet (40 mg total) by mouth daily.  . meclizine (ANTIVERT) 25 MG tablet Take 1 tablet (25 mg total) by mouth every 6 (six) hours as needed for dizziness.  . ondansetron (ZOFRAN ODT) 8 MG disintegrating tablet Take 1 tablet (8 mg total) by mouth every 8 (eight) hours as needed for nausea. (Patient not taking: Reported on 10/21/2017)  . polyethylene glycol (MIRALAX / GLYCOLAX) packet Take 17 g by mouth daily. (Patient not taking: Reported on 10/21/2017)  . predniSONE (DELTASONE) 20 MG tablet 2 tabs x 3 days, 1 tab x 3 days, 1/2 tab x 3 days, 1/2 tab M,W,F x 2 weeks  . promethazine (PHENERGAN) 25 MG tablet Take 25 mg by mouth every 6 (six) hours as needed for nausea or vomiting.  . ranitidine (ZANTAC) 150 MG tablet TAKE 1 TABLET BY MOUTH TWICE A DAY (Patient not taking: Reported on 10/21/2017)  . traMADol (ULTRAM) 50 MG tablet TAKE 1 TABLET BY MOUTH TWICE A DAY AS needed FOR PAIN   No facility-administered encounter medications on file as of  03/15/2018.     EXAM:  There were no vitals taken for this visit.  There is no height or weight on file to calculate BMI.  Physical Exam: Vital signs reviewed [id number]:[id number] is a well-developed well-nourished alert cooperative   who appears stated age in no acute distress.  HEENT: normocephalic atraumatic , Eyes: PERRL EOM's full, conjunctiva clear, Nares: paten,t  no deformity discharge or tenderness., Ears: no deformity EAC's clear TMs with normal landmarks. Mouth: clear OP, no lesions, edema.  Moist mucous membranes. Dentition in adequate repair. NECK: supple without masses, thyromegaly or bruits. CHEST/PULM:  Clear to auscultation and percussion breath sounds equal no wheeze , rales or rhonchi. No chest wall deformities or tenderness. CV: PMI is nondisplaced, S1 S2 no gallops, murmurs, rubs. Peripheral pulses are full without delay.No JVD .  ABDOMEN: Bowel sounds normal nontender  No guard or rebound, no hepato splenomegal no CVA tenderness.   Extremtities:  No clubbing cyanosis or edema, no acute joint swelling or redness no focal atrophy NEURO:  Oriented x3, cranial nerves 3-12 appear to be intact, no obvious focal weakness,gait within normal limits no abnormal reflexes or asymmetrical SKIN: No acute rashes normal turgor, color, no bruising or petechiae. PSYCH: Oriented, good eye contact, no obvious depression anxiety, cognition and judgment appear normal. LN: no cervical axillary inguinal adenopathy No noted deficits in memory, attention, and speech.   Lab Results  Component Value Date   WBC 16.8 (H) 03/24/2017   HGB 15.8 (H) 03/24/2017   HCT 45.8 03/24/2017   PLT 287 03/24/2017   GLUCOSE 187 (H) 03/24/2017   CHOL 204 (H) 10/31/2014   TRIG 269.0 (H) 10/31/2014   HDL 32.60 (L) 10/31/2014   LDLDIRECT 110.0 10/31/2014   ALT 32 03/24/2017   AST 30 03/24/2017   NA 136 03/24/2017   K 3.7 03/24/2017   CL 100 (L) 03/24/2017   CREATININE 1.02 (H) 03/24/2017   BUN 26 (H) 03/24/2017    CO2 23 03/24/2017   TSH 1.56 12/02/2016   HGBA1C 6.1 (A) 10/21/2017    ASSESSMENT AND PLAN:  Discussed the following assessment and plan:  Essential hypertension  Wears hearing aid  Elevated blood sugar  Medication management  Advanced age  Osteoarthritis of knee, unspecified laterality, unspecified osteoarthritis type a1c cbc bmp Patient Care Team: Panosh, Standley Brooking, MD as PCP - General (Internal Medicine) Erroll Luna, MD as Consulting Physician (General Surgery) Inocencio Homes, DPM as Consulting Physician (Podiatry)  There are no Patient Instructions on file for this visit.  Standley Brooking. Panosh M.D.

## 2018-03-15 ENCOUNTER — Encounter: Payer: Medicare HMO | Admitting: Internal Medicine

## 2018-03-15 DIAGNOSIS — Z0289 Encounter for other administrative examinations: Secondary | ICD-10-CM

## 2018-03-29 ENCOUNTER — Telehealth: Payer: Self-pay | Admitting: Internal Medicine

## 2018-03-29 NOTE — Telephone Encounter (Signed)
Copied from [place] [id number]. Topic: Quick Communication - Rx Refill/Question >> Mar 29, 2018 11:31 AM Margot Ables wrote: Medication: lisinopril (PRINIVIL,ZESTRIL) 40 MG tablet 2-3 days left & atenolol (TENORMIN) 50 MG tablet 1 wk on left- pt has changed insurance to [place] system would not let me load the insurance (verification down) - ID # [id number]  Has the patient contacted their pharmacy? Yes - told new RXs are needed Preferred Pharmacy (with phone number or street name): [facility] [phone] [address]eral[address], [place] [facility] [address] [place] (Phone) [phone] (Fax)

## 2018-03-30 ENCOUNTER — Encounter: Payer: Self-pay | Admitting: Internal Medicine

## 2018-03-30 ENCOUNTER — Ambulatory Visit: Payer: Self-pay | Admitting: *Deleted

## 2018-03-30 ENCOUNTER — Ambulatory Visit (INDEPENDENT_AMBULATORY_CARE_PROVIDER_SITE_OTHER): Payer: Medicare Other

## 2018-03-30 ENCOUNTER — Ambulatory Visit: Payer: Medicare Other | Admitting: Internal Medicine

## 2018-03-30 VITALS — BP 124/62 | HR 76 | Temp 99.0°F | Wt 168.0 lb

## 2018-03-30 DIAGNOSIS — R071 Chest pain on breathing: Secondary | ICD-10-CM | POA: Diagnosis not present

## 2018-03-30 DIAGNOSIS — R61 Generalized hyperhidrosis: Secondary | ICD-10-CM

## 2018-03-30 DIAGNOSIS — R079 Chest pain, unspecified: Secondary | ICD-10-CM | POA: Diagnosis not present

## 2018-03-30 DIAGNOSIS — J90 Pleural effusion, not elsewhere classified: Secondary | ICD-10-CM | POA: Diagnosis not present

## 2018-03-30 LAB — POC URINALSYSI DIPSTICK (AUTOMATED)
Bilirubin, UA: NEGATIVE
Blood, UA: NEGATIVE
Glucose, UA: NEGATIVE
KETONES UA: NEGATIVE
Leukocytes, UA: NEGATIVE
Nitrite, UA: NEGATIVE
Protein, UA: NEGATIVE
Spec Grav, UA: >=1.03 — AB (ref 1.010–1.025)
Urobilinogen, UA: 0.2 E.U./dL
pH, UA: 6 (ref 5.0–8.0)

## 2018-03-30 MED ORDER — AZITHROMYCIN 250 MG PO TABS: ORAL_TABLET | ORAL | 0 refills | Status: DC

## 2018-03-30 NOTE — Progress Notes (Signed)
Chief Complaint  Patient presents with  . Chest Pain    when breathing started last night." feels like someone punched her" has pain, aroudnribs and back. pt states she has a had a "cackle for a bit   . Dizziness    Pt has been having dizziness and "zig zag of eyes"    HPI: Julie Boone 85 y.o. come in for   sda  Sent in by nurse triage  Cp radiation to left shoulder   upset stomach and  Visual distortions   And  Hx diarrhea and anorexia.    Here with daughter .  Pain left  Side and under shoulder blade.  Yesterday no trauma .    Extra use.  Wobbly but no  Falling.  zewbra stripeds   And   abouat 3 mointues  Usually gets better with food. This has been going on for a long time and not new   Minor cough no vomiting  Diarrhea  Past 2-3  Weeks  Had one night of chattering  Cold  But  .  No fever   Has had episodes of concern daughter of sweating and ? Nausea   Last 30 minutes but noi sob with this .   ROS: See pertinent positives and negatives per HPI. No symcope  Nero sx   swelling  Past Medical History:  Diagnosis Date  . Acute sinusitis with symptoms greater than 10 days 06/23/2013  . Arthritis   . Breast cancer ([place])    mastectomy 3   neg ln   . HOH (hard of hearing)   . HTN (hypertension)   . Wears glasses   . Wears hearing aid    both ears    Family History  Problem Relation Age of Onset  . Hypertension Mother   . Stroke Mother   . Heart failure Mother   . Cancer Sister        lung   . Colon cancer Father     Social History   Socioeconomic History  . Marital status: Widowed    Spouse name: Not on file  . Number of children: Not on file  . Years of education: Not on file  . Highest education level: Not on file  Occupational History  . Not on file  Social Needs  . Financial resource strain: Not on file  . Food insecurity:    Worry: Not on file    Inability: Not on file  . Transportation needs:    Medical: Not on file    Non-medical: Not on  file  Tobacco Use  . Smoking status: Never Smoker  . Smokeless tobacco: Never Used  Substance and Sexual Activity  . Alcohol use: Yes    Alcohol/week: 0.0 standard drinks    Comment: 1 time a month. has a sip ever now and then   . Drug use: No  . Sexual activity: Not on file  Lifestyle  . Physical activity:    Days per week: Not on file    Minutes per session: Not on file  . Stress: Not on file  Relationships  . Social connections:    Talks on phone: Not on file    Gets together: Not on file    Attends religious service: Not on file    Active member of club or organization: Not on file    Attends meetings of clubs or organizations: Not on file    Relationship status: Not on file  Other Topics  Concern  . Not on file  Social History Narrative   Usually receives 5 hours of sleep per night   2people living in the home   Puppy  smakk [place] and [place]   From [place] moved from [place]  Adopted daughter nearby.      Neg ets  Wine with meals  No tob rd.   G0P0   Married husband [place] background  H[place]elf [place] descent.   Husband passed CHF and prostate cancer 2017   Now living with daughter          Outpatient Medications Prior to Visit  Medication Sig Dispense Refill  . atenolol (TENORMIN) 50 MG tablet TAKE 1 AND 1/2 TABLETS EVERY DAY 135 tablet 1  . calcium carbonate (OS-CAL - DOSED IN MG OF ELEMENTAL CALCIUM) 1250 (500 Ca) MG tablet Take 1 tablet by mouth daily with breakfast.    . lisinopril (PRINIVIL,ZESTRIL) 40 MG tablet Take 1 tablet (40 mg total) by mouth daily. 90 tablet 1  . meclizine (ANTIVERT) 25 MG tablet Take 1 tablet (25 mg total) by mouth every 6 (six) hours as needed for dizziness. 40 tablet 0  . bisacodyl (DULCOLAX) 10 MG suppository Place 1 suppository (10 mg total) rectally as needed for moderate constipation. (Patient not taking: Reported on 03/30/2018) 12 suppository 0  . Coenzyme Q10 (CO Q 10 PO) Take 1 tablet by mouth daily.     [name] doxycycline (VIBRAMYCIN)  100 MG capsule Take 1 capsule (100 mg total) by mouth 2 (two) times daily. (Patient not taking: Reported on 10/21/2017) 14 capsule 0  . guaiFENesin (MUCINEX) 600 MG 12 hr tablet Take 600-1,200 mg by mouth 2 (two) times daily.    [name] guaifenesin (ROBITUSSIN) 100 MG/5ML syrup Take 100 mg by mouth 3 (three) times daily as needed for cough.    . ondansetron (ZOFRAN ODT) 8 MG disintegrating tablet Take 1 tablet (8 mg total) by mouth every 8 (eight) hours as needed for nausea. (Patient not taking: Reported on 10/21/2017) 20 tablet 0  . polyethylene glycol (MIRALAX / GLYCOLAX) packet Take 17 g by mouth daily. (Patient not taking: Reported on 10/21/2017) 14 each 0  . predniSONE (DELTASONE) 20 MG tablet 2 tabs x 3 days, 1 tab x 3 days, 1/2 tab x 3 days, 1/2 tab M,W,F x 2 weeks (Patient not taking: Reported on 03/30/2018) 40 tablet 1  . promethazine (PHENERGAN) 25 MG tablet Take 25 mg by mouth every 6 (six) hours as needed for nausea or vomiting.    . ranitidine (ZANTAC) 150 MG tablet TAKE 1 TABLET BY MOUTH TWICE A DAY (Patient not taking: Reported on 10/21/2017) 60 tablet 1  . traMADol (ULTRAM) 50 MG tablet TAKE 1 TABLET BY MOUTH TWICE A DAY AS needed FOR PAIN (Patient not taking: Reported on 03/30/2018) 40 tablet 0   No facility-administered medications prior to visit.      EXAM:  BP 124/62 (BP Location: Right Arm, Patient Position: Sitting, Cuff Size: Normal)   Pulse 76   Temp 99 F (37.2 C) (Oral)   Wt 168 lb (76.2 kg)   SpO2 92%   BMI 29.76 kg/m   Body mass index is 29.76 kg/m.  GENERAL: vitals reviewed and listed above, alert, oriented, appears well hydrated and in no acute distress  Cooperative and ambulatory  Gest dizzy when laying down HEENT: atraumatic, conjunctiva  clear, no obvious abnormalities on inspection of external nose and ears OP : no lesion edema or exudate  NECK: no obvious masses  on inspection palpation  LUNGS: clear to auscultation bilaterally, no wheezes, rales or rhonchi,    Points to left lateral anterior rib area but no tendenress   Left breast absent  abd no massses or tenderness CV: HRRR, no clubbing cyanosis or  peripheral edema nl cap refill  MS: moves all extremities without noticeable focal  Abnormality djd    Ambulatory without assistance  PSYCH: pleasant and cooperative, no obvious depression or anxiety Lab Results  Component Value Date   WBC 16.8 (H) 03/24/2017   HGB 15.8 (H) 03/24/2017   HCT 45.8 03/24/2017   PLT 287 03/24/2017   GLUCOSE 187 (H) 03/24/2017   CHOL 204 (H) 10/31/2014   TRIG 269.0 (H) 10/31/2014   HDL 32.60 (L) 10/31/2014   LDLDIRECT 110.0 10/31/2014   ALT 32 03/24/2017   AST 30 03/24/2017   NA 136 03/24/2017   K 3.7 03/24/2017   CL 100 (L) 03/24/2017   CREATININE 1.02 (H) 03/24/2017   BUN 26 (H) 03/24/2017   CO2 23 03/24/2017   TSH 1.56 12/02/2016   HGBA1C 6.1 (A) 10/21/2017   BP Readings from Last 3 Encounters:  03/30/18 124/62  11/23/17 138/80  10/21/17 138/80  ekg sr lad  c xray  Atel vs fluid vs other left base    ASSESSMENT AND PLAN:  Discussed the following assessment and plan:  Left-sided chest pain - Plan: EKG 12-Lead, DG Chest 2 View, Basic metabolic panel, CBC with Differential/Platelet, Hepatic function panel, C-reactive protein, POCT Urinalysis Dipstick (Automated), CT Chest W Contrast, Ambulatory referral to Cardiology  Chest pain on breathing - Plan: CT Chest W Contrast  Pleural effusion, not elsewhere classified ? on x ray - Plan: CT Chest W Contrast  Diaphoresis  episodes  - consdier card sources - Plan: Ambulatory referral to Cardiology   Current pain  Doesn't seem cardiac but pleuritis?   Hx of shaking chils but no fever  Like pna  Add antibiotic and get ct scan  To define  What is going on at left chest   Other spells  Could be cardiac but unclear  No sx today of this ( daughter aware of heart sx as she has had mi )   The eye sx apparently are not new and no obv focal deficits .  Multi sx  not explained by one defined problem  Keep appt next week and get ct and lab today  -Patient advised to return or notify health care team  if  new concerns arise.  Patient Instructions  Checking  X ray and lab   Concern that  Sweating spells could be heart related .  But exam is good today .  Would like you to see cardiology .  X-ray shows some streakiness in the left base where you are having the pain. We will begin an antibiotic and consideration for getting a chest CT to get a better look at the area. You will be contacted about this scan      Standley Brooking. Sreenidhi Ganson M.D.

## 2018-03-30 NOTE — Patient Instructions (Addendum)
Checking  X ray and lab   Concern that  Sweating spells could be heart related .  But exam is good today .  Would like you to see cardiology .  X-ray shows some streakiness in the left base where you are having the pain. We will begin an antibiotic and consideration for getting a chest CT to get a better look at the area. You will be contacted about this scan

## 2018-03-30 NOTE — Telephone Encounter (Signed)
Pt scheduled today at 3pm Will send to Nurse as Juluis Rainier

## 2018-03-30 NOTE — Telephone Encounter (Signed)
Pt's daughter called with several complaints from her mom. The past couple of weeks she has had an upset stomach and not eating much.  She has seen zig zag with the left eye vision, 3 times in the past week and half. Last night she experienced chest pain on the left while she was in bed. It hurt to take a deep breath. She took 3 Asprins and went back to sleep. She has no pain now. She denied having cardiac symptoms with the chest pain last night She has body aches. Diarrhea off and on in the last 2 weeks. She has a cough. No fever. Her daughter is requesting an appointment for her.  Appointment scheduled. Advised her daughter to call 911 for chest pain with cardiac symptoms: shortness of breath, sweating, nausea, weakness, or dizziness. She voiced understanding. Routing to flow at [facility] [place].   Reason for Disposition . [1] Chest pain lasting <= 5 minutes AND [2] NO chest pain or cardiac symptoms now(Exceptions: pains lasting a few seconds)  Answer Assessment - Initial Assessment Questions 1. LOCATION: "Where does it hurt?"       Left side 2. RADIATION: "Does the pain go anywhere else?" (e.g., into neck, jaw, arms, back)      Her shoulder 3. ONSET: "When did the chest pain begin?" (Minutes, hours or days)      Happened last night 4. PATTERN "Does the pain come and go, or has it been constant since it started?"  "Does it get worse with exertion?"      no 5. DURATION: "How long does it last" (e.g., seconds, minutes, hours)     A few mins 6. SEVERITY: "How bad is the pain?"  (e.g., Scale 1-10; mild, moderate, or severe)    - MILD (1-3): doesn't interfere with normal activities     - MODERATE (4-7): interferes with normal activities or awakens from sleep    - SEVERE (8-10): excruciating pain, unable to do any normal activities       Pain #7 7. CARDIAC RISK FACTORS: "Do you have any history of heart problems or risk factors for heart disease?" (e.g., prior heart attack, angina;  high blood pressure, diabetes, being overweight, high cholesterol, smoking, or strong family history of heart disease)     Family history of heart disease, high blood pressure 8. PULMONARY RISK FACTORS: "Do you have any history of lung disease?"  (e.g., blood clots in lung, asthma, emphysema, birth control pills)    no 9. CAUSE: "What do you think is causing the chest pain?"     Don't know 10. OTHER SYMPTOMS: "Do you have any other symptoms?" (e.g., dizziness, nausea, vomiting, sweating, fever, difficulty breathing, cough)       Nausea, cough, hurt to breath last night 11. PREGNANCY: "Is there any chance you are pregnant?" "When was your last menstrual period?"       n/a  Protocols used: CHEST PAIN-A-AH

## 2018-03-31 LAB — HEPATIC FUNCTION PANEL
ALT: 13 U/L (ref 0–35)
AST: 14 U/L (ref 0–37)
Albumin: 3.9 g/dL (ref 3.5–5.2)
Alkaline Phosphatase: 76 U/L (ref 39–117)
Bilirubin, Direct: 0.1 mg/dL (ref 0.0–0.3)
Total Bilirubin: 0.6 mg/dL (ref 0.2–1.2)
Total Protein: 6.3 g/dL (ref 6.0–8.3)

## 2018-03-31 LAB — CBC WITH DIFFERENTIAL/PLATELET
Basophils Absolute: 0.2 10*3/uL — ABNORMAL HIGH (ref 0.0–0.1)
Basophils Relative: 1.3 % (ref 0.0–3.0)
Eosinophils Absolute: 0.5 10*3/uL (ref 0.0–0.7)
Eosinophils Relative: 3.9 % (ref 0.0–5.0)
HCT: 43.9 % (ref 36.0–46.0)
Hemoglobin: 14.6 g/dL (ref 12.0–15.0)
Lymphocytes Relative: 15.2 % (ref 12.0–46.0)
Lymphs Abs: 1.8 10*3/uL (ref 0.7–4.0)
MCHC: 33.2 g/dL (ref 30.0–36.0)
MCV: 96.6 fl (ref 78.0–100.0)
Monocytes Absolute: 1.6 10*3/uL — ABNORMAL HIGH (ref 0.1–1.0)
Monocytes Relative: 13.1 % — ABNORMAL HIGH (ref 3.0–12.0)
NEUTROS ABS: 8.1 10*3/uL — AB (ref 1.4–7.7)
Neutrophils Relative %: 66.5 % (ref 43.0–77.0)
PLATELETS: 250 10*3/uL (ref 150.0–400.0)
RBC: 4.55 Mil/uL (ref 3.87–5.11)
RDW: 12.5 % (ref 11.5–15.5)
WBC: 12.1 10*3/uL — ABNORMAL HIGH (ref 4.0–10.5)

## 2018-03-31 LAB — C-REACTIVE PROTEIN: CRP: 3.3 mg/dL (ref 0.5–20.0)

## 2018-03-31 LAB — BASIC METABOLIC PANEL
BUN: 31 mg/dL — ABNORMAL HIGH (ref 6–23)
CO2: 25 meq/L (ref 19–32)
Calcium: 9.4 mg/dL (ref 8.4–10.5)
Chloride: 101 mEq/L (ref 96–112)
Creatinine, Ser: 1.11 mg/dL (ref 0.40–1.20)
GFR: 45.97 mL/min — ABNORMAL LOW (ref >60.00)
Glucose, Bld: 130 mg/dL — ABNORMAL HIGH (ref 70–99)
Potassium: 4.5 mEq/L (ref 3.5–5.1)
Sodium: 136 mEq/L (ref 135–145)

## 2018-04-01 ENCOUNTER — Ambulatory Visit (INDEPENDENT_AMBULATORY_CARE_PROVIDER_SITE_OTHER)
Admission: RE | Admit: 2018-04-01 | Discharge: 2018-04-01 | Disposition: A | Discharge status: Home / Self Care | Payer: Medicare Other | Source: Ambulatory Visit | Attending: Internal Medicine | Admitting: Internal Medicine

## 2018-04-01 ENCOUNTER — Encounter (HOSPITAL_COMMUNITY): Payer: Self-pay | Admitting: Emergency Medicine

## 2018-04-01 ENCOUNTER — Telehealth: Payer: Self-pay | Admitting: Internal Medicine

## 2018-04-01 ENCOUNTER — Emergency Department (HOSPITAL_COMMUNITY)
Admission: EM | Admit: 2018-04-01 | Discharge: 2018-04-01 | Disposition: A | Discharge status: Home / Self Care | Payer: Medicare Other | Attending: Emergency Medicine | Admitting: Emergency Medicine

## 2018-04-01 DIAGNOSIS — I10 Essential (primary) hypertension: Secondary | ICD-10-CM | POA: Diagnosis not present

## 2018-04-01 DIAGNOSIS — Z79899 Other long term (current) drug therapy: Secondary | ICD-10-CM | POA: Diagnosis not present

## 2018-04-01 DIAGNOSIS — I2693 Single subsegmental pulmonary embolism without acute cor pulmonale: Secondary | ICD-10-CM | POA: Insufficient documentation

## 2018-04-01 DIAGNOSIS — R0789 Other chest pain: Secondary | ICD-10-CM | POA: Diagnosis present

## 2018-04-01 DIAGNOSIS — R079 Chest pain, unspecified: Secondary | ICD-10-CM | POA: Diagnosis not present

## 2018-04-01 DIAGNOSIS — J90 Pleural effusion, not elsewhere classified: Secondary | ICD-10-CM

## 2018-04-01 DIAGNOSIS — R071 Chest pain on breathing: Secondary | ICD-10-CM

## 2018-04-01 LAB — BRAIN NATRIURETIC PEPTIDE: B Natriuretic Peptide: 84.9 pg/mL (ref 0.0–100.0)

## 2018-04-01 LAB — PROTIME-INR
INR: 0.93
Prothrombin Time: 12.4 seconds (ref 11.4–15.2)

## 2018-04-01 LAB — I-STAT TROPONIN, ED: Troponin i, poc: 0 ng/mL (ref 0.00–0.08)

## 2018-04-01 LAB — TROPONIN I: Troponin I: <0.03 ng/mL (ref <0.03)

## 2018-04-01 MED ORDER — IOPAMIDOL (ISOVUE-300) INJECTION 61%
75.0000 mL | Freq: Once | INTRAVENOUS | Status: AC | PRN
  Administered 2018-04-01: 75 mL via INTRAVENOUS

## 2018-04-01 MED ORDER — RIVAROXABAN 15 MG PO TABS
15.0000 mg | ORAL_TABLET | Freq: Once | ORAL | Status: AC
  Administered 2018-04-01: 15 mg via ORAL
  Filled 2018-04-01: qty 1

## 2018-04-01 MED ORDER — RIVAROXABAN 20 MG PO TABS: 20.0000 mg | ORAL_TABLET | Freq: Every day | ORAL | Status: DC

## 2018-04-01 MED ORDER — RIVAROXABAN 15 MG PO TABS: 15.0000 mg | ORAL_TABLET | Freq: Two times a day (BID) | ORAL | Status: DC

## 2018-04-01 MED ORDER — RIVAROXABAN (XARELTO) VTE STARTER PACK (15 & 20 MG): 15.0000 mg | ORAL_TABLET | Freq: Two times a day (BID) | ORAL | 0 refills | Status: DC

## 2018-04-01 MED ORDER — RIVAROXABAN (XARELTO) EDUCATION KIT FOR DVT/PE PATIENTS
PACK | Freq: Once | Status: AC
  Administered 2018-04-01: 17:00:00
  Filled 2018-04-01: qty 1

## 2018-04-01 NOTE — ED Triage Notes (Signed)
Per daughter, states pain where she had a left breast mastectomy for about 2 months-states she had a CT scan done today and it showed left PE-was told to come to ED for eval

## 2018-04-01 NOTE — ED Notes (Signed)
D/c paperwork reviewed with pt and daughter.  Pt verbalized understanding to pick up prescription from preferred pharmacy.

## 2018-04-01 NOTE — Progress Notes (Signed)
ANTICOAGULATION CONSULT NOTE - Initial Consult  Pharmacy Consult for rivaroxaban Indication: pulmonary embolus  Allergies  Allergen Reactions  . Morphine And Related Nausea And Vomiting    Patient Measurements:   Most recent weight = 76.2 kg recorded on 03/30/18  Vital Signs: Temp: 98.5 F (36.9 C) (01/31 1509) Temp Source: Oral (01/31 1509) BP: 146/59 (01/31 1752) Pulse Rate: 67 (01/31 1752)  Labs: Recent Labs    03/30/18 1619 04/01/18 1528  HGB 14.6  --   HCT 43.9  --   PLT 250.0  --   LABPROT  --  12.4  INR  --  0.93  CREATININE 1.11  --   TROPONINI  --  <0.03    Estimated Creatinine Clearance: 31.6 mL/min (by C-G formula based on SCr of 1.11 mg/dL).   Medical History: Past Medical History:  Diagnosis Date  . Acute sinusitis with symptoms greater than 10 days 06/23/2013  . Arthritis   . Breast cancer ([place])    mastectomy 3   neg ln   . HOH (hard of hearing)   . HTN (hypertension)   . Wears glasses   . Wears hearing aid    both ears    Assessment: Pharmacy consulted for rivaroxaban dosing and education in this 85 year old female diagnosed with a PE.   Pt not on anticoagulants PTA Labs 03/30/18: Hgb 14.6, Plt 250 INR 0.96 SCR (03/30/18): 1.1. CrCl using total body weight = 43 mL/min  Goal of Therapy:  Monitor platelets by anticoagulation protocol: Yes   Plan:   Rivaroxaban 15 mg PO BID with meals x 21 days followed by rivaroxaban 20 mg PO daily with meals  Manufacturer coupon provided per pharmacy. Medication counseling/education provided to patient and patient's daughter.  Monitor for signs/symptoms of bleeding  Follow renal function  Lenis Noon, PharmD 04/01/2018,6:04 PM

## 2018-04-01 NOTE — Discharge Instructions (Signed)
You were seen in the ER for abnormal CT scan that showed a pulmonary embolism.  Lab work, vital signs and walk test are normal.  We recommended short overnight observation but you declined and requested discharge.  Take Xarelto as prescribed.  Return to the ER for worsening or new symptoms, lightheadedness, passing out, chest pain, shortness of breath, persistent or heavy bleeding.  Information on my medicine - XARELTO (rivaroxaban)  This medication education was reviewed with me or my healthcare representative as part of my discharge preparation.  The pharmacist that spoke with me during my hospital stay was:  [place] PRESCRIBED FOR YOU? Xarelto was prescribed to treat blood clots that may have been found in the veins of your legs (deep vein thrombosis) or in your lungs (pulmonary embolism) and to reduce the risk of them occurring again.  What do you need to know about Xarelto? The starting dose is one 15 mg tablet taken TWICE daily with food for the FIRST 21 DAYS then on 04/23/18 the dose is changed to one 20 mg tablet taken ONCE A DAY with your evening meal.  DO NOT stop taking Xarelto without talking to the health care provider who prescribed the medication.  Refill your prescription for 20 mg tablets before you run out.  After discharge, you should have regular check-up appointments with your healthcare provider that is prescribing your Xarelto.  In the future your dose may need to be changed if your kidney function changes by a significant amount.  What do you do if you miss a dose? If you are taking Xarelto TWICE DAILY and you miss a dose, take it as soon as you remember. You may take two 15 mg tablets (total 30 mg) at the same time then resume your regularly scheduled 15 mg twice daily the next day.  If you are taking Xarelto ONCE DAILY and you miss a dose, take it as soon as you remember on the same day then continue your regularly scheduled once daily regimen  the next day. Do not take two doses of Xarelto at the same time.   Important Safety Information Xarelto is a blood thinner medicine that can cause bleeding. You should call your healthcare provider right away if you experience any of the following: ? Bleeding from an injury or your nose that does not stop. ? Unusual colored urine (red or dark brown) or unusual colored stools (red or black). ? Unusual bruising for unknown reasons. ? A serious fall or if you hit your head (even if there is no bleeding).  Some medicines may interact with Xarelto and might increase your risk of bleeding while on Xarelto. To help avoid this, consult your healthcare provider or pharmacist prior to using any new prescription or non-prescription medications, including herbals, vitamins, non-steroidal anti-inflammatory drugs (NSAIDs) and supplements.  This website has more information on Xarelto: [email or web address]

## 2018-04-01 NOTE — Telephone Encounter (Signed)
Julie Boone, PRA, from [facility] Radiology called to report CT chest, "1. Central left-sided pulmonary embolism. No right-sided pulmonary emboli. No findings for right heart strain. 2. Moderate atherosclerotic calcifications involving the thoracic aorta but no aneurysm or dissection. 3. Basilar interstitial disease but no acute pulmonary findings or worrisome pulmonary lesions. 4. Diffuse fatty infiltration of the liver."; findings repeated for correctness; read by Dr Candise Che; notified  [name] at [facility]; pt last seen by Dr Regis Bill, [facility], 03/30/2018; will also route to office for notification.

## 2018-04-01 NOTE — ED Notes (Signed)
Pt walked in the hall and the lowest her O2 went was 94%.  She said she did not feel dizzy but she said her feet felt stupid.  She was slightly off balanced.

## 2018-04-01 NOTE — ED Provider Notes (Addendum)
[place] DEPT Provider Note   CSN: [id number] Arrival date & time: 04/01/18  1455     History   Chief Complaint Chief Complaint  Patient presents with  . abnormal CT    HPI Julie Boone is a 85 y.o. female with history of left breast cancer status post mastectomy on surveillance greater than 7 to 8 years, hard of hearing, hypertension is here for evaluation of abnormal CT chest obtained by PCP earlier today.  CT shows central left-sided pulmonary embolism without evidence of right heart strain.  Patient reports sudden onset left sharp breast pain with inspiration on Tuesday.  This spontaneously resolved.  Has had intermittent associated fatigue, coldness, clamminess, sweats, nausea and vision changes described as "my eyes crossing".  No longer having pain in her chest or difficulty breathing.  Reports long standing "burning" to her heels and lower legs for a long time but but no unilateral swelling, redness, warmth.  She denies history of blood clots.  She denies recent surgery, prolonged travel or immobilization, estrogen use, hemoptysis.  No interventions.  No alleviating or aggravating factors.  Reports long history of bleeding painless hemorrhoids, states that symptoms today bleed a lot and sometimes it is scant.  Last time she noted any hematochezia was 1 week ago. HPI  Past Medical History:  Diagnosis Date  . Acute sinusitis with symptoms greater than 10 days 06/23/2013  . Arthritis   . Breast cancer ([place])    mastectomy 3   neg ln   . HOH (hard of hearing)   . HTN (hypertension)   . Wears glasses   . Wears hearing aid    both ears    Patient Active Problem List   Diagnosis Date Noted  . Abnormal chest x-ray 09/22/2013  . Allergic rhinitis 06/23/2013  . Wax in ear 06/23/2013  . Abnormal thyroid blood test 04/14/2013  . Elevated blood sugar 04/14/2013  . Decreased hearing 04/14/2013  . Wears hearing aid 03/03/2013  . Essential  hypertension 03/03/2013  . Leg cramps 03/03/2013  . Influenza vaccination declined by patient 03/03/2013  . Pneumococcal vaccination declined by patient 03/03/2013  . Breast cancer [facility])     Past Surgical History:  Procedure Laterality Date  . APPENDECTOMY  2006  . BREAST BIOPSY Left 2012  . COLONOSCOPY    . HERNIA REPAIR    . INSERTION OF MESH N/A 09/05/2013   Procedure: INSERTION OF MESH;  Surgeon: Joyice Faster. Cornett, MD;  Location: [place];  Service: General;  Laterality: N/A;  . MASTECTOMY Left 2013  . TONSILLECTOMY AND ADENOIDECTOMY     Childhood  . UMBILICAL HERNIA REPAIR N/A 09/05/2013   Procedure: HERNIA REPAIR UMBILICAL ADULT WITH MESH;  Surgeon: Joyice Faster. Cornett, MD;  Location: [place];  Service: General;  Laterality: N/A;     OB History    Gravida  0   Para  0   Term  0   Preterm  0   AB  0   Living  0     SAB  0   TAB  0   Ectopic  0   Multiple  0   Live Births               Home Medications    Prior to Admission medications   Medication Sig Start Date End Date Taking? Authorizing Provider  atenolol (TENORMIN) 50 MG tablet TAKE 1 AND 1/2 TABLETS EVERY DAY 10/21/17   Panosh,  Standley Brooking, MD  azithromycin (ZITHROMAX Z-PAK) 250 MG tablet Take 2 po first day, then 1 po qd 03/30/18   Panosh, Standley Brooking, MD  bisacodyl (DULCOLAX) 10 MG suppository Place 1 suppository (10 mg total) rectally as needed for moderate constipation. Patient not taking: Reported on 03/30/2018 03/24/17   Varney Biles, MD  calcium carbonate (OS-CAL - DOSED IN MG OF ELEMENTAL CALCIUM) 1250 (500 Ca) MG tablet Take 1 tablet by mouth daily with breakfast.    [provider]  Coenzyme Q10 (CO Q 10 PO) Take 1 tablet by mouth daily.     [provider]  doxycycline (VIBRAMYCIN) 100 MG capsule Take 1 capsule (100 mg total) by mouth 2 (two) times daily. Patient not taking: Reported on 10/21/2017 03/24/17   Varney Biles, MD  guaiFENesin  (MUCINEX) 600 MG 12 hr tablet Take 600-1,200 mg by mouth 2 (two) times daily.    [provider]  guaifenesin (ROBITUSSIN) 100 MG/5ML syrup Take 100 mg by mouth 3 (three) times daily as needed for cough.    [provider]  lisinopril (PRINIVIL,ZESTRIL) 40 MG tablet Take 1 tablet (40 mg total) by mouth daily. 10/21/17   Panosh, Standley Brooking, MD  meclizine (ANTIVERT) 25 MG tablet Take 1 tablet (25 mg total) by mouth every 6 (six) hours as needed for dizziness. 02/19/14   Rolland Porter, MD  ondansetron (ZOFRAN ODT) 8 MG disintegrating tablet Take 1 tablet (8 mg total) by mouth every 8 (eight) hours as needed for nausea. Patient not taking: Reported on 10/21/2017 03/24/17   Varney Biles, MD  polyethylene glycol (MIRALAX / GLYCOLAX) packet Take 17 g by mouth daily. Patient not taking: Reported on 10/21/2017 03/24/17   Varney Biles, MD  predniSONE (DELTASONE) 20 MG tablet 2 tabs x 3 days, 1 tab x 3 days, 1/2 tab x 3 days, 1/2 tab M,W,F x 2 weeks Patient not taking: Reported on 03/30/2018 11/23/17   Panosh, Standley Brooking, MD  promethazine (PHENERGAN) 25 MG tablet Take 25 mg by mouth every 6 (six) hours as needed for nausea or vomiting.    [provider]  ranitidine (ZANTAC) 150 MG tablet TAKE 1 TABLET BY MOUTH TWICE A DAY Patient not taking: Reported on 10/21/2017 06/09/17   Panosh, Standley Brooking, MD  Rivaroxaban 15 & 20 MG TBPK Take 15 mg by mouth 2 (two) times daily. Take as directed on package: Start with one 31m tablet by mouth twice a day with food. On Day 22, switch to one 246mtablet once a day with food. 04/01/18   GiKinnie FeilPA-C  traMADol (ULTRAM) 50 MG tablet TAKE 1 TABLET BY MOUTH TWICE A DAY AS needed FOR PAIN Patient not taking: Reported on 03/30/2018 11/23/17   Panosh, WaStandley BrookingMD    Family History Family History  Problem Relation Age of Onset  . Hypertension Mother   . Stroke Mother   . Heart failure Mother   . Cancer Sister        lung   . Colon cancer Father      Social History Social History   Tobacco Use  . Smoking status: Never Smoker  . Smokeless tobacco: Never Used  Substance Use Topics  . Alcohol use: Yes    Alcohol/week: 0.0 standard drinks    Comment: 1 time a month. has a sip ever now and then   . Drug use: No     Allergies   Morphine and related   Review of Systems Review of  Systems  Constitutional: Positive for diaphoresis and fatigue.  Eyes: Positive for visual disturbance.  Cardiovascular: Positive for chest pain (resolved).  Gastrointestinal: Positive for nausea.  All other systems reviewed and are negative.    Physical Exam Updated Vital Signs BP (!) 146/59 (BP Location: Right Arm)   Pulse 67   Temp 98.5 F (36.9 C) (Oral)   Resp 18   SpO2 97%   Physical Exam Vitals signs and nursing note reviewed.  Constitutional:      Appearance: She is well-developed.     Comments: Non toxic  HENT:     Head: Normocephalic and atraumatic.     Nose: Nose normal.  Eyes:     Conjunctiva/sclera: Conjunctivae normal.     Pupils: Pupils are equal, round, and reactive to light.  Neck:     Musculoskeletal: Normal range of motion.  Cardiovascular:     Rate and Rhythm: Normal rate and regular rhythm.     Pulses:          Radial pulses are 1+ on the right side and 1+ on the left side.       Dorsalis pedis pulses are 1+ on the right side and 1+ on the left side.     Comments: No LE edema.  Slight, diffuse tenderness to bilateral feet and lower legs. No asymmetric edema, erythema warmth.  Pulmonary:     Effort: Pulmonary effort is normal.     Breath sounds: Normal breath sounds.     Comments: Borderline tachypnea RR <30. No increased WOB.  Abdominal:     General: Bowel sounds are normal.     Palpations: Abdomen is soft.     Tenderness: There is no abdominal tenderness.  Musculoskeletal: Normal range of motion.  Skin:    General: Skin is warm and dry.     Capillary Refill: Capillary refill takes less than 2 seconds.   Neurological:     Mental Status: She is alert and oriented to person, place, and time.  Psychiatric:        Behavior: Behavior normal.      ED Treatments / Results  Labs (all labs ordered are listed, but only abnormal results are displayed) Labs Reviewed  PROTIME-INR  TROPONIN I  BRAIN NATRIURETIC PEPTIDE  I-STAT TROPONIN, ED    EKG EKG Interpretation  Date/Time:  Friday April 01 2018 15:13:37 EST Ventricular Rate:  77 PR Interval:    QRS Duration: 97 QT Interval:  405 QTC Calculation: 459 R Axis:   -43 Text Interpretation:  Sinus rhythm Left axis deviation RSR' in V1 or V2, right VCD or RVH No acute changes No significant change since last tracing Confirmed by Varney Biles [phone]) on 04/01/2018 5:04:06 PM   Radiology Ct Chest W Contrast  Result Date: 04/01/2018 CLINICAL DATA:  Three day history of chest pain. EXAM: CT CHEST WITH CONTRAST TECHNIQUE: Multidetector CT imaging of the chest was performed during intravenous contrast administration. CONTRAST:  68m ISOVUE-300 IOPAMIDOL (ISOVUE-300) INJECTION 61% COMPARISON:  Chest x-ray 03/30/2018 FINDINGS: Cardiovascular: The heart is normal in size. No pericardial effusion. The aorta is normal in caliber. No dissection. Moderate scattered atherosclerotic calcifications. The branch vessels are patent. Ostial calcifications noted at the subclavian artery origin. Scattered coronary artery calcifications. Central left-sided pulmonary embolism is noted with clot draped over the upper lobe and lower lobe pulmonary arteries. A do not see any definite right-sided pulmonary emboli. No evidence of right heart strain. Mediastinum/Nodes: No mediastinal or hilar mass or adenopathy. Small scattered  lymph nodes are noted. Lungs/Pleura: No acute pulmonary findings. Streaky basilar atelectasis and basilar interstitial disease. No pleural effusions or worrisome pulmonary lesions. Upper Abdomen: No significant upper abdominal findings. Diffuse fatty  infiltration of the liver is noted. Musculoskeletal: No acute or significant bony findings. Moderate degenerative changes involving the thoracic spine. IMPRESSION: 1. Central left-sided pulmonary embolism. No right-sided pulmonary emboli. No findings for right heart strain. 2. Moderate atherosclerotic calcifications involving the thoracic aorta but no aneurysm or dissection. 3. Basilar interstitial disease but no acute pulmonary findings or worrisome pulmonary lesions. 4. Diffuse fatty infiltration of the liver. These results will be called to the ordering clinician or representative by the Radiologist Assistant, and communication documented in the PACS or zVision Dashboard. Aortic Atherosclerosis (ICD10-I70.0) and Emphysema (ICD10-J43.9). Electronically Signed   By: Marijo Sanes M.D.   On: 04/01/2018 13:51    Procedures .Critical Care Performed by: Kinnie Feil, PA-C Authorized by: Kinnie Feil, PA-C   Critical care provider statement:    Critical care time (minutes):  45   Critical care was necessary to treat or prevent imminent or life-threatening deterioration of the following conditions: pulmonary embolism.   Critical care was time spent personally by me on the following activities:  Discussions with consultants, evaluation of patient's response to treatment, examination of patient, ordering and performing treatments and interventions, ordering and review of laboratory studies, ordering and review of radiographic studies, pulse oximetry, re-evaluation of patient's condition, obtaining history from patient or surrogate and review of old charts   I assumed direction of critical care for this patient from another provider in my specialty: no     (including critical care time)  Medications Ordered in ED Medications  Rivaroxaban (XARELTO) tablet 15 mg (has no administration in time range)  Rivaroxaban (XARELTO) tablet 15 mg (has no administration in time range)    Followed by    rivaroxaban (XARELTO) tablet 20 mg (has no administration in time range)  rivaroxaban [employer] Reasons) Education Kit for DVT/PE patients ( Does not apply Given 04/01/18 1711)     Initial Impression / Assessment and Plan / ED Course  I have reviewed the triage vital signs and the nursing notes.  Pertinent labs & imaging results that were available during my care of the patient were reviewed by me and considered in my medical decision making (see chart for details).  Clinical Course as of Apr 01 1754  Fri Apr 01, 2018  1650 Spoke to Dr Regis Bill to give update and disposition, aware pt and daughter requesting discharge. Pt has appt with her on 2/4.      [CG]    Clinical Course User Index [CG] Kinnie Feil, PA-C   85 year old is here with what seems like unprovoked pulmonary embolism.  No ischemic EKG changes.  Undetectable troponin.  Baseline PT/INR obtained.  BNP normal.  She has been hemodynamically stable here.  Intermittently tachypneic but with no increased work of breathing.  She is ambulatory at baseline without desaturations, lightheadedness or any symptoms.  PESI score is 122 class IV, high risk with 4-11% 30 day mortality given age and h/o cancer.  She has hematochezia from hemorrhoids as well.  Given her age and risk I recommended overnight observation.  I am concerned for risk of fall due to her age, deconditioning and intermittent difficulty with balance.  I discussed this with them and they are aware they need to be careful at home.  Patient declined this and is requesting discharge.  Daughter at  bedside lives with her and is comfortable taking her home. I spoke to Dr Regis Bill and provided update. Pt has appt with PCP 2/4.  Strict return precautions given. Shared with Dr Kathrynn Humble.  Final Clinical Impressions(s) / ED Diagnoses   Final diagnoses:  Single subsegmental pulmonary embolism without acute cor pulmonale    ED Discharge Orders         Ordered    Rivaroxaban 15 & 20 MG TBPK   2 times daily     04/01/18 1749           Kinnie Feil, PA-C 04/01/18 Melina Fiddler, MD 04/03/18 [phone]

## 2018-04-01 NOTE — Telephone Encounter (Signed)
Pt's daughter has been advised of results and is taking pt to [facility] ED. Panosh has been advised. Nothing further needed.

## 2018-04-04 NOTE — Progress Notes (Signed)
Chief Complaint  Patient presents with  . Follow-up    ED follow up. Pt states she has feeling better since Ed . PT states that she has numbness in the left hand but believes it could be from knitting. Pt states the pain in the side has subsided but has queasy stomach in morning that goes away could be from medication change     HPI: LENOIR FACCHINI 85 y.o. come in for fu  Dx of  cenetral left PE unprovoked    And opted to be sent home from ED with Xarelto.  Risk of catching infection in the hospital being considered.  Since that time she is done quite well denies chest pain shortness of breath at this time and is feeling much better Lab work was done in the ED but no other Dopplers or imaging studies. No history of DVT her right leg is chronically bothering her that she attributes to her knee arthritis for which she has had multiple injections.  It is mildly swollen more than the left but she attributes that to her arthritis.  She also has a history of leg cramps for which she is taking an over-the-counter product which seems to be helpful for her that she heard on [facility].   No currently active bleeding uses her walker to avoid falling. She has for refill of that Antivert meclizine which she takes rarely but takes that before she goes to bed when she has the vertigo dizziness.  Her daughter is been pretty much at home his caretaker.  ROS: See pertinent positives and negatives per HPI.  Past Medical History:  Diagnosis Date  . Acute sinusitis with symptoms greater than 10 days 06/23/2013  . Arthritis   . Breast cancer ([place])    mastectomy 3   neg ln   . HOH (hard of hearing)   . HTN (hypertension)   . Wears glasses   . Wears hearing aid    both ears    Family History  Problem Relation Age of Onset  . Hypertension Mother   . Stroke Mother   . Heart failure Mother   . Cancer Sister        lung   . Colon cancer Father     Social History   Socioeconomic History  . Marital  status: Widowed    Spouse name: Not on file  . Number of children: Not on file  . Years of education: Not on file  . Highest education level: Not on file  Occupational History  . Not on file  Social Needs  . Financial resource strain: Not on file  . Food insecurity:    Worry: Not on file    Inability: Not on file  . Transportation needs:    Medical: Not on file    Non-medical: Not on file  Tobacco Use  . Smoking status: Never Smoker  . Smokeless tobacco: Never Used  Substance and Sexual Activity  . Alcohol use: Yes    Alcohol/week: 0.0 standard drinks    Comment: 1 time a month. has a sip ever now and then   . Drug use: No  . Sexual activity: Not on file  Lifestyle  . Physical activity:    Days per week: Not on file    Minutes per session: Not on file  . Stress: Not on file  Relationships  . Social connections:    Talks on phone: Not on file    Gets together: Not on file  Attends religious service: Not on file    Active member of club or organization: Not on file    Attends meetings of clubs or organizations: Not on file    Relationship status: Not on file  Other Topics Concern  . Not on file  Social History Narrative   Usually receives 5 hours of sleep per night   2people living in the home   [place]   From [place] moved from [place]  Adopted daughter nearby.      Neg ets  Wine with meals  No tob rd.   G0P0   Married husband [place] background  [place] descent.   Husband passed CHF and prostate cancer 2017   Now living with daughter          Outpatient Medications Prior to Visit  Medication Sig Dispense Refill  . atenolol (TENORMIN) 50 MG tablet TAKE 1 AND 1/2 TABLETS EVERY DAY 135 tablet 1  . calcium carbonate (OS-CAL - DOSED IN MG OF ELEMENTAL CALCIUM) 1250 (500 Ca) MG tablet Take 1 tablet by mouth daily with breakfast.    . Coenzyme Q10 (CO Q 10 PO) Take 1 tablet by mouth daily.     [name] guaiFENesin (MUCINEX) 600 MG 12 hr tablet  Take 600-1,200 mg by mouth 2 (two) times daily.    [name] guaifenesin (ROBITUSSIN) 100 MG/5ML syrup Take 100 mg by mouth 3 (three) times daily as needed for cough.    . Rivaroxaban 15 & 20 MG TBPK Take 15 mg by mouth 2 (two) times daily. Take as directed on package: Start with one 15mg  tablet by mouth twice a day with food. On Day 22, switch to one 20mg  tablet once a day with food. 51 each 0  . lisinopril (PRINIVIL,ZESTRIL) 40 MG tablet Take 1 tablet (40 mg total) by mouth daily. 90 tablet 1  . meclizine (ANTIVERT) 25 MG tablet Take 1 tablet (25 mg total) by mouth every 6 (six) hours as needed for dizziness. 40 tablet 0  . azithromycin (ZITHROMAX Z-PAK) 250 MG tablet Take 2 po first day, then 1 po qd (Patient not taking: Reported on 04/05/2018) 6 tablet 0  . bisacodyl (DULCOLAX) 10 MG suppository Place 1 suppository (10 mg total) rectally as needed for moderate constipation. (Patient not taking: Reported on 03/30/2018) 12 suppository 0  . doxycycline (VIBRAMYCIN) 100 MG capsule Take 1 capsule (100 mg total) by mouth 2 (two) times daily. (Patient not taking: Reported on 10/21/2017) 14 capsule 0  . ondansetron (ZOFRAN ODT) 8 MG disintegrating tablet Take 1 tablet (8 mg total) by mouth every 8 (eight) hours as needed for nausea. (Patient not taking: Reported on 10/21/2017) 20 tablet 0  . polyethylene glycol (MIRALAX / GLYCOLAX) packet Take 17 g by mouth daily. (Patient not taking: Reported on 10/21/2017) 14 each 0  . predniSONE (DELTASONE) 20 MG tablet 2 tabs x 3 days, 1 tab x 3 days, 1/2 tab x 3 days, 1/2 tab M,W,F x 2 weeks (Patient not taking: Reported on 03/30/2018) 40 tablet 1  . promethazine (PHENERGAN) 25 MG tablet Take 25 mg by mouth every 6 (six) hours as needed for nausea or vomiting.    . ranitidine (ZANTAC) 150 MG tablet TAKE 1 TABLET BY MOUTH TWICE A DAY (Patient not taking: Reported on 10/21/2017) 60 tablet 1  . traMADol (ULTRAM) 50 MG tablet TAKE 1 TABLET BY MOUTH TWICE A DAY AS needed FOR PAIN (Patient  not taking: Reported on 03/30/2018) 40 tablet  0   No facility-administered medications prior to visit.      EXAM:  BP 132/74 (BP Location: Right Arm, Patient Position: Sitting, Cuff Size: Normal)   Pulse 86   Temp 98.1 F (36.7 C) (Oral)   Ht 5' 2.75" (1.594 m)   Wt 164 lb 8 oz (74.6 kg)   SpO2 97%   BMI 29.37 kg/m   Body mass index is 29.37 kg/m.  GENERAL: vitals reviewed and listed above, alert, oriented, appears well hydrated and in no acute distress looks much more comfortable than last visit. HEENT: atraumatic, conjunctiva  clear, no obvious abnormalities on inspection of external nose and ears NECK: no obvious masses on inspection palpation  LUNGS: clear to auscultation bilaterally, no wheezes, rales or rhonchi,CV: HRRR, no clubbing cyanosis prominet  Veins legs right more than left  ( knee arthritis right) slight to 1+  peripheral edema nl cap refill  MS: moves all extremities  Ambulatory  PSYCH: pleasant and cooperative, no obvious depression or anxiety Lab Results  Component Value Date   WBC 12.1 (H) 03/30/2018   HGB 14.6 03/30/2018   HCT 43.9 03/30/2018   PLT 250.0 03/30/2018   GLUCOSE 130 (H) 03/30/2018   CHOL 204 (H) 10/31/2014   TRIG 269.0 (H) 10/31/2014   HDL 32.60 (L) 10/31/2014   LDLDIRECT 110.0 10/31/2014   ALT 13 03/30/2018   AST 14 03/30/2018   NA 136 03/30/2018   K 4.5 03/30/2018   CL 101 03/30/2018   CREATININE 1.11 03/30/2018   BUN 31 (H) 03/30/2018   CO2 25 03/30/2018   TSH 1.56 12/02/2016   INR 0.93 04/01/2018   HGBA1C 6.1 (A) 10/21/2017   BP Readings from Last 3 Encounters:  04/05/18 132/74  04/01/18 (!) 146/59  03/30/18 124/62   Wt Readings from Last 3 Encounters:  04/05/18 164 lb 8 oz (74.6 kg)  03/30/18 168 lb (76.2 kg)  11/23/17 166 lb 7 oz (75.5 kg)   Wt Readings from Last 3 Encounters:  04/05/18 164 lb 8 oz (74.6 kg)  03/30/18 168 lb (76.2 kg)  11/23/17 166 lb 7 oz (75.5 kg)     ASSESSMENT AND PLAN:  Discussed the  following assessment and plan:  Acute pulmonary embolism without acute cor pulmonale, unspecified pulmonary embolism type (HCC) - Plan: VAS [place] LOWER EXTREMITY VENOUS (DVT)  Essential hypertension Doing quite well clinically  Disc risk of bleed fall  Counseled. About antivert rare use and  Do not use without supervision   Risk of fall bleed seriousness  She says rare use and helps when she gets an issue.  An disc with her the reservations about this med .extreme;ly cautious use.   Still see cards about sweating spells not urgent. We will order vascular venous ultrasound of both lower extremities today to look for source and clot burden. If all okay plan follow-up visit that end of March.  Will be on anticoagulation for at least 6 months. -Patient advised to return or notify health care team  if  new concerns arise. Addendum ultrasound done today and extensive DVT right lower extremity noted see report. Patient Instructions  Fall precautions prefer  llimited or not the meclizine only if absolutely necessary  With  Moitoring.   You will be  Contacted about  Doppler tests of the legs to get mor info about why  You could have had a blood clot int he lungs.  Still see cards because of the sweating spells.   Opinion but may just  need to follo w.   Stay on blood thinner.   Plan ROV  In end of march or thereabouts .   Standley Brooking. Panosh M.D.

## 2018-04-05 ENCOUNTER — Encounter (HOSPITAL_COMMUNITY): Payer: Self-pay

## 2018-04-05 ENCOUNTER — Ambulatory Visit (HOSPITAL_COMMUNITY)
Admission: RE | Admit: 2018-04-05 | Discharge: 2018-04-05 | Disposition: A | Discharge status: Home / Self Care | Payer: Medicare Other | Source: Ambulatory Visit | Attending: Cardiology | Admitting: Cardiology

## 2018-04-05 ENCOUNTER — Encounter: Payer: Self-pay | Admitting: Internal Medicine

## 2018-04-05 ENCOUNTER — Ambulatory Visit (INDEPENDENT_AMBULATORY_CARE_PROVIDER_SITE_OTHER): Payer: Medicare Other | Admitting: Internal Medicine

## 2018-04-05 VITALS — BP 132/74 | HR 86 | Temp 98.1°F | Ht 62.75 in | Wt 164.5 lb

## 2018-04-05 DIAGNOSIS — I82401 Acute embolism and thrombosis of unspecified deep veins of right lower extremity: Secondary | ICD-10-CM | POA: Diagnosis not present

## 2018-04-05 DIAGNOSIS — I10 Essential (primary) hypertension: Secondary | ICD-10-CM | POA: Diagnosis not present

## 2018-04-05 DIAGNOSIS — I2699 Other pulmonary embolism without acute cor pulmonale: Secondary | ICD-10-CM | POA: Insufficient documentation

## 2018-04-05 MED ORDER — MECLIZINE HCL 25 MG PO TABS: ORAL_TABLET | ORAL | 0 refills | Status: DC

## 2018-04-05 MED ORDER — LISINOPRIL 40 MG PO TABS: 40.0000 mg | ORAL_TABLET | Freq: Every day | ORAL | 1 refills | Status: DC

## 2018-04-05 MED ORDER — LISINOPRIL 40 MG PO TABS: 40.0000 mg | ORAL_TABLET | Freq: Every day | ORAL | 0 refills | Status: DC

## 2018-04-05 NOTE — Patient Instructions (Addendum)
Fall precautions prefer  llimited or not the meclizine only if absolutely necessary  With  Moitoring.   You will be  Contacted about  Doppler tests of the legs to get mor info about why  You could have had a blood clot int he lungs.  Still see cards because of the sweating spells.   Opinion but may just need to follo w.   Stay on blood thinner.   Plan ROV  In end of march or thereabouts .

## 2018-04-05 NOTE — Progress Notes (Signed)
Bilateral lower extremity venous duplex completed. Evidence of DVT in the right lower extremity. Per Dr. Regis Bill, patient is to continue treatment with Xarelto for confirmed PE. Final report to follow.

## 2018-04-25 ENCOUNTER — Encounter: Payer: Self-pay | Admitting: Internal Medicine

## 2018-04-25 ENCOUNTER — Ambulatory Visit: Payer: Medicare Other | Admitting: Internal Medicine

## 2018-04-25 VITALS — BP 160/78 | HR 64 | Ht 65.5 in | Wt 169.4 lb

## 2018-04-25 DIAGNOSIS — I2699 Other pulmonary embolism without acute cor pulmonale: Secondary | ICD-10-CM | POA: Diagnosis not present

## 2018-04-25 DIAGNOSIS — R079 Chest pain, unspecified: Secondary | ICD-10-CM

## 2018-04-25 DIAGNOSIS — R600 Localized edema: Secondary | ICD-10-CM | POA: Diagnosis not present

## 2018-04-25 MED ORDER — APIXABAN 5 MG PO TABS: 5.0000 mg | ORAL_TABLET | Freq: Two times a day (BID) | ORAL | Status: DC

## 2018-04-25 NOTE — Patient Instructions (Signed)
Medication Instructions:  Your physician has recommended you make the following change in your medication:   Plan to switch to Elquis after lab results are available.  If you need a refill on your cardiac medications before your next appointment, please call your pharmacy.   Lab work: Art gallery manager today If you have labs (blood work) drawn today and your tests are completely normal, you will receive your results only by: [name] MyChart Message (if you have MyChart) OR . A paper copy in the mail If you have any lab test that is abnormal or we need to change your treatment, we will call you to review the results.  Testing/Procedures: Dr.Acharya  has requested that you have an echocardiogram. Echocardiography is a painless test that uses sound waves to create images of your heart. It provides your doctor with information about the size and shape of your heart and how well your heart's chambers and valves are working. This procedure takes approximately one hour. There are no restrictions for this procedure.    Follow-Up: At [facility], you and your health needs are our priority.  As part of our continuing mission to provide you with exceptional heart care, we have created designated Provider Care Teams.  These Care Teams include your primary Cardiologist (physician) and Advanced Practice Providers (APPs -  Physician Assistants and Nurse Practitioners) who all work together to provide you with the care you need, when you need it. You will need a follow up appointment in 3 months.  Please call our office 2 months in advance to schedule this appointment.  You may see Dr.Acharya or one of the following Advanced Practice Providers on your designated Care Team:   Rosaria Ferries, PA-C . Jory Sims, DNP, ANP  Any Other Special Instructions Will Be Listed Below (If Applicable).  Dr.Acharya recommends KNEE HIGH COMPRESSION STOCKINGS  -- 20-38mmHg (compression strength)  -- [facility]  -- [address] [place]  -- [place]   -- [place]  -- [address] [place]  -- [phone]   How to Use Compression Stockings Compression stockings are elastic socks that squeeze the legs. They help to increase blood flow to the legs, decrease swelling in the legs, and reduce the chance of developing blood clots in the lower legs. Compression stockings are often used by people who:  Are recovering from surgery.  Have poor circulation in their legs.  Are prone to getting blood clots in their legs.  Have varicose veins.  Sit or stay in bed for long periods of time. How to use compression stockings Before you put on your compression stockings:  Make sure that they are the correct size. If you do not know your size, ask your health care provider.  Make sure that they are clean, dry, and in good condition.  Check them for rips and tears. Do not put them on if they are ripped or torn. Put your stockings on first thing in the morning, before you get out of bed. Keep them on for as long as your health care provider advises. When you are wearing your stockings:  Keep them as smooth as possible. Do not allow them to bunch up. It is especially important to prevent the stockings from bunching up around your toes or behind your knees.  Do not roll the stockings downward and leave them rolled down. This can decrease blood flow to  your leg.  Change them right away if they become wet or dirty. When you take off your stockings, inspect your legs and feet. Anything that does not seem normal may require medical attention. Look for:  Open sores.  Red spots.  Swelling. Information and tips  Do not stop wearing your compression stockings without talking to your health care provider first.  Wash your stockings every day with mild detergent in cold or warm water. Do  not use bleach. Air-dry your stockings or dry them in a clothes dryer on low heat.  Replace your stockings every 3-6 months.  If skin moisturizing is part of your treatment plan, apply lotion or cream at night so that your skin will be dry when you put on the stockings in the morning. It is harder to put the stockings on when you have lotion on your legs or feet. Contact a health care provider if: Remove your stockings and seek medical care if:  You have a feeling of pins and needles in your feet or legs.  You have any new changes in your skin.  You have skin lesions that are getting worse.  You have swelling or pain that is getting worse. Get help right away if:  You have numbness or tingling in your lower legs that does not get better right after you take the stockings off.  Your toes or feet become cold and blue.  You develop open sores or red spots on your legs that do not go away.  You see or feel a warm spot on your leg.  You have new swelling or soreness in your leg.  You are short of breath or you have chest pain for no reason.  You have a rapid or irregular heartbeat.  You feel light-headed or dizzy. This information is not intended to replace advice given to you by your health care provider. Make sure you discuss any questions you have with your health care provider. Document Released: 12/14/2008 Document Revised: 07/17/2015 Document Reviewed: 01/24/2014 Elsevier Interactive Patient Education  2017 [employer].

## 2018-04-25 NOTE — Progress Notes (Signed)
Cardiology Office Note:    Date:  04/25/2018   ID:  Julie Boone, DOB [birth year], MRN [id number]  PCP:  Burnis Medin, MD  Cardiologist:  No primary care provider on file.  Electrophysiologist:  None   Referring MD: Burnis Medin, MD   Lower extremity edema and left sided chest pain; recent PE  History of Present Illness:    Julie Boone is a 85 y.o. female with a hx of left breast cancer with mastectomy and negative follow-up thus far, recent diagnosis of pulmonary embolism, hypertension who presents today for left-sided chest discomfort and lower extremity edema.  She notes that for 6 to 7 months she has had a discomfort in the left side of her chest.  At first she was worried that it was recurrent breast cancer.  She has received a call for follow-up mammogram for her right breast as part of her annual screening, but has not made this appointment yet.  She feels that her discomfort in her chest is nonexertional.  She is not as active as she used to be however she can walk for 3 minutes at a time with her walker around the house and she gardens and does her own dishes.  She completes likely approximately 4 METS of exertion daily, and does not note any chest discomfort with this.  However when she is sitting quietly she will notice some discomfort in the left side of her chest.  She denies significant shortness of breath or palpitations.  Her chest discomfort will last for several minutes and she cannot identify any particular relieving factors.  It is not exacerbated by deep inspiration.  She endorses bilateral lower extremity swelling.  She used to wear compression stockings but then was recently diagnosed with pulmonary embolism, and thought that wearing compression stockings would make things worse so she stopped wearing her stockings.  She did note that while wearing compression stockings her lower extremity edema was much improved.  She also notes paresthesias in  dysphasias of her feet.  She does not carry diagnosis of diabetes.  The patient denies palpitations, PND, orthopnea. Denies syncope or presyncope. Denies dizziness or lightheadedness. Denies snoring and has not be evaluated for sleep apnea.  Past Medical History:  Diagnosis Date  . Acute sinusitis with symptoms greater than 10 days 06/23/2013  . Arthritis   . Breast cancer ([place])    mastectomy 3   neg ln   . HOH (hard of hearing)   . HTN (hypertension)   . Wears glasses   . Wears hearing aid    both ears    Past Surgical History:  Procedure Laterality Date  . APPENDECTOMY  2006  . BREAST BIOPSY Left 2012  . COLONOSCOPY    . HERNIA REPAIR    . INSERTION OF MESH N/A 09/05/2013   Procedure: INSERTION OF MESH;  Surgeon: Joyice Faster. Cornett, MD;  Location: [place];  Service: General;  Laterality: N/A;  . MASTECTOMY Left 2013  . TONSILLECTOMY AND ADENOIDECTOMY     Childhood  . UMBILICAL HERNIA REPAIR N/A 09/05/2013   Procedure: HERNIA REPAIR UMBILICAL ADULT WITH MESH;  Surgeon: Joyice Faster. Cornett, MD;  Location: [place];  Service: General;  Laterality: N/A;    Current Medications: Current Meds  Medication Sig  . atenolol (TENORMIN) 50 MG tablet TAKE 1 AND 1/2 TABLETS EVERY DAY  . lisinopril (PRINIVIL,ZESTRIL) 40 MG tablet Take 1 tablet (40 mg total) by mouth daily.  [name]  meclizine (ANTIVERT) 25 MG tablet 1/2 to 1  po up to every 8 hours as needed for vertigo  Fall precautions  . Rivaroxaban 15 & 20 MG TBPK Take 15 mg by mouth 2 (two) times daily. Take as directed on package: Start with one 15mg  tablet by mouth twice a day with food. On Day 22, switch to one 20mg  tablet once a day with food.     Allergies:   Morphine and related   Social History   Socioeconomic History  . Marital status: Widowed    Spouse name: Not on file  . Number of children: Not on file  . Years of education: Not on file  . Highest education level: Not on file  Occupational  History  . Not on file  Social Needs  . Financial resource strain: Not on file  . Food insecurity:    Worry: Not on file    Inability: Not on file  . Transportation needs:    Medical: Not on file    Non-medical: Not on file  Tobacco Use  . Smoking status: Never Smoker  . Smokeless tobacco: Never Used  Substance and Sexual Activity  . Alcohol use: Yes    Alcohol/week: 0.0 standard drinks    Comment: 1 time a month. has a sip ever now and then   . Drug use: No  . Sexual activity: Not on file  Lifestyle  . Physical activity:    Days per week: Not on file    Minutes per session: Not on file  . Stress: Not on file  Relationships  . Social connections:    Talks on phone: Not on file    Gets together: Not on file    Attends religious service: Not on file    Active member of club or organization: Not on file    Attends meetings of clubs or organizations: Not on file    Relationship status: Not on file  Other Topics Concern  . Not on file  Social History Narrative   Usually receives 5 hours of sleep per night   2people living in the home   Puppy  [place] and [place]   From [place] moved from [place]  Adopted daughter nearby.      Neg ets  Wine with meals  No tob rd.   G0P0   Married husband [place] background  [place] descent.   Husband passed CHF and prostate cancer 2017   Now living with daughter           Family History: The patient's family history includes Cancer in her sister; Colon cancer in her father; Heart failure in her mother; Hypertension in her mother; Stroke in her mother.  ROS:   Please see the history of present illness.    All other systems reviewed and are negative.  EKGs/Labs/Other Studies Reviewed:    The following studies were reviewed today:  EKG: Normal sinus rhythm, left axis deviation, ventricular rate 62 bpm  Recent Labs: 03/30/2018: ALT 13; BUN 31; Creatinine, Ser 1.11; Hemoglobin 14.6; Platelets 250.0; Potassium 4.5; Sodium  136 04/01/2018: B Natriuretic Peptide 84.9  Recent Lipid Panel    Component Value Date/Time   CHOL 204 (H) 10/31/2014 1105   TRIG 269.0 (H) 10/31/2014 1105   HDL 32.60 (L) 10/31/2014 1105   CHOLHDL 6 10/31/2014 1105   VLDL 53.8 (H) 10/31/2014 1105   LDLDIRECT 110.0 10/31/2014 1105    Physical Exam:    VS:  BP (!) 160/78 (BP  Location: Right Arm)   Pulse 64   Ht 5' 5.5" (1.664 m)   Wt 169 lb 6.4 oz (76.8 kg)   SpO2 98%   BMI 27.76 kg/m     Wt Readings from Last 3 Encounters:  04/25/18 169 lb 6.4 oz (76.8 kg)  04/05/18 164 lb 8 oz (74.6 kg)  03/30/18 168 lb (76.2 kg)     Constitutional: No acute distress Cardiovascular: regular rhythm, normal rate, no murmurs. S1 and S2 normal. Radial pulses normal bilaterally. No jugular venous distention.  Respiratory: clear to auscultation bilaterally GI : normal bowel sounds, soft and nontender. No distention.   MSK: extremities warm, well perfused.  2+ bilateral edema right greater than left.  NEURO: grossly nonfocal exam, moves all extremities. PSYCH: alert and oriented x 3, normal mood and affect.   ASSESSMENT:    1. Chest pain, unspecified type   2. Acute pulmonary embolism without acute cor pulmonale, unspecified pulmonary embolism type (HCC)   3. Leg edema    PLAN:     Her chest discomfort seems atypical and possibly even noncardiac.  However with her recent history of PE I would like to rule out RV dysfunction as a source of her chest discomfort or worsening heart failure.  We will check an echocardiogram.  We have discussed stress testing in the risks and benefits and sequela of stress testing.  She would not like to pursue stress testing at this time, and we participated in shared decision making.  She is primarily concerned about affording Xarelto since it is quite expensive for her.  It would be reasonable for her to consider taking Eliquis, and if her GFR is normal, we would be happy to provide transitional samples for her  to convert to Eliquis as she is going to run out of her Xarelto in several days.  However given that this is a noncardiac indication for her anticoagulation I would prefer that it is managed primarily by her primary care physician for duration of therapy.  She plans to meet with her primary care physician in the next several weeks and I have encouraged her to discuss this with her primary care doctor.  I do see that she had been prescribed Eliquis in 2017 and she brings that bottle with her today.  I discouraged her from using the pills in the bottle from 2017 as we do not know there effectiveness after several years.  However the indication for this therapy is not clear to me and may impact duration of therapy for anticoagulation ongoing.  We will follow-up the results of testing and I will see her back in 3 months time.  Medication Adjustments/Labs and Tests Ordered: Current medicines are reviewed at length with the patient today.  Concerns regarding medicines are outlined above.  Orders Placed This Encounter  Procedures  . Compression stockings  . Basic metabolic panel  . EKG 12-Lead  . ECHOCARDIOGRAM COMPLETE   Meds ordered this encounter  Medications  . apixaban (ELIQUIS) 5 MG TABS tablet    Sig: Take 1 tablet (5 mg total) by mouth 2 (two) times daily.    Patient Instructions  Medication Instructions:  Your physician has recommended you make the following change in your medication:   Plan to switch to Elquis after lab results are available.  If you need a refill on your cardiac medications before your next appointment, please call your pharmacy.   Lab work: Art gallery manager today If you have labs (blood work) drawn today and  your tests are completely normal, you will receive your results only by: [name] MyChart Message (if you have MyChart) OR . A paper copy in the mail If you have any lab test that is abnormal or we need to change your treatment, we will call you to review the  results.  Testing/Procedures: Dr.Carime Dinkel  has requested that you have an echocardiogram. Echocardiography is a painless test that uses sound waves to create images of your heart. It provides your doctor with information about the size and shape of your heart and how well your heart's chambers and valves are working. This procedure takes approximately one hour. There are no restrictions for this procedure.    Follow-Up: At [facility], you and your health needs are our priority.  As part of our continuing mission to provide you with exceptional heart care, we have created designated Provider Care Teams.  These Care Teams include your primary Cardiologist (physician) and Advanced Practice Providers (APPs -  Physician Assistants and Nurse Practitioners) who all work together to provide you with the care you need, when you need it. You will need a follow up appointment in 3 months.  Please call our office 2 months in advance to schedule this appointment.  You may see Dr.Shayon Trompeter or one of the following Advanced Practice Providers on your designated Care Team:   Rosaria Ferries, PA-C . Jory Sims, DNP, ANP  Any Other Special Instructions Will Be Listed Below (If Applicable).  Dr.Istvan Behar recommends KNEE HIGH COMPRESSION STOCKINGS  -- 20-55mmHg (compression strength)  -- [facility]  -- [address]  -- [place]   -- [place]  -- [address] [place]  -- [phone]   How to Use Compression Stockings Compression stockings are elastic socks that squeeze the legs. They help to increase blood flow to the legs, decrease swelling in the legs, and reduce the chance of developing blood clots in the lower legs. Compression stockings are often used by people who:  Are recovering from surgery.  Have poor circulation in their legs.  Are prone to  getting blood clots in their legs.  Have varicose veins.  Sit or stay in bed for long periods of time. How to use compression stockings Before you put on your compression stockings:  Make sure that they are the correct size. If you do not know your size, ask your health care provider.  Make sure that they are clean, dry, and in good condition.  Check them for rips and tears. Do not put them on if they are ripped or torn. Put your stockings on first thing in the morning, before you get out of bed. Keep them on for as long as your health care provider advises. When you are wearing your stockings:  Keep them as smooth as possible. Do not allow them to bunch up. It is especially important to prevent the stockings from bunching up around your toes or behind your knees.  Do not roll the stockings downward and leave them rolled down. This can decrease blood flow to your leg.  Change them right away if they become wet or dirty. When you take off your stockings, inspect your legs and feet. Anything that does not seem normal may require medical attention. Look for:  Open sores.  Red spots.  Swelling. Information and tips  Do not stop wearing your compression stockings without talking to your  health care provider first.  Wash your stockings every day with mild detergent in cold or warm water. Do not use bleach. Air-dry your stockings or dry them in a clothes dryer on low heat.  Replace your stockings every 3-6 months.  If skin moisturizing is part of your treatment plan, apply lotion or cream at night so that your skin will be dry when you put on the stockings in the morning. It is harder to put the stockings on when you have lotion on your legs or feet. Contact a health care provider if: Remove your stockings and seek medical care if:  You have a feeling of pins and needles in your feet or legs.  You have any new changes in your skin.  You have skin lesions that are getting  worse.  You have swelling or pain that is getting worse. Get help right away if:  You have numbness or tingling in your lower legs that does not get better right after you take the stockings off.  Your toes or feet become cold and blue.  You develop open sores or red spots on your legs that do not go away.  You see or feel a warm spot on your leg.  You have new swelling or soreness in your leg.  You are short of breath or you have chest pain for no reason.  You have a rapid or irregular heartbeat.  You feel light-headed or dizzy. This information is not intended to replace advice given to you by your health care provider. Make sure you discuss any questions you have with your health care provider. Document Released: 12/14/2008 Document Revised: 07/17/2015 Document Reviewed: 01/24/2014 Elsevier Interactive Patient Education  2017 [place], Elouise Munroe, MD  04/25/2018 1:20 PM    [facility]

## 2018-04-26 ENCOUNTER — Other Ambulatory Visit: Payer: Self-pay | Admitting: Internal Medicine

## 2018-04-26 DIAGNOSIS — Z1231 Encounter for screening mammogram for malignant neoplasm of breast: Secondary | ICD-10-CM

## 2018-04-26 LAB — BASIC METABOLIC PANEL
BUN/Creatinine Ratio: 18 (ref 12–28)
BUN: 15 mg/dL (ref 10–36)
CHLORIDE: 108 mmol/L — AB (ref 96–106)
CO2: 22 mmol/L (ref 20–29)
Calcium: 9.6 mg/dL (ref 8.7–10.3)
Creatinine, Ser: 0.85 mg/dL (ref 0.57–1.00)
GFR calc non Af Amer: 60 mL/min/{1.73_m2} (ref >59)
GFR, EST AFRICAN AMERICAN: 69 mL/min/{1.73_m2} (ref >59)
Glucose: 104 mg/dL — ABNORMAL HIGH (ref 65–99)
Potassium: 5 mmol/L (ref 3.5–5.2)
Sodium: 144 mmol/L (ref 134–144)

## 2018-04-27 ENCOUNTER — Other Ambulatory Visit: Payer: Self-pay | Admitting: Internal Medicine

## 2018-04-27 ENCOUNTER — Other Ambulatory Visit: Payer: Self-pay

## 2018-04-27 MED ORDER — APIXABAN 5 MG PO TABS: 5.0000 mg | ORAL_TABLET | Freq: Two times a day (BID) | ORAL | Status: DC

## 2018-05-04 ENCOUNTER — Ambulatory Visit (HOSPITAL_COMMUNITY): Payer: Medicare Other | Attending: Cardiology

## 2018-05-04 DIAGNOSIS — R079 Chest pain, unspecified: Secondary | ICD-10-CM | POA: Insufficient documentation

## 2018-05-04 DIAGNOSIS — I2699 Other pulmonary embolism without acute cor pulmonale: Secondary | ICD-10-CM | POA: Insufficient documentation

## 2018-05-16 ENCOUNTER — Other Ambulatory Visit: Payer: Self-pay | Admitting: Internal Medicine

## 2018-05-16 MED ORDER — APIXABAN 5 MG PO TABS: 5.0000 mg | ORAL_TABLET | Freq: Two times a day (BID) | ORAL | 10 refills | Status: DC

## 2018-05-16 NOTE — Telephone Encounter (Signed)
New message     *STAT* If patient is at the pharmacy, call can be transferred to refill team.   1. Which medications need to be refilled? (please list name of each medication and dose if known) Eliquis 5 mg by mouth twice daily  2. Which pharmacy/location (including street and city if local pharmacy) is medication [facility] sent to?[facility] [place] [place],   3. Do they need a 30 day or 90 day supply?[place]

## 2018-05-16 NOTE — Telephone Encounter (Signed)
Pt is a 85 yr old female who saw Dr. Margaretann Loveless on 04/15/18, wt. at that time was 76.8Kg. SCr on 04/25/18 was 0.85. Will refill Eliquis 5mg  BID.

## 2018-05-18 ENCOUNTER — Telehealth: Payer: Self-pay | Admitting: Internal Medicine

## 2018-05-18 NOTE — Telephone Encounter (Signed)
Patient calling the office for samples of medication:   1.  What medication and dosage are you requesting samples for? Eliquis- until her mail order comes in s  2.  Are you currently out of this medication? yes

## 2018-05-18 NOTE — Telephone Encounter (Signed)
Called patient, advised that samples were placed up front.  Patient verbalized understanding.

## 2018-05-20 ENCOUNTER — Ambulatory Visit: Payer: Medicare Other

## 2018-05-23 MED ORDER — APIXABAN 5 MG PO TABS: 5.0000 mg | ORAL_TABLET | Freq: Two times a day (BID) | ORAL | 1 refills | Status: DC

## 2018-05-23 NOTE — Telephone Encounter (Signed)
Julie Boone  Please send in send in 90 days or eliquis 5 mg bid  Refill x 1 to her mail away  ( dx pulmonary embolus)    Will DC  The rivoroxaban  Reason :change in therapy  Notify patient   Or [name] her daughter

## 2018-05-31 ENCOUNTER — Ambulatory Visit: Payer: Medicare Other | Admitting: Internal Medicine

## 2018-07-26 ENCOUNTER — Telehealth: Payer: Self-pay | Admitting: Internal Medicine

## 2018-07-26 NOTE — Telephone Encounter (Signed)
Virtual visit/smart phone/call daughter,Barbara @[phone]/my chart/consent obtained -- ttf

## 2018-07-27 ENCOUNTER — Encounter: Payer: Self-pay | Admitting: Internal Medicine

## 2018-07-27 ENCOUNTER — Telehealth (INDEPENDENT_AMBULATORY_CARE_PROVIDER_SITE_OTHER): Payer: Medicare Other | Admitting: Internal Medicine

## 2018-07-27 VITALS — BP 139/68 | HR 65 | Ht 65.5 in | Wt 168.9 lb

## 2018-07-27 DIAGNOSIS — R600 Localized edema: Secondary | ICD-10-CM | POA: Diagnosis not present

## 2018-07-27 DIAGNOSIS — I10 Essential (primary) hypertension: Secondary | ICD-10-CM

## 2018-07-27 NOTE — Patient Instructions (Signed)
Medication Instructions:  Your Physician recommend you continue on your current medication as directed.    If you need a refill on your cardiac medications before your next appointment, please call your pharmacy.   Lab work: None   Testing/Procedures: None  Follow-Up: At [facility], you and your health needs are our priority.  As part of our continuing mission to provide you with exceptional heart care, we have created designated Provider Care Teams.  These Care Teams include your primary Cardiologist (physician) and Advanced Practice Providers (APPs -  Physician Assistants and Nurse Practitioners) who all work together to provide you with the care you need, when you need it. You will need a follow up appointment in 6 months.  Please call our office 2 months in advance to schedule this appointment.  You may see Elouise Munroe, MD or one of the following Advanced Practice Providers on your designated Care Team:   Rosaria Ferries, PA-C . Jory Sims, DNP, ANP

## 2018-07-27 NOTE — Progress Notes (Signed)
Virtual Visit via Video Note   This visit type was conducted due to national recommendations for restrictions regarding the COVID-19 Pandemic (e.g. social distancing) in an effort to limit this patient's exposure and mitigate transmission in our community.  Due to her co-morbid illnesses, this patient is at least at moderate risk for complications without adequate follow up.  This format is felt to be most appropriate for this patient at this time.  All issues noted in this document were discussed and addressed.  A limited physical exam was performed with this format.  Please refer to the patient's chart for her consent to telehealth for [facility].   Date:  07/27/2018   ID:  Julie Boone, DOB [birth year], MRN [id number]  Patient Location: Home Provider Location: Office  PCP:  Burnis Medin, MD  Cardiologist:  Elouise Munroe, MD  Electrophysiologist:  None   Evaluation Performed:  Follow-Up Visit  Chief Complaint:  F/u lower extremity edema  History of Present Illness:    Julie Boone is a 85 y.o. female with a hx of left breast cancer with mastectomy and negative follow-up thus far, recent diagnosis of pulmonary embolism, hypertension.   She is doing well and has no recurrent PE symptoms. Her AC is being managed by primary care, and she is having no concerning bleeding symptoms. She has faithfully worn compression stocking and feels that these help her lower extremity swelling. She has no concerns today and overall feels quite well. Her daughter was present for the telehealth visit today.   The patient denies chest pain, chest pressure, dyspnea at rest or with exertion, palpitations, PND, orthopnea, or worsening leg swelling. Denies syncope or presyncope. Denies dizziness or lightheadedness.   The patient does not have symptoms concerning for COVID-19 infection (fever, chills, cough, or new shortness of breath).    Past Medical History:  Diagnosis Date  .  Acute sinusitis with symptoms greater than 10 days 06/23/2013  . Arthritis   . Breast cancer ([place])    mastectomy 3   neg ln   . HOH (hard of hearing)   . HTN (hypertension)   . Wears glasses   . Wears hearing aid    both ears   Past Surgical History:  Procedure Laterality Date  . APPENDECTOMY  2006  . BREAST BIOPSY Left 2012  . COLONOSCOPY    . HERNIA REPAIR    . INSERTION OF MESH N/A 09/05/2013   Procedure: INSERTION OF MESH;  Surgeon: Joyice Faster. Cornett, MD;  Location: [place];  Service: General;  Laterality: N/A;  . MASTECTOMY Left 2013  . TONSILLECTOMY AND ADENOIDECTOMY     Childhood  . UMBILICAL HERNIA REPAIR N/A 09/05/2013   Procedure: HERNIA REPAIR UMBILICAL ADULT WITH MESH;  Surgeon: Joyice Faster. Cornett, MD;  Location: [place];  Service: General;  Laterality: N/A;     Current Meds  Medication Sig  . apixaban (ELIQUIS) 5 MG TABS tablet Take 1 tablet (5 mg total) by mouth 2 (two) times daily.  [name] atenolol (TENORMIN) 50 MG tablet TAKE 1 AND 1/2 TABLETS EVERY DAY (Patient taking differently: Take 50 mg by mouth daily. TAKE 1 AND 1/2 TABLETS EVERY DAY)  . Cholecalciferol (VITAMIN D-3) 125 MCG (5000 UT) TABS Take 1 tablet by mouth daily.  [name] lisinopril (PRINIVIL,ZESTRIL) 40 MG tablet TAKE 1 TABLET BY MOUTH EVERY DAY  . meclizine (ANTIVERT) 25 MG tablet 1/2 to 1  po up to every 8 hours as  needed for vertigo  Fall precautions  . OVER THE COUNTER MEDICATION Take 5 mLs by mouth daily.     Allergies:   Morphine and related   Social History   Tobacco Use  . Smoking status: Never Smoker  . Smokeless tobacco: Never Used  Substance Use Topics  . Alcohol use: Yes    Alcohol/week: 0.0 standard drinks    Comment: 1 time a month. has a sip ever now and then   . Drug use: No     Family Hx: The patient's family history includes Cancer in her sister; Colon cancer in her father; Heart failure in her mother; Hypertension in her mother; Stroke in her mother.   ROS:   Please see the history of present illness.     All other systems reviewed and are negative.   Prior CV studies:   The following studies were reviewed today:  none  Labs/Other Tests and Data Reviewed:    EKG:  No ECG reviewed.  Recent Labs: 03/30/2018: ALT 13; Hemoglobin 14.6; Platelets 250.0 04/01/2018: B Natriuretic Peptide 84.9 04/25/2018: BUN 15; Creatinine, Ser 0.85; Potassium 5.0; Sodium 144   Recent Lipid Panel Lab Results  Component Value Date/Time   CHOL 204 (H) 10/31/2014 11:05 AM   TRIG 269.0 (H) 10/31/2014 11:05 AM   HDL 32.60 (L) 10/31/2014 11:05 AM   CHOLHDL 6 10/31/2014 11:05 AM   LDLDIRECT 110.0 10/31/2014 11:05 AM    Wt Readings from Last 3 Encounters:  07/27/18 168 lb 14.4 oz (76.6 kg)  04/25/18 169 lb 6.4 oz (76.8 kg)  04/05/18 164 lb 8 oz (74.6 kg)     Objective:    Vital Signs:  BP 139/68   Pulse 65   Ht 5' 5.5" (1.664 m)   Wt 168 lb 14.4 oz (76.6 kg)   SpO2 98%   BMI 27.68 kg/m    VITAL SIGNS:  reviewed GEN:  no acute distress EYES:  sclerae anicteric, EOMI - Extraocular Movements Intact RESPIRATORY:  normal respiratory effort, symmetric expansion CARDIOVASCULAR:  no peripheral edema SKIN:  no rash, lesions or ulcers. MUSCULOSKELETAL:  no obvious deformities. NEURO:  alert and oriented x 3, no obvious focal deficit PSYCH:  normal affect  ASSESSMENT & PLAN:    1. Leg edema   2. Essential hypertension    LE swelling is stable, with compression stockings.   Recent PE - managed by PCP, stable on apixaban without bleeding.   HTN - stable on atenolol and lisinopril. We have previously discussed the renal clearance of atenolol, however she was not eager to make a change since she is feeling well and stable on this medicine. We participated in shared decision making.   COVID-19 Education: The signs and symptoms of COVID-19 were discussed with the patient and how to seek care for testing (follow up with PCP or arrange E-visit).   The importance of social distancing was discussed today.  Time:   Today, I have spent 15 minutes with the patient with telehealth technology discussing the above problems.     Medication Adjustments/Labs and Tests Ordered: Current medicines are reviewed at length with the patient today.  Concerns regarding medicines are outlined above.   Tests Ordered: No orders of the defined types were placed in this encounter.   Medication Changes: No orders of the defined types were placed in this encounter.   Disposition:  Follow up in 6 month(s)  Signed, Elouise Munroe, MD  07/27/2018 4:46 PM    [facility] 

## 2018-10-06 ENCOUNTER — Telehealth: Payer: Self-pay | Admitting: Internal Medicine

## 2018-10-06 ENCOUNTER — Other Ambulatory Visit: Payer: Self-pay

## 2018-10-06 MED ORDER — LISINOPRIL 40 MG PO TABS: 40.0000 mg | ORAL_TABLET | Freq: Every day | ORAL | 0 refills | Status: DC

## 2018-10-06 MED ORDER — ATENOLOL 50 MG PO TABS: 50.0000 mg | ORAL_TABLET | Freq: Every day | ORAL | 1 refills | Status: DC

## 2018-10-06 NOTE — Telephone Encounter (Signed)
Medication Refill - Medication: atenolol (TENORMIN) 50 MG tablet  Has the patient contacted their pharmacy? No. (Agent: If no, request that the patient contact the pharmacy for the refill.) (Agent: If yes, when and what did the pharmacy advise?)  Preferred Pharmacy (with phone number or street name):  [facility] [place] [place] [phone] (Phone) [phone] (Fax)     Agent: Please be advised that RX refills may take up to 3 business days. We ask that you follow-up with your pharmacy.

## 2018-10-06 NOTE — Telephone Encounter (Signed)
See note

## 2018-10-06 NOTE — Telephone Encounter (Signed)
Refill sent in notified through mychart message

## 2018-10-17 ENCOUNTER — Other Ambulatory Visit: Payer: Self-pay

## 2018-10-17 DIAGNOSIS — Z20822 Contact with and (suspected) exposure to covid-19: Secondary | ICD-10-CM

## 2018-10-19 LAB — NOVEL CORONAVIRUS, NAA: SARS-CoV-2, NAA: NOT DETECTED

## 2018-10-21 NOTE — Progress Notes (Signed)
Chief Complaint  Patient presents with  . Abdominal Pain    pt states that its upper abdomen and shooting pains on the sides.     HPI: Julie Boone 85 y.o. come in fostomach pain off and on for about 6 weeks or so  No fever  Vomiting but serious pain  In initially lower abd and now upper abd  bilateral        Also has had chest mid pressure without assoc sx    And pressure in chest with this. Looking at TV  And   ? Stress.  Cause  Raising head with pillow made sx go away   No assoc sob cough fever syncope. Hx PE  And dvt? 2 2020   BHT Tried    Lactose    Free  Not that helpful and trying a   No advil aleve  No bleeding  Vomiting   Has choronic consitatption  And ocass hemorroids.   No fever   Hurts  3/4   Time gets pain .   Sister  Disc digestive enzyme  And helped . Some    Lots of gas and belching a lot and a lot of gas.   ROS: See pertinent positives and negatives per HPI. No weight loss dysphagia  No bleeding  On  Anticoagulation    Had episode of dysuria  And frequency now better   Past Medical History:  Diagnosis Date  . Acute sinusitis with symptoms greater than 10 days 06/23/2013  . Arthritis   . Breast cancer ([place])    mastectomy 3   neg ln   . HOH (hard of hearing)   . HTN (hypertension)   . Wears glasses   . Wears hearing aid    both ears    Family History  Problem Relation Age of Onset  . Hypertension Mother   . Stroke Mother   . Heart failure Mother   . Cancer Sister        lung   . Colon cancer Father     Social History   Socioeconomic History  . Marital status: Widowed    Spouse name: Not on file  . Number of children: Not on file  . Years of education: Not on file  . Highest education level: Not on file  Occupational History  . Not on file  Social Needs  . Financial resource strain: Not on file  . Food insecurity    Worry: Not on file    Inability: Not on file  . Transportation needs    Medical: Not on file    Non-medical: Not on  file  Tobacco Use  . Smoking status: Never Smoker  . Smokeless tobacco: Never Used  Substance and Sexual Activity  . Alcohol use: Yes    Alcohol/week: 0.0 standard drinks    Comment: 1 time a month. has a sip ever now and then   . Drug use: No  . Sexual activity: Not on file  Lifestyle  . Physical activity    Days per week: Not on file    Minutes per session: Not on file  . Stress: Not on file  Relationships  . Social Herbalist on phone: Not on file    Gets together: Not on file    Attends religious service: Not on file    Active member of club or organization: Not on file    Attends meetings of clubs or organizations: Not on file  Relationship status: Not on file  Other Topics Concern  . Not on file  Social History Narrative   Usually receives 5 hours of sleep per night   2people living in the home   Puppy  [place] and [place]   From [place] moved from [place]  Adopted daughter nearby.      Neg ets  Wine with meals  No tob rd.   G0P0   Married husband [place] background  Herself [place] descent.   Husband passed CHF and prostate cancer 2017   Now living with daughter          Outpatient Medications Prior to Visit  Medication Sig Dispense Refill  . apixaban (ELIQUIS) 5 MG TABS tablet Take 1 tablet (5 mg total) by mouth 2 (two) times daily. 60 tablet 10  . atenolol (TENORMIN) 50 MG tablet Take 1 tablet (50 mg total) by mouth daily. TAKE 1 AND 1/2 TABLETS EVERY DAY 135 tablet 1  . Cholecalciferol (VITAMIN D-3) 125 MCG (5000 UT) TABS Take 1 tablet by mouth daily.    [name] lisinopril (ZESTRIL) 40 MG tablet Take 1 tablet (40 mg total) by mouth daily. 30 tablet 0  . meclizine (ANTIVERT) 25 MG tablet 1/2 to 1  po up to every 8 hours as needed for vertigo  Fall precautions (Patient taking differently: as needed. 1/2 to 1  po up to every 8 hours as needed for vertigo  Fall precautions) 20 tablet 0  . OVER THE COUNTER MEDICATION Take 5 mLs by mouth daily.     No  facility-administered medications prior to visit.      EXAM:  BP 138/76 (BP Location: Right Arm, Patient Position: Sitting, Cuff Size: Normal)   Pulse 68   Temp 98.3 F (36.8 C) (Temporal)   Wt 170 lb 3.2 oz (77.2 kg)   SpO2 96%   BMI 27.89 kg/m   Body mass index is 27.89 kg/m.  GENERAL: vitals reviewed and listed above, alert, oriented, appears well hydrated and in no acute distress here with duaghter barbara  HEENT: atraumatic, conjunctiva  clear, no obvious abnormalities on inspection of external nose and ears  NECK: no obvious masses on inspection palpation  LUNGS: clear to auscultation bilaterally, no wheezes, rales or rhonchi, good air movement no point  Tenderness  Spine   CV: HRRR, no clubbing cyanosis min   peripheral edema nl cap refill  Abdomen:   normal bowel sounds without hepatosplenomegaly, no guarding rebound or masses no CVA tenderness tender  On upper abd and to luq and mid  No masses felt  MS: moves all extremities without noticeable focal  abnormality PSYCH: pleasant and cooperative, no obvious depression or anxiety hard of hearing and a bit dizziness  When sitting up rapidly  Lab Results  Component Value Date   WBC 12.1 (H) 03/30/2018   HGB 14.6 03/30/2018   HCT 43.9 03/30/2018   PLT 250.0 03/30/2018   GLUCOSE 104 (H) 04/25/2018   CHOL 204 (H) 10/31/2014   TRIG 269.0 (H) 10/31/2014   HDL 32.60 (L) 10/31/2014   LDLDIRECT 110.0 10/31/2014   ALT 13 03/30/2018   AST 14 03/30/2018   NA 144 04/25/2018   K 5.0 04/25/2018   CL 108 (H) 04/25/2018   CREATININE 0.85 04/25/2018   BUN 15 04/25/2018   CO2 22 04/25/2018   TSH 1.56 12/02/2016   INR 0.93 04/01/2018   HGBA1C 6.1 (A) 10/21/2017   BP Readings from Last 3 Encounters:  10/24/18 138/76  07/27/18  139/68  04/25/18 (!) 160/78  abd ct ewas normal 2019  Jan   ASSESSMENT AND PLAN:  Discussed the following assessment and plan:  Pain of upper abdomen - post prandial  without weight loss new onset   diff dx discussed  - Plan: Basic metabolic panel, CBC with Differential/Platelet, Hepatic function panel, TSH, Hemoglobin A1c, Sedimentation rate, POCT Urinalysis Dipstick (Automated), US Abdomen Complete, Ambulatory referral to Gastroenterology  Dyspepsia - Plan: Basic metabolic panel, CBC with Differential/Platelet, Hepatic function panel, TSH, Hemoglobin A1c, Sedimentation rate, POCT Urinalysis Dipstick (Automated), US Abdomen Complete, Ambulatory referral to Gastroenterology  Chest pressure - Plan: Basic metabolic panel, CBC with Differential/Platelet, Hepatic function panel, TSH, Hemoglobin A1c, Sedimentation rate, POCT Urinalysis Dipstick (Automated)  Anticoagulant long-term use - Plan: Ambulatory referral to Gastroenterology  History of pulmonary embolus (PE) - Plan: Ambulatory referral to Gastroenterology Check [place] lab  And   Add ppi  And gi fu to expedite eval    She is   Pretty pain tolerant   Close fu  Advised    Patient Instructions   Plan lab  And add acid blocker  Will arrange   Abdominal ultrasound    Concern for ulcer gastritis  And or gall bladder ...   Problem   Avoiding  Fatty foods for now.   You will be  Contacted about abd [place] and  Gi referral.    Indigestion Indigestion is a feeling of pain, discomfort, burning, or fullness in the upper part of your abdomen. It can come and go. It may occur frequently or rarely. Indigestion tends to occur while you are eating or right after you have finished eating. It may be worse at night and while bending over or lying down. Indigestion may be a symptom of an underlying digestive condition. Follow these instructions at home: Eating and drinking   Follow an eating plan as recommended by your health care provider.  Avoid certain foods and drinks as told by your health care provider. This may include: ? Chocolate and cocoa. ? Peppermint and mint flavorings. ? Garlic and onions. ? Horseradish. ? Spicy and acidic foods,  including peppers, chili powder, curry powder, vinegar, hot sauces, and barbecue sauce. ? Citrus fruits, such as oranges, lemons, and limes. ? Tomato-based foods, such as red sauce, chili, salsa, and pizza with red sauce. ? Fried and fatty foods, such as donuts, french fries, potato chips, and high-fat dressings. ? High-fat meats, such as hot dogs and fatty cuts of red and white meats, such as rib eye steak, sausage, ham, and bacon. ? High-fat dairy items, such as whole milk, butter, and cream cheese. ? Coffee and tea (with or without caffeine). ? Drinks that contain alcohol. ? Energy drinks and sports drinks. ? Carbonated drinks or sodas. ? Citrus fruit juices.  Eat small, frequent meals instead of large meals.  Avoid drinking large amounts of liquid with your meals.  Avoid eating meals during the 2-3 hours before bedtime.  Avoid lying down right after you eat.  Avoid exercise for 2 hours after you eat. Lifestyle      Maintain a healthy weight. Ask your health care provider what weight is healthy for you. If you need to lose weight, work with your health care provider to do so safely.  Exercise for at least 30 minutes on 5 or more days each week, or as told by your health care provider. Avoid exercises that include bending forward. This can make your symptoms worse.  Wear loose-fitting clothing.  Do not wear anything tight around your waist that causes pressure on your abdomen.  Do not use any products that contain nicotine or tobacco, such as cigarettes, e-cigarettes, and chewing tobacco. These can make symptoms worse. If you need help quitting, ask your health care provider.  Raise (elevate) the head of your bed about 6 inches (15 cm) when you sleep.  Try to reduce your stress, such as with yoga or meditation. If you need help reducing stress, ask your health care provider. General instructions  Take over-the-counter and prescription medicines only as told by your health care  provider. Do not take aspirin, ibuprofen, or other NSAIDs unless your health care provider told you to do so.  Pay attention to any changes in your symptoms.  Keep all follow-up visits as told by your health care provider. This is important. Contact a health care provider if:  You have new symptoms.  You have unexplained weight loss.  You have difficulty swallowing, or it hurts to swallow.  Your symptoms do not improve with treatment.  Your symptoms last for more than 2 days.  You have a fever.  You vomit. Get help right away if:  You have pain in your arms, neck, jaw, teeth, or back.  You feel sweaty, dizzy, or light-headed.  You faint.  You have chest pain or shortness of breath.  You cannot stop vomiting, or you vomit blood.  Your stool is bloody or black.  You have severe pain in your abdomen. These symptoms may represent a serious problem that is an emergency. Do not wait to see if the symptoms will go away. Get medical help right away. Call your local emergency services (911 in the [place] Do not drive yourself to the hospital. Summary  Indigestion is a feeling of pain, discomfort, burning, or fullness in the upper part of your abdomen. It tends to occur while you are eating or right after you have finished eating.  Follow an eating plan and other lifestyle changes as told by your health care provider.  Take over-the-counter and prescription medicines only as told by your health care provider. Do not take aspirin, ibuprofen, or other NSAIDs unless your health care provider told you to do so.  Contact your health care provider if your symptoms do not get better or they get worse. Some symptoms may represent a serious problem that is an emergency. Do not wait to see if the symptoms will go away. Get medical help right away. This information is not intended to replace advice given to you by your health care provider. Make sure you discuss any questions you have with  your health care provider. Document Released: 03/26/2004 Document Revised: 07/19/2017 Document Reviewed: 07/19/2017 Elsevier Patient Education  2020 [employer]  M.D.

## 2018-10-24 ENCOUNTER — Other Ambulatory Visit: Payer: Self-pay

## 2018-10-24 ENCOUNTER — Ambulatory Visit (INDEPENDENT_AMBULATORY_CARE_PROVIDER_SITE_OTHER): Payer: Medicare Other | Admitting: Internal Medicine

## 2018-10-24 ENCOUNTER — Encounter: Payer: Self-pay | Admitting: Internal Medicine

## 2018-10-24 VITALS — BP 138/76 | HR 68 | Temp 98.3°F | Wt 170.2 lb

## 2018-10-24 DIAGNOSIS — R1013 Epigastric pain: Secondary | ICD-10-CM

## 2018-10-24 DIAGNOSIS — Z7901 Long term (current) use of anticoagulants: Secondary | ICD-10-CM | POA: Diagnosis not present

## 2018-10-24 DIAGNOSIS — R1010 Upper abdominal pain, unspecified: Secondary | ICD-10-CM | POA: Diagnosis not present

## 2018-10-24 DIAGNOSIS — R0789 Other chest pain: Secondary | ICD-10-CM

## 2018-10-24 DIAGNOSIS — Z86711 Personal history of pulmonary embolism: Secondary | ICD-10-CM

## 2018-10-24 LAB — POC URINALSYSI DIPSTICK (AUTOMATED)
Bilirubin, UA: NEGATIVE
Blood, UA: NEGATIVE
Glucose, UA: NEGATIVE
Ketones, UA: NEGATIVE
Leukocytes, UA: NEGATIVE
Nitrite, UA: NEGATIVE
Protein, UA: NEGATIVE
Spec Grav, UA: >=1.03 — AB (ref 1.010–1.025)
Urobilinogen, UA: 0.2 E.U./dL
pH, UA: 6 (ref 5.0–8.0)

## 2018-10-24 MED ORDER — PANTOPRAZOLE SODIUM 40 MG PO TBEC: 40.0000 mg | DELAYED_RELEASE_TABLET | Freq: Every day | ORAL | 3 refills | Status: DC

## 2018-10-24 NOTE — Patient Instructions (Addendum)
Plan lab  And add acid blocker  Will arrange   Abdominal ultrasound    Concern for ulcer gastritis  And or gall bladder ...   Problem   Avoiding  Fatty foods for now.   You will be  Contacted about abd [place] and  Gi referral.    Indigestion Indigestion is a feeling of pain, discomfort, burning, or fullness in the upper part of your abdomen. It can come and go. It may occur frequently or rarely. Indigestion tends to occur while you are eating or right after you have finished eating. It may be worse at night and while bending over or lying down. Indigestion may be a symptom of an underlying digestive condition. Follow these instructions at home: Eating and drinking   Follow an eating plan as recommended by your health care provider.  Avoid certain foods and drinks as told by your health care provider. This may include: ? Chocolate and cocoa. ? Peppermint and mint flavorings. ? Garlic and onions. ? Horseradish. ? Spicy and acidic foods, including peppers, chili powder, curry powder, vinegar, hot sauces, and barbecue sauce. ? Citrus fruits, such as oranges, lemons, and limes. ? Tomato-based foods, such as red sauce, chili, salsa, and pizza with red sauce. ? Fried and fatty foods, such as donuts, french fries, potato chips, and high-fat dressings. ? High-fat meats, such as hot dogs and fatty cuts of red and white meats, such as rib eye steak, sausage, ham, and bacon. ? High-fat dairy items, such as whole milk, butter, and cream cheese. ? Coffee and tea (with or without caffeine). ? Drinks that contain alcohol. ? Energy drinks and sports drinks. ? Carbonated drinks or sodas. ? Citrus fruit juices.  Eat small, frequent meals instead of large meals.  Avoid drinking large amounts of liquid with your meals.  Avoid eating meals during the 2-3 hours before bedtime.  Avoid lying down right after you eat.  Avoid exercise for 2 hours after you eat. Lifestyle      Maintain a healthy  weight. Ask your health care provider what weight is healthy for you. If you need to lose weight, work with your health care provider to do so safely.  Exercise for at least 30 minutes on 5 or more days each week, or as told by your health care provider. Avoid exercises that include bending forward. This can make your symptoms worse.  Wear loose-fitting clothing. Do not wear anything tight around your waist that causes pressure on your abdomen.  Do not use any products that contain nicotine or tobacco, such as cigarettes, e-cigarettes, and chewing tobacco. These can make symptoms worse. If you need help quitting, ask your health care provider.  Raise (elevate) the head of your bed about 6 inches (15 cm) when you sleep.  Try to reduce your stress, such as with yoga or meditation. If you need help reducing stress, ask your health care provider. General instructions  Take over-the-counter and prescription medicines only as told by your health care provider. Do not take aspirin, ibuprofen, or other NSAIDs unless your health care provider told you to do so.  Pay attention to any changes in your symptoms.  Keep all follow-up visits as told by your health care provider. This is important. Contact a health care provider if:  You have new symptoms.  You have unexplained weight loss.  You have difficulty swallowing, or it hurts to swallow.  Your symptoms do not improve with treatment.  Your symptoms last for more  than 2 days.  You have a fever.  You vomit. Get help right away if:  You have pain in your arms, neck, jaw, teeth, or back.  You feel sweaty, dizzy, or light-headed.  You faint.  You have chest pain or shortness of breath.  You cannot stop vomiting, or you vomit blood.  Your stool is bloody or black.  You have severe pain in your abdomen. These symptoms may represent a serious problem that is an emergency. Do not wait to see if the symptoms will go away. Get medical  help right away. Call your local emergency services (911 in the [place] Do not drive yourself to the hospital. Summary  Indigestion is a feeling of pain, discomfort, burning, or fullness in the upper part of your abdomen. It tends to occur while you are eating or right after you have finished eating.  Follow an eating plan and other lifestyle changes as told by your health care provider.  Take over-the-counter and prescription medicines only as told by your health care provider. Do not take aspirin, ibuprofen, or other NSAIDs unless your health care provider told you to do so.  Contact your health care provider if your symptoms do not get better or they get worse. Some symptoms may represent a serious problem that is an emergency. Do not wait to see if the symptoms will go away. Get medical help right away. This information is not intended to replace advice given to you by your health care provider. Make sure you discuss any questions you have with your health care provider. Document Released: 03/26/2004 Document Revised: 07/19/2017 Document Reviewed: 07/19/2017 Elsevier Patient Education  2020 [employer].

## 2018-10-25 ENCOUNTER — Encounter: Payer: Self-pay | Admitting: Physician Assistant

## 2018-10-25 LAB — CBC WITH DIFFERENTIAL/PLATELET
Basophils Absolute: 0.2 10*3/uL — ABNORMAL HIGH (ref 0.0–0.1)
Basophils Relative: 1.6 % (ref 0.0–3.0)
Eosinophils Absolute: 0.9 10*3/uL — ABNORMAL HIGH (ref 0.0–0.7)
Eosinophils Relative: 8.5 % — ABNORMAL HIGH (ref 0.0–5.0)
HCT: 42.1 % (ref 36.0–46.0)
Hemoglobin: 14.1 g/dL (ref 12.0–15.0)
Lymphocytes Relative: 23.1 % (ref 12.0–46.0)
Lymphs Abs: 2.4 10*3/uL (ref 0.7–4.0)
MCHC: 33.5 g/dL (ref 30.0–36.0)
MCV: 97.4 fl (ref 78.0–100.0)
Monocytes Absolute: 1 10*3/uL (ref 0.1–1.0)
Monocytes Relative: 9.4 % (ref 3.0–12.0)
Neutro Abs: 6 10*3/uL (ref 1.4–7.7)
Neutrophils Relative %: 57.4 % (ref 43.0–77.0)
Platelets: 243 10*3/uL (ref 150.0–400.0)
RBC: 4.32 Mil/uL (ref 3.87–5.11)
RDW: 12.7 % (ref 11.5–15.5)
WBC: 10.5 10*3/uL (ref 4.0–10.5)

## 2018-10-25 LAB — TSH: TSH: 1.52 u[IU]/mL (ref 0.35–4.50)

## 2018-10-25 LAB — HEPATIC FUNCTION PANEL
ALT: 22 U/L (ref 0–35)
AST: 27 U/L (ref 0–37)
Albumin: 4.3 g/dL (ref 3.5–5.2)
Alkaline Phosphatase: 73 U/L (ref 39–117)
Bilirubin, Direct: 0.1 mg/dL (ref 0.0–0.3)
Total Bilirubin: 0.5 mg/dL (ref 0.2–1.2)
Total Protein: 6.6 g/dL (ref 6.0–8.3)

## 2018-10-25 LAB — BASIC METABOLIC PANEL
BUN: 25 mg/dL — ABNORMAL HIGH (ref 6–23)
CO2: 24 mEq/L (ref 19–32)
Calcium: 10 mg/dL (ref 8.4–10.5)
Chloride: 106 mEq/L (ref 96–112)
Creatinine, Ser: 0.94 mg/dL (ref 0.40–1.20)
GFR: 55.62 mL/min — ABNORMAL LOW (ref >60.00)
Glucose, Bld: 107 mg/dL — ABNORMAL HIGH (ref 70–99)
Potassium: 4.1 mEq/L (ref 3.5–5.1)
Sodium: 140 mEq/L (ref 135–145)

## 2018-10-25 LAB — SEDIMENTATION RATE: Sed Rate: 14 mm/hr (ref 0–30)

## 2018-10-25 LAB — HEMOGLOBIN A1C: Hgb A1c MFr Bld: 6.3 % (ref 4.6–6.5)

## 2018-10-28 ENCOUNTER — Other Ambulatory Visit: Payer: Self-pay | Admitting: Internal Medicine

## 2018-11-03 ENCOUNTER — Ambulatory Visit
Admission: RE | Admit: 2018-11-03 | Discharge: 2018-11-03 | Disposition: A | Discharge status: Home / Self Care | Payer: Medicare Other | Source: Ambulatory Visit | Attending: Internal Medicine | Admitting: Internal Medicine

## 2018-11-03 DIAGNOSIS — R1010 Upper abdominal pain, unspecified: Secondary | ICD-10-CM

## 2018-11-03 DIAGNOSIS — R1013 Epigastric pain: Secondary | ICD-10-CM

## 2018-11-08 ENCOUNTER — Encounter: Payer: Self-pay | Admitting: Physician Assistant

## 2018-11-08 ENCOUNTER — Other Ambulatory Visit: Payer: Self-pay

## 2018-11-08 ENCOUNTER — Ambulatory Visit (INDEPENDENT_AMBULATORY_CARE_PROVIDER_SITE_OTHER): Payer: Medicare Other | Admitting: Physician Assistant

## 2018-11-08 VITALS — BP 124/72 | HR 63 | Temp 98.0°F | Ht 65.0 in | Wt 169.0 lb

## 2018-11-08 DIAGNOSIS — R1013 Epigastric pain: Secondary | ICD-10-CM | POA: Diagnosis not present

## 2018-11-08 DIAGNOSIS — G8929 Other chronic pain: Secondary | ICD-10-CM

## 2018-11-08 NOTE — Patient Instructions (Signed)
If you are age 83 or older, your body mass index should be between 23-30. Your Body mass index is 28.12 kg/m. If this is out of the aforementioned range listed, please consider follow up with your Primary Care Provider.  If you are age 69 or younger, your body mass index should be between 19-25. Your Body mass index is 28.12 kg/m. If this is out of the aformentioned range listed, please consider follow up with your Primary Care Provider.   Continue digestive enzymes.  Try gradually decreasing over the next few weeks.  Can try Lac-taid tabs for dairy intake.  Call and ask for Amy's nurse, Beth for any recurrence or worsening symptoms.  Thank you for choosing me and [facility].   Amy Esterwood, PA-C

## 2018-11-09 ENCOUNTER — Encounter: Payer: Self-pay | Admitting: Physician Assistant

## 2018-11-09 NOTE — Progress Notes (Signed)
Subjective:    Patient ID: Julie Boone, female    DOB: 1924/03/07, 85 y.o.   MRN: [id number]  HPI Julie Boone is a very nice 85 year old white female, new to GI today referred by Dr. Regis Bill for evaluation of recent upper abdominal pain and dyspepsia.  Patient has history of breast cancer in 2012, she was diagnosed with a PE in February 2020 and is on Eliquis.  Also with history of hypertension. Patient had prior Colonoscopy done in [place] in 2009 with removal of 3 polyps, 3 to 5 mm-no path report in Epic.  Her current symptoms started about 6 weeks ago.  She says she felt like she had a virus initially with some lower abdominal discomfort and cramping after eating.  She says about 20 minutes after a meal she would notice lower abdominal discomfort.  She also developed some nausea without vomiting, and her daughter says she had a lot of belching and burping on a regular basis.  No fever or chills.  No diarrhea melena or hematochezia.  She started eating smaller amounts and healthier blander foods, cut out snacks and ice cream.  She has lost a few pounds, but attributes this to significant dietary change.  She had also been drinking milk on a regular basis and her daughter started her on Allmond milk.  She also started taking a digestive enzyme supplement which several other family members have been taking says at this point she feels much better over the past couple of weeks.  They did bring the digestive enzyme supplement with him, this contained lipase amylase lactase etc. She denies any abdominal pain at present, and says she feels good.  Her daughter says she is definitely eating smaller amounts but has a very good appetite. She had not been on any regular NSAIDs.  She briefly was given a trial of omeprazole which she said made her feel worse so she stopped it. Labs were done on 10/24/2018, GFR 55, CBC within normal limits other than slightly increased absolute eosinophils at 0.9.  Sed rate  14, LFTs within normal limits. Upper abdominal ultrasound 11/03/2018 showed a fatty liver, no gallstones CBD of 4 mm and pancreas poorly visualized.   Review of Systems Pertinent positive and negative review of systems were noted in the above HPI section.  All other review of systems was otherwise negative.  Outpatient Encounter Medications as of 11/08/2018  Medication Sig  . apixaban (ELIQUIS) 5 MG TABS tablet Take 1 tablet (5 mg total) by mouth 2 (two) times daily.  [name] atenolol (TENORMIN) 50 MG tablet Take 50 mg by mouth daily.  . Cholecalciferol (VITAMIN D-3) 125 MCG (5000 UT) TABS Take 1 tablet by mouth daily.  [name] lisinopril (ZESTRIL) 40 MG tablet TAKE 1 TABLET BY MOUTH EVERY DAY  . meclizine (ANTIVERT) 25 MG tablet 1/2 to 1  po up to every 8 hours as needed for vertigo  Fall precautions (Patient taking differently: as needed. 1/2 to 1  po up to every 8 hours as needed for vertigo  Fall precautions)  . [DISCONTINUED] atenolol (TENORMIN) 50 MG tablet Take 1 tablet (50 mg total) by mouth daily. TAKE 1 AND 1/2 TABLETS EVERY DAY (Patient taking differently: Take 50 mg by mouth daily. )  . [DISCONTINUED] OVER THE COUNTER MEDICATION Take 5 mLs by mouth daily.  . [DISCONTINUED] pantoprazole (PROTONIX) 40 MG tablet Take 1 tablet (40 mg total) by mouth daily.   No facility-administered encounter medications on file as of 11/08/2018.  Allergies  Allergen Reactions  . Morphine And Related Nausea And Vomiting  . Pantoprazole Other (See Comments)    Nausea and dizzy   Patient Active Problem List   Diagnosis Date Noted  . Acute pulmonary embolism without acute cor pulmonale ([place]) 04/05/2018  . Abnormal chest x-ray 09/22/2013  . Allergic rhinitis 06/23/2013  . Wax in ear 06/23/2013  . Abnormal thyroid blood test 04/14/2013  . Elevated blood sugar 04/14/2013  . Decreased hearing 04/14/2013  . Wears hearing aid 03/03/2013  . Essential hypertension 03/03/2013  . Leg cramps 03/03/2013  . Influenza  vaccination declined by patient 03/03/2013  . Pneumococcal vaccination declined by patient 03/03/2013  . Breast cancer [facility])    Social History   Socioeconomic History  . Marital status: Widowed    Spouse name: Not on file  . Number of children: 1  . Years of education: Not on file  . Highest education level: Not on file  Occupational History  . Not on file  Social Needs  . Financial resource strain: Not on file  . Food insecurity    Worry: Not on file    Inability: Not on file  . Transportation needs    Medical: Not on file    Non-medical: Not on file  Tobacco Use  . Smoking status: Never Smoker  . Smokeless tobacco: Never Used  Substance and Sexual Activity  . Alcohol use: Yes    Alcohol/week: 0.0 standard drinks    Comment: 1 time a month. has a sip ever now and then   . Drug use: No  . Sexual activity: Not on file  Lifestyle  . Physical activity    Days per week: Not on file    Minutes per session: Not on file  . Stress: Not on file  Relationships  . Social Herbalist on phone: Not on file    Gets together: Not on file    Attends religious service: Not on file    Active member of club or organization: Not on file    Attends meetings of clubs or organizations: Not on file    Relationship status: Not on file  . Intimate partner violence    Fear of current or ex partner: Not on file    Emotionally abused: Not on file    Physically abused: Not on file    Forced sexual activity: Not on file  Other Topics Concern  . Not on file  Social History Narrative   Usually receives 5 hours of sleep per night   2people living in the home   [place]y  [place] and [place]   From [place] moved from [place]  Adopted daughter nearby.      Neg ets  Wine with meals  No tob rd.   G0P0   Married husband [place] background  Herself [place] descent.   Husband passed CHF and prostate cancer 2017   Now living with daughter          Ms. Ettinger's family history includes  Colon cancer in her father; Heart failure in her mother; Hypertension in her mother; Lung disease in her sister; Stroke in her mother.      Objective:    Vitals:   11/08/18 1508  BP: 124/72  Pulse: 63  Temp: 98 F (36.7 C)    Physical Exam Well-developed well-nourished elderly white female in no acute distress.  Hard of hearing, very pleasant, accompanied by her daughter Weight 169, BMI 28.1  HEENT;  nontraumatic normocephalic, EOMI, PE R LA, sclera anicteric. Oropharynx; not examined/mask/COVID Neck; supple, no JVD Cardiovascular; regular rate and rhythm with S1-S2, no murmur rub or gallop Pulmonary; Clear bilaterally Abdomen; soft, minimally tender in the hypogastrium, nondistended, no palpable mass or hepatosplenomegaly, bowel sounds are active Rectal; not done today Skin; benign exam, no jaundice rash or appreciable lesions Extremities; no clubbing cyanosis or edema skin warm and dry Neuro/Psych; alert and oriented x4, grossly nonfocal mood and affect appropriate, hard of hearing       Assessment & Plan:   #31 85 year old white female with onset about 6 weeks ago with lower abdominal discomfort postprandially followed by nausea, upset stomach and excessive belching and burping. Symptoms significantly improved at this point and by patient report resolved.  She does have some minimal hypogastric tenderness. Patient has altered her diet with smaller blander healthier meals, and discontinued dairy intake.  In addition has added a digestive enzyme supplement which she feels has helped a lot. Recent baseline labs unremarkable and upper abdominal ultrasound negative with exception of poorly visualized pancreas.  Etiology of recent symptoms is not entirely clear.  This may have been an infectious gastroenteritis, or gastritis.  No evidence of gallbladder disease by ultrasound, cannot entirely rule out underlying malignancy, though improvement in symptoms is very reassuring  #2  history of pulmonary embolus February 2020-on Eliquis #3.  Hypertension #4.  Prior history of breast cancer 2012  Plan; I discussed CT of the abdomen and pelvis with patient and her daughter.  As symptoms have improved today would like to hold on that for now. Okay to continue digestive enzyme supplement Gradually advance diet as tolerated.  We discussed component of lactose intolerance and use of Lactaid on a as needed basis.  Patient and daughter were advised to call back should she have any recurrence of previous symptoms, and at that point would proceed with other imaging with CT of the abdomen and pelvis. Patient will be established with Dr. Rush Landmark, I am happy to see her back at any point.  Amy S Esterwood PA-C 11/09/2018   Cc: Burnis Medin, MD

## 2018-11-11 ENCOUNTER — Telehealth: Payer: Self-pay | Admitting: Physician Assistant

## 2018-11-11 ENCOUNTER — Other Ambulatory Visit: Payer: Self-pay | Admitting: Family Medicine

## 2018-11-11 NOTE — Progress Notes (Signed)
Attending Physician's Attestation   I have reviewed the chart.   I agree with the Advanced Practitioner's note, impression, and recommendations with any updates as below.  In the setting of improving symptoms, her age and comorbidities, reasonable to monitor and if any recurrent symptoms agree with proceeding with imaging and then consideration of endoscopic evaluation thereafter if necessary.  Justice Britain, MD  Gastroenterology Advanced Endoscopy Office # [id number]

## 2018-11-11 NOTE — Telephone Encounter (Signed)
Spoke with the patient's daughter. The abdominal pain started coming back yesterday evening. Today it has been more consistent. She can get still in a sitting position and be more comfortable. It is stomach cramps from low abdomen to center of her chest. No associated shortness of breath. Patient is presently playing a video game. She has noted the pain intensifies after eating. There is associated nausea and burping. She is on a low fat dairy free diet. Please advise

## 2018-11-14 NOTE — Telephone Encounter (Signed)
Lets add pepcid 20 mg po BID - can send as Rx #60 /2 We ha discussed CT abd/pelvis with contrast which would be next step

## 2018-11-15 ENCOUNTER — Other Ambulatory Visit: Payer: Self-pay

## 2018-11-15 MED ORDER — FAMOTIDINE 20 MG PO TABS: 20.0000 mg | ORAL_TABLET | Freq: Two times a day (BID) | ORAL | 2 refills | Status: DC

## 2018-11-15 NOTE — Telephone Encounter (Signed)
Spoke with Pamala Hurry. Explained the plan of action. She agrees to have her mother try this first, then if this does not improve her symptoms, call back. [facility] [place].

## 2018-11-23 ENCOUNTER — Telehealth: Payer: Self-pay | Admitting: Physician Assistant

## 2018-11-23 ENCOUNTER — Other Ambulatory Visit: Payer: Self-pay

## 2018-11-23 DIAGNOSIS — R1030 Lower abdominal pain, unspecified: Secondary | ICD-10-CM

## 2018-11-23 NOTE — Telephone Encounter (Signed)
Spoke with the patient's daughter. The patient has eliminated possible problem foods from her diet including fatty or fried foods and dairy. Her symptoms of low abdominal pain that "creeps upward" have continued. Eating worsens the pain. It is sharp, intense and will last about an hour. CT of the abdomen and pelvis with contrast has been ordered per our conversation.  CT is on 11/28/18 at 3pm.  Written instructions provided.

## 2018-11-28 ENCOUNTER — Other Ambulatory Visit: Payer: Self-pay

## 2018-11-28 ENCOUNTER — Ambulatory Visit (HOSPITAL_COMMUNITY)
Admission: RE | Admit: 2018-11-28 | Discharge: 2018-11-28 | Disposition: A | Discharge status: Home / Self Care | Payer: Medicare Other | Source: Ambulatory Visit | Attending: Physician Assistant | Admitting: Physician Assistant

## 2018-11-28 DIAGNOSIS — R1030 Lower abdominal pain, unspecified: Secondary | ICD-10-CM | POA: Insufficient documentation

## 2018-11-28 MED ORDER — IOHEXOL 300 MG/ML  SOLN
100.0000 mL | Freq: Once | INTRAMUSCULAR | Status: AC | PRN
  Administered 2018-11-28: 16:00:00 75 mL via INTRAVENOUS

## 2018-11-28 MED ORDER — SODIUM CHLORIDE (PF) 0.9 % IJ SOLN
INTRAMUSCULAR | Status: AC
  Filled 2018-11-28: qty 50

## 2018-11-30 NOTE — Telephone Encounter (Signed)
Pt called regarding results of CT.  °

## 2018-12-01 ENCOUNTER — Telehealth: Payer: Self-pay | Admitting: Physician Assistant

## 2018-12-01 NOTE — Telephone Encounter (Signed)
Amy please advise on the CT report.

## 2018-12-01 NOTE — Telephone Encounter (Signed)
Advised of this. Patient and daughter are still concerned about what is causing her pain. Pleased that there is no infection.

## 2018-12-01 NOTE — Telephone Encounter (Signed)
Please let pt know the Ct scan was negative- no acute findings , has diverticulosis but no diverticulitis

## 2018-12-01 NOTE — Telephone Encounter (Signed)
Duplicated message.

## 2018-12-02 NOTE — Telephone Encounter (Signed)
Please advise. The patient continues to experience her pain. Comes and goes.Seems to worsen with a meal. Can occur without eating. Tylenol does not help.

## 2018-12-06 NOTE — Telephone Encounter (Signed)
[place]  As per prior note , and start Levsin sl  q 6 hours prn  For attacks - lets get her a follow up appt perhaps [place]  Who she is established with

## 2018-12-06 NOTE — Telephone Encounter (Signed)
She was started on Pepcid 11/15/18. She has been taking this. It has not changed the "attacks."

## 2018-12-06 NOTE — Telephone Encounter (Signed)
Lets let her try pepcid 20 mg BID -can send as  RX , and would like to have her try IB gard as well - can give her samples - take as diretcted on box -try for 2 weeks -  Happy to see her in follow up after that

## 2018-12-07 ENCOUNTER — Other Ambulatory Visit: Payer: Self-pay

## 2018-12-07 MED ORDER — HYOSCYAMINE SULFATE SL 0.125 MG SL SUBL: 1.0000 | SUBLINGUAL_TABLET | Freq: Four times a day (QID) | SUBLINGUAL | 0 refills | Status: DC | PRN

## 2018-12-07 NOTE — Telephone Encounter (Signed)
Spoke with Pamala Hurry the daughter of the patient. Reviewed this plan with her. Samples of IBgard at the front desk for pick up if she would like to try before buying them. [facility] Aware there may be issues with insurance coverage. Appointment with Dr Rush Landmark 01/12/19 at 11:30 am.

## 2018-12-27 ENCOUNTER — Other Ambulatory Visit: Payer: Self-pay | Admitting: Internal Medicine

## 2018-12-29 ENCOUNTER — Other Ambulatory Visit: Payer: Self-pay | Admitting: Physician Assistant

## 2019-01-05 ENCOUNTER — Other Ambulatory Visit: Payer: Self-pay | Admitting: Internal Medicine

## 2019-01-10 ENCOUNTER — Telehealth: Payer: Self-pay | Admitting: *Deleted

## 2019-01-10 MED ORDER — APIXABAN 5 MG PO TABS: 5.0000 mg | ORAL_TABLET | Freq: Two times a day (BID) | ORAL | 0 refills | Status: DC

## 2019-01-10 NOTE — Telephone Encounter (Signed)
Patient left a msg on the refill vm requesting samples of eliquis. Patient can be reached at [phone]. Thanks, MI

## 2019-01-10 NOTE — Telephone Encounter (Signed)
Spoke with patient.  She is down to 1 tablet - PCP has not called in prescription request from 1 week ago.   Will fill 3 month supply until she can get in touch with PCP.  Pt with PE, currently on 5 mg bid.

## 2019-01-10 NOTE — Telephone Encounter (Signed)
Hey we have no onsite triage nurses- I am helping remote- if we could have you guys check to see if there are samples, that would be great. Thank you!

## 2019-01-10 NOTE — Telephone Encounter (Signed)
triage

## 2019-01-12 ENCOUNTER — Other Ambulatory Visit (INDEPENDENT_AMBULATORY_CARE_PROVIDER_SITE_OTHER): Payer: Medicare Other

## 2019-01-12 ENCOUNTER — Encounter: Payer: Self-pay | Admitting: Gastroenterology

## 2019-01-12 ENCOUNTER — Ambulatory Visit: Payer: Medicare Other | Admitting: Gastroenterology

## 2019-01-12 VITALS — BP 128/80 | HR 67 | Temp 97.2°F | Ht 65.0 in | Wt 168.0 lb

## 2019-01-12 DIAGNOSIS — K5909 Other constipation: Secondary | ICD-10-CM

## 2019-01-12 DIAGNOSIS — K219 Gastro-esophageal reflux disease without esophagitis: Secondary | ICD-10-CM | POA: Insufficient documentation

## 2019-01-12 DIAGNOSIS — R109 Unspecified abdominal pain: Secondary | ICD-10-CM | POA: Diagnosis not present

## 2019-01-12 DIAGNOSIS — R1030 Lower abdominal pain, unspecified: Secondary | ICD-10-CM | POA: Insufficient documentation

## 2019-01-12 DIAGNOSIS — K5900 Constipation, unspecified: Secondary | ICD-10-CM | POA: Diagnosis not present

## 2019-01-12 LAB — IGA: IgA: 306 mg/dL (ref 68–378)

## 2019-01-12 LAB — CORTISOL: Cortisol, Plasma: 10.7 ug/dL

## 2019-01-12 MED ORDER — PANTOPRAZOLE SODIUM 40 MG PO TBEC: 40.0000 mg | DELAYED_RELEASE_TABLET | Freq: Every day | ORAL | 3 refills | Status: DC

## 2019-01-12 NOTE — Progress Notes (Signed)
[place] VISIT   Primary Care Provider Panosh, Standley Brooking, MD [place] [address] [phone]  Patient Profile: Julie Boone is a 85 y.o. female with a pmh significant for prior PE (on anticoagulation, GERD, constipation, hypertension, breast cancer survivor, hard of hearing, arthritis, self-reported anxiety (not on therapy).  The patient presents to the [facility] for an evaluation and management of problem(s) noted below:  Problem List 1. Gastroesophageal reflux disease, unspecified whether esophagitis present   2. Lower abdominal pain   3. Other constipation     History of Present Illness Please see initial consultation note by PA Esterwood for full details of HPI.  Interval History The patient presents with daughter for follow-up today.  She had been prescribed Pepcid but as she thought she was taking pantoprazole.  However she does not take it regularly.  She has GERD symptoms for which they have tried to modify with regards to lifestyle modifications and dietary changes.  This is hard because her family is enriched by anti and [place] from her late husband and they love many things including tomatoes and onions and garlic and meats.  This is made her GERD symptoms more difficult to control and her daughter, her main primary caregiver, has found it difficult at times in regards to trying to keep things as regular as they can for her mom but also to be thoughtful about her medical issues.  She does take the hyoscyamine with some effect in regards to lower abdominal cramping.  She continues to have issues with constipation at times.  Normally has a daily bowel movement and if things start to slow down and not go for 1 or 2 days she starts picking up her prune juice and prune intake.  She is gone 3 days without a bowel movement as of today.  She does have laxatives at home but does not use them regularly since  her constipation comes and goes.  Is not clear that when she is constipated that she has more significant symptoms.  Last endoscopic evaluation had previously been reported in 2009 from below but she has not had an upper endoscopy previously.  Weight seems to be stable.  No blood in her stools.  GI Review of Systems Positive as above Negative for dysphagia, odynophagia, nausea, vomiting, melena  Review of Systems General: Denies fevers/chills/weight loss HEENT: Denies oral lesions Cardiovascular: Denies chest pain Pulmonary: Denies shortness of breath Gastroenterological: See HPI Genitourinary: Denies darkened urine Hematological: Denies easy bruising/bleeding Endocrine: Denies temperature intolerance Dermatological: Denies jaundice Psychological: Mood is stable   Medications Current Outpatient Medications  Medication Sig Dispense Refill   apixaban (ELIQUIS) 5 MG TABS tablet Take 1 tablet (5 mg total) by mouth 2 (two) times daily. 180 tablet 0   atenolol (TENORMIN) 50 MG tablet Take 50 mg by mouth daily.     Cholecalciferol (VITAMIN D-3) 125 MCG (5000 UT) TABS Take 1 tablet by mouth daily.     famotidine (PEPCID) 20 MG tablet Take 1 tablet (20 mg total) by mouth 2 (two) times daily. 60 tablet 2   hyoscyamine (LEVSIN SL) 0.125 MG SL tablet PLACE 1 TABLET UNDER THE TONGUE EVERY 6 (SIX) HOURS AS NEEDED. 120 tablet 0   lisinopril (ZESTRIL) 40 MG tablet TAKE 1 TABLET BY MOUTH EVERY DAY 90 tablet 1   meclizine (ANTIVERT) 25 MG tablet 1/2 to 1  po up to every 8 hours as needed for vertigo  Fall precautions (Patient  taking differently: as needed. 1/2 to 1  po up to every 8 hours as needed for vertigo  Fall precautions) 20 tablet 0   pantoprazole (PROTONIX) 40 MG tablet Take 1 tablet (40 mg total) by mouth daily before breakfast. 90 tablet 3   No current facility-administered medications for this visit.     Allergies Allergies  Allergen Reactions   Morphine And Related Nausea And  Vomiting   Pantoprazole Other (See Comments)    Nausea and dizzy    Histories Past Medical History:  Diagnosis Date   Acute sinusitis with symptoms greater than 10 days 06/23/2013   Arthritis    Breast cancer ([place])    mastectomy 3   neg ln    HOH (hard of hearing)    HTN (hypertension)    Wears glasses    Wears hearing aid    both ears   Past Surgical History:  Procedure Laterality Date   APPENDECTOMY  2006   BREAST BIOPSY Left 2012   COLONOSCOPY     HERNIA REPAIR     INSERTION OF MESH N/A 09/05/2013   Procedure: INSERTION OF MESH;  Surgeon: Joyice Faster. Cornett, MD;  Location: [place];  Service: General;  Laterality: N/A;   MASTECTOMY Left 2013   TONSILLECTOMY AND ADENOIDECTOMY     Childhood   UMBILICAL HERNIA REPAIR N/A 09/05/2013   Procedure: HERNIA REPAIR UMBILICAL ADULT WITH MESH;  Surgeon: Joyice Faster. Cornett, MD;  Location: [facility];  Service: General;  Laterality: N/A;   Social History   Socioeconomic History   Marital status: Widowed    Spouse name: Not on file   Number of children: 1   Years of education: Not on file   Highest education level: Not on file  Occupational History   Not on file  Social Needs   Financial resource strain: Not on file   Food insecurity    Worry: Not on file    Inability: Not on file   Transportation needs    Medical: Not on file    Non-medical: Not on file  Tobacco Use   Smoking status: Never Smoker   Smokeless tobacco: Never Used  Substance and Sexual Activity   Alcohol use: Yes    Alcohol/week: 0.0 standard drinks    Comment: 1 time a month. has a sip ever now and then    Drug use: No   Sexual activity: Not on file  Lifestyle   Physical activity    Days per week: Not on file    Minutes per session: Not on file   Stress: Not on file  Relationships   Social connections    Talks on phone: Not on file    Gets together: Not on file    Attends religious  service: Not on file    Active member of club or organization: Not on file    Attends meetings of clubs or organizations: Not on file    Relationship status: Not on file   Intimate partner violence    Fear of current or ex partner: Not on file    Emotionally abused: Not on file    Physically abused: Not on file    Forced sexual activity: Not on file  Other Topics Concern   Not on file  Social History Narrative   Usually receives 5 hours of sleep per night   2people living in the home   Puppy  smakk [employer] and [employer]   From [place] moved  from [place]  Adopted daughter nearby.      Neg ets  Wine with meals  No tob rd.   G0P0   Married husband [place] background  Herself [place] descent.   Husband passed CHF and prostate cancer 2017   Now living with daughter         Family History  Problem Relation Age of Onset   Hypertension Mother    Stroke Mother    Heart failure Mother    Lung disease Sister        smoker   Colon cancer Father        dx in his late 101's   Esophageal cancer Neg Hx    Inflammatory bowel disease Neg Hx    Liver disease Neg Hx    Pancreatic cancer Neg Hx    Stomach cancer Neg Hx    I have reviewed her medical, social, and family history in detail and updated the electronic medical record as necessary.    PHYSICAL EXAMINATION  BP 128/80 (BP Location: Left Arm, Patient Position: Sitting, Cuff Size: Normal)    Pulse 67    Temp (!) 97.2 F (36.2 C) (Other (Comment))    Ht 5\' 5"  (1.651 m)    Wt 168 lb (76.2 kg) Comment: per patient   BMI 27.96 kg/m  Wt Readings from Last 3 Encounters:  01/12/19 168 lb (76.2 kg)  11/08/18 169 lb (76.7 kg)  10/24/18 170 lb 3.2 oz (77.2 kg)  GEN: NAD, appears stated age, doesn't appear chronically ill, accompanied by daughter PSYCH: Cooperative, without pressured speech EYE: Conjunctivae pink, sclerae anicteric ENT: MMM CV: RR without R/Gs  RESP: CTAB posteriorly, without wheezing GI: NABS, soft, protuberant, NT,  without rebound or guarding, no HSM appreciated MSK/EXT: No significant lower extremity edema SKIN: No jaundice NEURO:  Alert & Oriented x 3, no focal deficits   REVIEW OF DATA  I reviewed the following data at the time of this encounter:  GI Procedures and Studies  Previously noted in initial consultation note with last colonoscopy greater than 10 years ago  Laboratory Studies  Reviewed those in [facility]  Imaging Studies  September 2020 CTAP with contrast IMPRESSION: 1. No acute findings or explanation for the patient's symptoms. 2. Previously noted hepatic steatosis has resolved. Stable hepatic and renal cysts. 3.  Aortic Atherosclerosis (ICD10-I70.0). 4. Lumbar spondylosis.   ASSESSMENT  Julie Boone is a 85 y.o. female with a pmh significant for prior PE (on anticoagulation, GERD, constipation, hypertension, breast cancer survivor, hard of hearing, arthritis, self-reported anxiety (not on therapy).  The patient is seen today for evaluation and management of:  1. Gastroesophageal reflux disease, unspecified whether esophagitis present   2. Lower abdominal pain   3. Other constipation    The patient is hemodynamically stable.  Although she continues to have issues of lower abdominal cramping and discomfort with a negative cross-sectional imaging study her biggest symptom an issue at current status is GERD.  Gone over lifestyle modifications but also discussed the role of truly taking PPI therapy 30 minutes before breakfast on a daily basis.  Depending how she does with this we will need to consider the role of possible upper endoscopic evaluation.  We also go ahead and stop Pepcid which is not clear she was even taking on a regular basis.  She will continue her hyoscyamine for now.  She will begin taking MiraLAX today and tomorrow to try and help move things along.  She will not get behind  with her prunes/prune juice again.  We will do some basic lab work to evaluate a few other things  as noted below.  We will see her back in approximately 4 weeks to see how she does.  If she continues to have issues then we will consider higher risk endoscopic evaluation from above and below.  I hope to not have to do that but if she continues to have issues we may find it helpful to look inside ensure there is nothing else is going on.  Not clear to me that this is bacterial overgrowth.  All patient questions were answered, to the best of my ability, and the patient agrees to the aforementioned plan of action with follow-up as indicated.   PLAN  Protonix 40 mg daily (30 minutes before breakfast) Laboratories as outlined below Continue prunes/prune juices as needed for constipation Initiate MiraLAX once to twice daily to have a bowel movement on a daily or every other day basis as needed Continue hyoscyamine If she continues to have issues with follow-up we will consider a diagnostic upper and lower endoscopy   Orders Placed This Encounter  Procedures   Cortisol   IgA   Tissue transglutaminase, IgA    New Prescriptions   PANTOPRAZOLE (PROTONIX) 40 MG TABLET    Take 1 tablet (40 mg total) by mouth daily before breakfast.   Modified Medications   No medications on file    Planned Follow Up No follow-ups on file.   Justice Britain, MD [facility] Advanced [facility]oscopy Office # [id number]

## 2019-01-12 NOTE — Patient Instructions (Addendum)
Stop Pepcid.   Start Pantoprazole 40mg  - once daily before breakfast.   We have sent the following medications to your pharmacy for you to pick up at your convenience: Pantoprazole  If you are age 83 or older, your body mass index should be between 23-30. Your Body mass index is 27.96 kg/m. If this is out of the aforementioned range listed, please consider follow up with your Primary Care Provider.  If you are age 26 or younger, your body mass index should be between 19-25. Your Body mass index is 27.96 kg/m. If this is out of the aformentioned range listed, please consider follow up with your Primary Care Provider.    Your provider has requested that you go to the basement level for lab work before leaving today. Press "B" on the elevator. The lab is located at the first door on the left as you exit the elevator.  Keep follow-up in Dec 2020 with Dr Rush Landmark   Thank you for choosing me and [facility].  Dr. Rush Landmark

## 2019-01-13 LAB — TISSUE TRANSGLUTAMINASE, IGA: (tTG) Ab, IgA: 1 U/mL

## 2019-01-24 ENCOUNTER — Telehealth: Payer: Self-pay

## 2019-01-24 NOTE — Telephone Encounter (Signed)
Copied from [place] [id number]. Topic: Appointment Scheduling - Scheduling Inquiry for Clinic >> Jan 24, 2019  1:23 PM Celene Kras wrote: Reason for CRM: Pts daughter called and is needing to schedule an AWV  for pt. She states she would like to have this done virtually. Please advise. >> Jan 24, 2019  2:16 PM Cox, Melburn Hake, CMA wrote: Pt has been scheduled via My Chart - message sent to [facility].

## 2019-01-27 ENCOUNTER — Other Ambulatory Visit: Payer: Self-pay | Admitting: Physician Assistant

## 2019-02-07 ENCOUNTER — Ambulatory Visit (INDEPENDENT_AMBULATORY_CARE_PROVIDER_SITE_OTHER): Payer: Medicare Other | Admitting: Otolaryngology

## 2019-02-07 ENCOUNTER — Telehealth: Payer: Self-pay | Admitting: Internal Medicine

## 2019-02-07 ENCOUNTER — Other Ambulatory Visit: Payer: Self-pay

## 2019-02-07 DIAGNOSIS — H6123 Impacted cerumen, bilateral: Secondary | ICD-10-CM

## 2019-02-07 NOTE — Telephone Encounter (Signed)
I do not see that we called her

## 2019-02-07 NOTE — Progress Notes (Signed)
HPI: Julie Boone is a 85 y.o. female who presents for evaluation of cerumen impaction for cleaning.  She has small ear canals bilaterally..  Past Medical History:  Diagnosis Date  . Acute sinusitis with symptoms greater than 10 days 06/23/2013  . Arthritis   . Breast cancer ([place])    mastectomy 3   neg ln   . HOH (hard of hearing)   . HTN (hypertension)   . Wears glasses   . Wears hearing aid    both ears   Past Surgical History:  Procedure Laterality Date  . APPENDECTOMY  2006  . BREAST BIOPSY Left 2012  . COLONOSCOPY    . HERNIA REPAIR    . INSERTION OF MESH N/A 09/05/2013   Procedure: INSERTION OF MESH;  Surgeon: Joyice Faster. Cornett, MD;  Location: [facility];  Service: General;  Laterality: N/A;  . MASTECTOMY Left 2013  . TONSILLECTOMY AND ADENOIDECTOMY     Childhood  . UMBILICAL HERNIA REPAIR N/A 09/05/2013   Procedure: HERNIA REPAIR UMBILICAL ADULT WITH MESH;  Surgeon: Joyice Faster. Cornett, MD;  Location: [facility];  Service: General;  Laterality: N/A;   Social History   Socioeconomic History  . Marital status: Widowed    Spouse name: Not on file  . Number of children: 1  . Years of education: Not on file  . Highest education level: Not on file  Occupational History  . Not on file  Social Needs  . Financial resource strain: Not on file  . Food insecurity    Worry: Not on file    Inability: Not on file  . Transportation needs    Medical: Not on file    Non-medical: Not on file  Tobacco Use  . Smoking status: Never Smoker  . Smokeless tobacco: Never Used  Substance and Sexual Activity  . Alcohol use: Yes    Alcohol/week: 0.0 standard drinks    Comment: 1 time a month. has a sip ever now and then   . Drug use: No  . Sexual activity: Not on file  Lifestyle  . Physical activity    Days per week: Not on file    Minutes per session: Not on file  . Stress: Not on file  Relationships  . Social Herbalist on phone: Not  on file    Gets together: Not on file    Attends religious service: Not on file    Active member of club or organization: Not on file    Attends meetings of clubs or organizations: Not on file    Relationship status: Not on file  Other Topics Concern  . Not on file  Social History Narrative   Usually receives 5 hours of sleep per night   2people living in the home   Puppy  smakk [place] and [place]   From [place] moved from [place]  Adopted daughter nearby.      Neg ets  Wine with meals  No tob rd.   G0P0   Married husband [place] background  Herself [place] descent.   Husband passed CHF and prostate cancer 2017   Now living with daughter         Family History  Problem Relation Age of Onset  . Hypertension Mother   . Stroke Mother   . Heart failure Mother   . Lung disease Sister        smoker  . Colon cancer Father  dx in his late 33's  . Esophageal cancer Neg Hx   . Inflammatory bowel disease Neg Hx   . Liver disease Neg Hx   . Pancreatic cancer Neg Hx   . Stomach cancer Neg Hx    Allergies  Allergen Reactions  . Morphine And Related Nausea And Vomiting  . Pantoprazole Other (See Comments)    Nausea and dizzy   Prior to Admission medications   Medication Sig Start Date End Date Taking? Authorizing Provider  apixaban (ELIQUIS) 5 MG TABS tablet Take 1 tablet (5 mg total) by mouth 2 (two) times daily. 01/10/19   Elouise Munroe, MD  atenolol (TENORMIN) 50 MG tablet Take 50 mg by mouth daily.    [provider]  Cholecalciferol (VITAMIN D-3) 125 MCG (5000 UT) TABS Take 1 tablet by mouth daily.    [provider]  famotidine (PEPCID) 20 MG tablet Take 1 tablet (20 mg total) by mouth 2 (two) times daily. 11/15/18   Esterwood, Amy S, PA-C  hyoscyamine (LEVSIN SL) 0.125 MG SL tablet PLACE 1 TABLET UNDER THE TONGUE EVERY 6 (SIX) HOURS AS NEEDED. 01/30/19   Esterwood, Amy S, PA-C  lisinopril (ZESTRIL) 40 MG tablet TAKE 1 TABLET BY MOUTH EVERY DAY 11/11/18    Panosh, Standley Brooking, MD  meclizine (ANTIVERT) 25 MG tablet 1/2 to 1  po up to every 8 hours as needed for vertigo  Fall precautions Patient taking differently: as needed. 1/2 to 1  po up to every 8 hours as needed for vertigo  Fall precautions 04/05/18   Panosh, Standley Brooking, MD  pantoprazole (PROTONIX) 40 MG tablet Take 1 tablet (40 mg total) by mouth daily before breakfast. 01/12/19   Mansouraty, Telford Nab., MD     Positive ROS: Otherwise negative  All other systems have been reviewed and were otherwise negative with the exception of those mentioned in the HPI and as above.  Physical Exam: Constitutional: Alert, well-appearing, no acute distress Ears: External ears without lesions or tenderness. Ear canals she has small ear canals that were cleaned in the office today.. Nasal: External nose without lesions. Clear nasal passages Oral: Oropharynx clear. Neck: No palpable adenopathy or masses Respiratory: Breathing comfortably  Skin: No facial/neck lesions or rash noted.  Cerumen impaction removal  Date/Time: 02/07/2019 8:02 PM Performed by: [name], MD Authorized by: [name], MD   Consent:    Consent obtained:  Verbal   Consent given by:  Patient   Risks discussed:  Pain and bleeding Procedure details:    Location:  L ear and R ear   Procedure type: curette and forceps   Post-procedure details:    Inspection:  TM intact and canal normal   Hearing quality:  Improved   Patient tolerance of procedure:  Tolerated well, no immediate complications    Assessment: Cerumen impactions  Plan: Cleaned in the office today she will follow-up as needed  Radene Journey, MD

## 2019-02-07 NOTE — Telephone Encounter (Signed)
Spoke with daughter.  Message left was not in detailed. RN thinks scheduler were calling to schedule appointment.  appointment schedule for virtual in Mar 09, 2019 at 9:40 am.

## 2019-02-07 NOTE — Telephone Encounter (Signed)
° ° °  Patients daughter is returning call to nurse. States she received a message, unsure why, no details, no note available

## 2019-02-07 NOTE — Telephone Encounter (Signed)
Spoke with daughter she states that someone left a message from Dr Delphina Cahill office

## 2019-02-16 ENCOUNTER — Other Ambulatory Visit: Payer: Self-pay | Admitting: Physician Assistant

## 2019-02-27 ENCOUNTER — Other Ambulatory Visit: Payer: Self-pay

## 2019-02-27 ENCOUNTER — Ambulatory Visit (INDEPENDENT_AMBULATORY_CARE_PROVIDER_SITE_OTHER): Payer: Medicare Other

## 2019-02-27 VITALS — BP 154/76 | Ht 65.0 in | Wt 172.0 lb

## 2019-02-27 DIAGNOSIS — Z0000 Encounter for general adult medical examination without abnormal findings: Secondary | ICD-10-CM | POA: Diagnosis not present

## 2019-02-27 NOTE — Progress Notes (Signed)
This visit is being conducted via phone call due to the COVID-19 pandemic. This patient has given me verbal consent via phone to conduct this visit, patient states they are participating from their home address. Some vital signs may be absent or patient reported.   Patient identification: identified by name, DOB, and current address.  Location provider: [facility], Office Persons participating in the virtual visit: Ms. [place] Bruckner and Franne Forts, LPN.    Subjective:   Julie Boone is a 85 y.o. female who presents for Medicare Annual (Subsequent) preventive examination.  Ms. Meikle is very pleasant and has no complaints at this time. She is able to safely walk on her patio and in the yard occasionally. She lives with her daughter.   Review of Systems:   Cardiac Risk Factors include: hypertension;advanced age (>49men, >56 women);sedentary lifestyle    Objective:    Vitals: BP (!) 154/76 (Patient Position: Sitting)   Ht 5\' 5"  (1.651 m)   Wt 172 lb (78 kg)   BMI 28.62 kg/m   Body mass index is 28.62 kg/m.  Advanced Directives 02/27/2019 11/23/2017 03/24/2017 10/08/2016 11/18/2015 10/10/2014 02/18/2014  Does Patient Have a Medical Advance Directive? Yes Yes No;Yes No No No Yes  Type of Paramedic of [place];Living will - [place];Living will - - - -  Does patient want to make changes to medical advance directive? No - Patient declined - No - Patient declined - - - -  Copy of [place] in Chart? No - copy requested - No - copy requested - - - No - copy requested  Would patient like information on creating a medical advance directive? - - No - Patient declined - No - patient declined information - -    Tobacco Social History   Tobacco Use  Smoking Status Never Smoker  Smokeless Tobacco Never Used     Counseling given: Not Answered   Clinical Intake:  Pre-visit preparation completed: Yes  Pain :  No/denies pain     BMI - recorded: 28.62 Nutritional Status: BMI 25 -29 Overweight Nutritional Risks: Unintentional weight loss Diabetes: No  How often do you need to have someone help you when you read instructions, pamphlets, or other written materials from your doctor or pharmacy?: 1 - Never What is the last grade level you completed in school?: high school graduate  Interpreter Needed?: No  Information entered by :: Franne Forts, LPN.  Past Medical History:  Diagnosis Date  . Acute sinusitis with symptoms greater than 10 days 06/23/2013  . Arthritis   . Breast cancer ([place])    mastectomy 3   neg ln   . HOH (hard of hearing)   . HTN (hypertension)   . Wears glasses   . Wears hearing aid    both ears   Past Surgical History:  Procedure Laterality Date  . APPENDECTOMY  2006  . BREAST BIOPSY Left 2012  . COLONOSCOPY    . HERNIA REPAIR    . INSERTION OF MESH N/A 09/05/2013   Procedure: INSERTION OF MESH;  Surgeon: Joyice Faster. Cornett, MD;  Location: [facility];  Service: General;  Laterality: N/A;  . MASTECTOMY Left 2013  . TONSILLECTOMY AND ADENOIDECTOMY     Childhood  . UMBILICAL HERNIA REPAIR N/A 09/05/2013   Procedure: HERNIA REPAIR UMBILICAL ADULT WITH MESH;  Surgeon: Joyice Faster. Cornett, MD;  Location: [facility];  Service: General;  Laterality: N/A;   Family  History  Problem Relation Age of Onset  . Hypertension Mother   . Stroke Mother   . Heart failure Mother   . Lung disease Sister        smoker  . Colon cancer Father        dx in his late 66's  . Esophageal cancer Neg Hx   . Inflammatory bowel disease Neg Hx   . Liver disease Neg Hx   . Pancreatic cancer Neg Hx   . Stomach cancer Neg Hx    Social History   Socioeconomic History  . Marital status: Widowed    Spouse name: Not on file  . Number of children: 1  . Years of education: 31  . Highest education level: High school graduate  Occupational History    Comment:  retired  Tobacco Use  . Smoking status: Never Smoker  . Smokeless tobacco: Never Used  Substance and Sexual Activity  . Alcohol use: Yes    Alcohol/week: 0.0 standard drinks    Comment: 1 time a month. has a sip ever now and then   . Drug use: No  . Sexual activity: Not on file  Other Topics Concern  . Not on file  Social History Narrative   2 people living in the home   Dog Small yorkie and [place]   From [place] moved from [place]  Adopted daughter nearby.      Neg ets  Wine with meals  No tob rd.   G0P0   Married husband [place] background  Herself [place] descent.   Husband passed CHF and prostate cancer 2017   Now living with daughter         Social Determinants of Health   Financial Resource Strain: Low Risk   . Difficulty of Paying Living Expenses: Not hard at all  Food Insecurity: No Food Insecurity  . Worried About Charity fundraiser in the Last Year: Never true  . Ran Out of Food in the Last Year: Never true  Transportation Needs: No Transportation Needs  . Lack of Transportation (Medical): No  . Lack of Transportation (Non-Medical): No  Physical Activity: Inactive  . Days of Exercise per Week: 0 days  . Minutes of Exercise per Session: 0 min  Stress:   . Feeling of Stress : Not on file  Social Connections: Unknown  . Frequency of Communication with Friends and Family: More than three times a week  . Frequency of Social Gatherings with Friends and Family: Not on file  . Attends Religious Services: Not on file  . Active Member of Clubs or Organizations: Not on file  . Attends Archivist Meetings: Not on file  . Marital Status: Widowed    Outpatient Encounter Medications as of 02/27/2019  Medication Sig  . apixaban (ELIQUIS) 5 MG TABS tablet Take 1 tablet (5 mg total) by mouth 2 (two) times daily.  [name] atenolol (TENORMIN) 50 MG tablet Take 50 mg by mouth daily.  . Cholecalciferol (VITAMIN D-3) 125 MCG (5000 UT) TABS Take 1 tablet by mouth daily.  [name]  lisinopril (ZESTRIL) 40 MG tablet TAKE 1 TABLET BY MOUTH EVERY DAY  . meclizine (ANTIVERT) 25 MG tablet 1/2 to 1  po up to every 8 hours as needed for vertigo  Fall precautions (Patient taking differently: as needed. 1/2 to 1  po up to every 8 hours as needed for vertigo  Fall precautions)  . pantoprazole (PROTONIX) 40 MG tablet Take 1 tablet (40 mg total) by  mouth daily before breakfast.  . famotidine (PEPCID) 20 MG tablet TAKE 1 TABLET BY MOUTH TWICE A DAY  . hyoscyamine (LEVSIN SL) 0.125 MG SL tablet PLACE 1 TABLET UNDER THE TONGUE EVERY 6 (SIX) HOURS AS NEEDED.   No facility-administered encounter medications on file as of 02/27/2019.    Activities of Daily Living In your present state of health, do you have any difficulty performing the following activities: 02/27/2019  Hearing? Y  Vision? N  Difficulty concentrating or making decisions? N  Walking or climbing stairs? Y  Dressing or bathing? N  Doing errands, shopping? Y  Preparing Food and eating ? N  Using the Toilet? N  In the past six months, have you accidently leaked urine? N  Do you have problems with loss of bowel control? N  Managing your Medications? N  Managing your Finances? N  Housekeeping or managing your Housekeeping? N  Some recent data might be hidden    Patient Care Team: Panosh, Standley Brooking, MD as PCP - General (Internal Medicine) Elouise Munroe, MD as PCP - Cardiology (Cardiology) Erroll Luna, MD as Consulting Physician (General Surgery) Inocencio Homes, DPM as Consulting Physician (Podiatry)    Assessment:   This is a routine wellness examination for [place].  Exercise Activities and Dietary recommendations Current Exercise Habits: The patient does not participate in regular exercise at present(patient does try to walk around home but is not steady enough to do more.), Exercise limited by: None identified  Goals    . 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 0 No No No No   Is the patient's home free of loose throw rugs in walkways, pet beds, electrical cords, etc?   yes      Grab bars in the bathroom? yes      Handrails on the stairs?   yes      Adequate lighting?   yes  Timed Get Up and Go performed: N/A due to telephone visit.  Depression Screen PHQ 2/9 Scores 02/27/2019 11/23/2017 12/02/2016 10/31/2014  PHQ - 2 Score 0 1 0 0     Cognitive Function     6CIT Screen 02/27/2019  What Year? 0 points  What month? 0 points  What time? 0 points  Count back from 20 0 points  Months in reverse 0 points  Repeat phrase 0 points  Total Score 0     There is no immunization history on file for this patient.  Qualifies for Shingles Vaccine?yes, patient's daughter to inquire about out of pocket costs from pharmacy.  Screening Tests Health Maintenance  Topic Date Due  . TETANUS/TDAP  03/25/1943  . MAMMOGRAM  02/15/2018  . INFLUENZA VACCINE  10/01/2018  . DEXA SCAN  Completed    Cancer Screenings: Lung: Low Dose CT Chest recommended if Age 90-80 years, 30 pack-year currently smoking OR have quit w/in 15years. Patient does not qualify. Breast:  Up to date on Mammogram? Yes   Up to date of Bone Density/Dexa? Yes Colorectal: Yes   Due to advanced age, patient is no longer having preventive health screenings.  Additional Screenings:  Hepatitis C Screening: N/A due to advanced age.     Plan:   Ms. Zody feels that she has had a pneumonia vaccine  since she turned 89 and declines additional. She also declines flu and Tdap vaccines at this time. She is considering shingrix vaccines and will have daughter to inquire about her out of pocket cost from her pharmacy.    I have personally reviewed and noted the following in the patient's chart:   . Medical and social history . Use of alcohol,  tobacco or illicit drugs  . Current medications and supplements . Functional ability and status . Nutritional status . Physical activity . Advanced directives . List of other physicians . Hospitalizations, surgeries, and ER visits in previous 12 months . Vitals . Screenings to include cognitive, depression, and falls . Referrals and appointments  In addition, I have reviewed and discussed with patient certain preventive protocols, quality metrics, and best practice recommendations. A written personalized care plan for preventive services as well as general preventive health recommendations were provided to patient.     Franne Forts, LPN  [phone]

## 2019-02-27 NOTE — Patient Instructions (Signed)
Julie Boone , Thank you for taking time to participate in your Medicare Wellness Visit. I appreciate your ongoing commitment to your health goals. Please review the following plan we discussed and let me know if I can assist you in the future.   Screening recommendations/referrals: Colorectal Screening: colonoscopy 08/15/2007; no longer necessary given age. Mammogram: last one was 02/15/2017; no longer necessary given age. Bone Density: last one 02/10/2010; no longer necessary given age.  Vision and Dental Exams: Recommended annual ophthalmology exams for early detection of glaucoma and other disorders of the eye Recommended annual dental exams for proper oral hygiene  Diabetic Exams: Diabetic Eye Exam: N/A  Diabetic Foot Exam: N/A  Vaccinations: Influenza vaccine: declines Pneumococcal vaccine: patient thinks she had one since turning 56. Declines any additional. Tdap vaccine: None documented. Patient can receive at our office or pharmacy.  Shingles vaccine: Please have your daughter contact your pharmacy to determine your out of pocket expense for the Shingrix vaccine. You may receive this vaccine at your local pharmacy. This is a series of two injections to be given 2-6 months apart.   Advanced directives: Advance directives discussed with you today. Please bring a copy of your POA (Power of [place]) and/or Living Will to your next appointment.  Goals: Recommend to drink at least 6-8 8oz glasses of water per day. Recommend to remove any items from the home that may cause slips or trips.   Next appointment: Please schedule your Annual Wellness Visit with your Nurse Health Advisor in one year.  Preventive Care 83 Years and Older, Female Preventive care refers to lifestyle choices and visits with your health care provider that can promote health and wellness. What does preventive care include?  A yearly physical exam. This is also called an annual well check.  Dental exams once  or twice a year.  Routine eye exams. Ask your health care provider how often you should have your eyes checked.  Personal lifestyle choices, including:  Daily care of your teeth and gums.  Regular physical activity.  Eating a healthy diet.  Avoiding tobacco and drug use.  Limiting alcohol use.  Practicing safe sex.  Taking low-dose aspirin every day if recommended by your health care provider.  Taking vitamin and mineral supplements as recommended by your health care provider. What happens during an annual well check? The services and screenings done by your health care provider during your annual well check will depend on your age, overall health, lifestyle risk factors, and family history of disease. Counseling  Your health care provider may ask you questions about your:  Alcohol use.  Tobacco use.  Drug use.  Emotional well-being.  Home and relationship well-being.  Sexual activity.  Eating habits.  History of falls.  Memory and ability to understand (cognition).  Work and work Statistician.  Reproductive health. Screening  You may have the following tests or measurements:  Height, weight, and BMI.  Blood pressure.  Lipid and cholesterol levels. These may be checked every 5 years, or more frequently if you are over 24 years old.  Skin check.  Lung cancer screening. You may have this screening every year starting at age 83 if you have a 30-pack-year history of smoking and currently smoke or have quit within the past 15 years.  Fecal occult blood test (FOBT) of the stool. You may have this test every year starting at age 83.  Flexible sigmoidoscopy or colonoscopy. You may have a sigmoidoscopy every 5 years or a colonoscopy every  10 years starting at age 83.  Hepatitis C blood test.  Hepatitis B blood test.  Sexually transmitted disease (STD) testing.  Diabetes screening. This is done by checking your blood sugar (glucose) after you have not eaten  for a while (fasting). You may have this done every 1-3 years.  Bone density scan. This is done to screen for osteoporosis. You may have this done starting at age 83.  Mammogram. This may be done every 1-2 years. Talk to your health care provider about how often you should have regular mammograms. Talk with your health care provider about your test results, treatment options, and if necessary, the need for more tests. Vaccines  Your health care provider may recommend certain vaccines, such as:  Influenza vaccine. This is recommended every year.  Tetanus, diphtheria, and acellular pertussis (Tdap, Td) vaccine. You may need a Td booster every 10 years.  Zoster vaccine. You may need this after age 40.  Pneumococcal 13-valent conjugate (PCV13) vaccine. One dose is recommended after age 83.  Pneumococcal polysaccharide (PPSV23) vaccine. One dose is recommended after age 18. Talk to your health care provider about which screenings and vaccines you need and how often you need them. This information is not intended to replace advice given to you by your health care provider. Make sure you discuss any questions you have with your health care provider. Document Released: 03/15/2015 Document Revised: 11/06/2015 Document Reviewed: 12/18/2014 Elsevier Interactive Patient Education  2017 [place] Prevention in the Home Falls can cause injuries. They can happen to people of all ages. There are many things you can do to make your home safe and to help prevent falls. What can I do on the outside of my home?  Regularly fix the edges of walkways and driveways and fix any cracks.  Remove anything that might make you trip as you walk through a door, such as a raised step or threshold.  Trim any bushes or trees on the path to your home.  Use bright outdoor lighting.  Clear any walking paths of anything that might make someone trip, such as rocks or tools.  Regularly check to see if  handrails are loose or broken. Make sure that both sides of any steps have handrails.  Any raised decks and porches should have guardrails on the edges.  Have any leaves, snow, or ice cleared regularly.  Use sand or salt on walking paths during winter.  Clean up any spills in your garage right away. This includes oil or grease spills. What can I do in the bathroom?  Use night lights.  Install grab bars by the toilet and in the tub and shower. Do not use towel bars as grab bars.  Use non-skid mats or decals in the tub or shower.  If you need to sit down in the shower, use a plastic, non-slip stool.  Keep the floor dry. Clean up any water that spills on the floor as soon as it happens.  Remove soap buildup in the tub or shower regularly.  Attach bath mats securely with double-sided non-slip rug tape.  Do not have throw rugs and other things on the floor that can make you trip. What can I do in the bedroom?  Use night lights.  Make sure that you have a light by your bed that is easy to reach.  Do not use any sheets or blankets that are too big for your bed. They should not hang down onto the floor.  Have a firm chair that has side arms. You can use this for support while you get dressed.  Do not have throw rugs and other things on the floor that can make you trip. What can I do in the kitchen?  Clean up any spills right away.  Avoid walking on wet floors.  Keep items that you use a lot in easy-to-reach places.  If you need to reach something above you, use a strong step stool that has a grab bar.  Keep electrical cords out of the way.  Do not use floor polish or wax that makes floors slippery. If you must use wax, use non-skid floor wax.  Do not have throw rugs and other things on the floor that can make you trip. What can I do with my stairs?  Do not leave any items on the stairs.  Make sure that there are handrails on both sides of the stairs and use them. Fix  handrails that are broken or loose. Make sure that handrails are as long as the stairways.  Check any carpeting to make sure that it is firmly attached to the stairs. Fix any carpet that is loose or worn.  Avoid having throw rugs at the top or bottom of the stairs. If you do have throw rugs, attach them to the floor with carpet tape.  Make sure that you have a light switch at the top of the stairs and the bottom of the stairs. If you do not have them, ask someone to add them for you. What else can I do to help prevent falls?  Wear shoes that:  Do not have high heels.  Have rubber bottoms.  Are comfortable and fit you well.  Are closed at the toe. Do not wear sandals.  If you use a stepladder:  Make sure that it is fully opened. Do not climb a closed stepladder.  Make sure that both sides of the stepladder are locked into place.  Ask someone to hold it for you, if possible.  Clearly mark and make sure that you can see:  Any grab bars or handrails.  First and last steps.  Where the edge of each step is.  Use tools that help you move around (mobility aids) if they are needed. These include:  Canes.  Walkers.  Scooters.  Crutches.  Turn on the lights when you go into a dark area. Replace any light bulbs as soon as they burn out.  Set up your furniture so you have a clear path. Avoid moving your furniture around.  If any of your floors are uneven, fix them.  If there are any pets around you, be aware of where they are.  Review your medicines with your doctor. Some medicines can make you feel dizzy. This can increase your chance of falling. Ask your doctor what other things that you can do to help prevent falls. This information is not intended to replace advice given to you by your health care provider. Make sure you discuss any questions you have with your health care provider. Document Released: 12/13/2008 Document Revised: 07/25/2015 Document Reviewed:  03/23/2014 Elsevier Interactive Patient Education  2017 [employer].

## 2019-03-02 ENCOUNTER — Ambulatory Visit: Payer: Medicare Other | Admitting: Gastroenterology

## 2019-03-09 ENCOUNTER — Telehealth (INDEPENDENT_AMBULATORY_CARE_PROVIDER_SITE_OTHER): Payer: Medicare Other | Admitting: Internal Medicine

## 2019-03-09 ENCOUNTER — Telehealth: Payer: Self-pay

## 2019-03-09 VITALS — BP 166/80 | HR 64

## 2019-03-09 DIAGNOSIS — R600 Localized edema: Secondary | ICD-10-CM | POA: Diagnosis not present

## 2019-03-09 DIAGNOSIS — I10 Essential (primary) hypertension: Secondary | ICD-10-CM | POA: Diagnosis not present

## 2019-03-09 DIAGNOSIS — I2699 Other pulmonary embolism without acute cor pulmonale: Secondary | ICD-10-CM | POA: Diagnosis not present

## 2019-03-09 MED ORDER — APIXABAN 5 MG PO TABS: 5.0000 mg | ORAL_TABLET | Freq: Two times a day (BID) | ORAL | 3 refills | Status: DC

## 2019-03-09 NOTE — Telephone Encounter (Signed)
Patient and/or DPR-approved person aware of AVS instructions and verbalized understanding.  Spoke with pt daughter. Advised 1/7 AVS will be available on mychart for review

## 2019-03-09 NOTE — Progress Notes (Signed)
Virtual Visit via Video Note   This visit type was conducted due to national recommendations for restrictions regarding the COVID-19 Pandemic (e.g. social distancing) in an effort to limit this patient's exposure and mitigate transmission in our community.  Due to her co-morbid illnesses, this patient is at least at moderate risk for complications without adequate follow up.  This format is felt to be most appropriate for this patient at this time.  All issues noted in this document were discussed and addressed.  A limited physical exam was performed with this format.  Please refer to the patient's chart for her consent to telehealth for [facility].   Date:  03/09/2019   ID:  [name], DOB [id number], MRN [id number]  Patient Location: Home Provider Location: Home  PCP:  Burnis Medin, MD  Cardiologist:  Elouise Munroe, MD  Electrophysiologist:  None   Evaluation Performed:  Follow-Up Visit  Chief Complaint:  F/u PE  History of Present Illness:    DYEMOND WORMAN is a 86 y.o. female with left breast cancer with mastectomy and negative follow-up thus far, recent diagnosis of pulmonary embolism, hypertension.   She is doing well and has no recurrent PE symptoms. Her AC has been uncomplicated, and she is having no concerning bleeding symptoms. Based on history it seems her PE was unprovoked, likely necessitating lifelong anticoagulation. She has faithfully worn compression stocking and feels that these help her lower extremity swelling. She has no concerns today and overall feels quite well. Her daughter was present for the telehealth visit today.   The patient denies chest pain, chest pressure, dyspnea at rest or with exertion, palpitations, PND, orthopnea, or worsening leg swelling. Denies syncope or presyncope. Denies dizziness or lightheadedness.    The patient does not have symptoms concerning for COVID-19 infection (fever, chills, cough, or new shortness of  breath).    Past Medical History:  Diagnosis Date  . Acute sinusitis with symptoms greater than 10 days 06/23/2013  . Arthritis   . Breast cancer ([place])    mastectomy 3   neg ln   . HOH (hard of hearing)   . HTN (hypertension)   . Wears glasses   . Wears hearing aid    both ears   Past Surgical History:  Procedure Laterality Date  . APPENDECTOMY  2006  . BREAST BIOPSY Left 2012  . COLONOSCOPY    . HERNIA REPAIR    . INSERTION OF MESH N/A 09/05/2013   Procedure: INSERTION OF MESH;  Surgeon: Joyice Faster. Cornett, MD;  Location: [facility];  Service: General;  Laterality: N/A;  . MASTECTOMY Left 2013  . TONSILLECTOMY AND ADENOIDECTOMY     Childhood  . UMBILICAL HERNIA REPAIR N/A 09/05/2013   Procedure: HERNIA REPAIR UMBILICAL ADULT WITH MESH;  Surgeon: Joyice Faster. Cornett, MD;  Location: [facility];  Service: General;  Laterality: N/A;     Current Meds  Medication Sig  . apixaban (ELIQUIS) 5 MG TABS tablet Take 1 tablet (5 mg total) by mouth 2 (two) times daily.  . Cholecalciferol (VITAMIN D-3) 125 MCG (5000 UT) TABS Take 1 tablet by mouth every other day.   . lisinopril (ZESTRIL) 40 MG tablet TAKE 1 TABLET BY MOUTH EVERY DAY  . meclizine (ANTIVERT) 25 MG tablet 1/2 to 1  po up to every 8 hours as needed for vertigo  Fall precautions (Patient taking differently: as needed. 1/2 to 1  po up to every 8 hours  as needed for vertigo  Fall precautions)  . pantoprazole (PROTONIX) 40 MG tablet Take 1 tablet (40 mg total) by mouth daily before breakfast.  . [DISCONTINUED] apixaban (ELIQUIS) 5 MG TABS tablet Take 1 tablet (5 mg total) by mouth 2 (two) times daily.  . [DISCONTINUED] atenolol (TENORMIN) 50 MG tablet Take 50 mg by mouth daily.     Allergies:   Morphine and related   Social History   Tobacco Use  . Smoking status: Never Smoker  . Smokeless tobacco: Never Used  Substance Use Topics  . Alcohol use: Yes    Alcohol/week: 0.0 standard drinks    Comment:  1 time a month. has a sip ever now and then   . Drug use: No     Family Hx: The patient's family history includes Colon cancer in her father; Heart failure in her mother; Hypertension in her mother; Lung disease in her sister; Stroke in her mother. There is no history of Esophageal cancer, Inflammatory bowel disease, Liver disease, Pancreatic cancer, or Stomach cancer.  ROS:   Please see the history of present illness.     All other systems reviewed and are negative.   Prior CV studies:   The following studies were reviewed today:    Labs/Other Tests and Data Reviewed:    EKG:  No ECG reviewed.  Recent Labs: 10/24/2018: ALT 22; BUN 25; Creatinine, Ser 0.94; Hemoglobin 14.1; Platelets 243.0; Potassium 4.1; Sodium 140; TSH 1.52   Recent Lipid Panel Lab Results  Component Value Date/Time   CHOL 204 (H) 10/31/2014 11:05 AM   TRIG 269.0 (H) 10/31/2014 11:05 AM   HDL 32.60 (L) 10/31/2014 11:05 AM   CHOLHDL 6 10/31/2014 11:05 AM   LDLDIRECT 110.0 10/31/2014 11:05 AM    Wt Readings from Last 3 Encounters:  02/27/19 172 lb (78 kg)  01/12/19 168 lb (76.2 kg)  11/08/18 169 lb (76.7 kg)     Objective:    Vital Signs:  BP (!) 166/80   Pulse 64    VITAL SIGNS:  reviewed GEN:  no acute distress EYES:  sclerae anicteric, EOMI - Extraocular Movements Intact RESPIRATORY:  normal respiratory effort, symmetric expansion CARDIOVASCULAR:  no peripheral edema SKIN:  no rash, lesions or ulcers. MUSCULOSKELETAL:  no obvious deformities. NEURO:  alert and oriented x 3, no obvious focal deficit PSYCH:  normal affect  ASSESSMENT & PLAN:    1. Acute pulmonary embolism without acute cor pulmonale, unspecified pulmonary embolism type ([place])   2. Essential hypertension   3. Leg edema    Unprovoked PE - continue indefinite AC. No bleeding complications.  HTN - continue therapy with atenolol and lisinopril, patient thinks her BP might have been elevated in anticipation of her appointment.  She hopes to make no changes to medications as she is feeling well. Participated in shared decision making.   LE swelling- discussed compression stockings, stable overall.  COVID-19 Education: The signs and symptoms of COVID-19 were discussed with the patient and how to seek care for testing (follow up with PCP or arrange E-visit).  The importance of social distancing was discussed today.  Time:   Today, I have spent 30 minutes on the encounter reviewing past records, and 20 minutes with the patient with telehealth technology discussing the above problems.     Medication Adjustments/Labs and Tests Ordered: Current medicines are reviewed at length with the patient today.  Concerns regarding medicines are outlined above.   Tests Ordered: No orders of the defined types were  placed in this encounter.   Medication Changes: Meds ordered this encounter  Medications  . apixaban (ELIQUIS) 5 MG TABS tablet    Sig: Take 1 tablet (5 mg total) by mouth 2 (two) times daily.    Dispense:  180 tablet    Refill:  3   F/u 6 mo  Signed, Elouise Munroe, MD  04/01/2019 9:01 PM    [facility]

## 2019-03-09 NOTE — Patient Instructions (Signed)
Medication Instructions:  Your physician recommends that you continue on your current medications as directed. Please refer to the Current Medication list given to you today.  YOUR PRESCRIPTION REFILL FOR ELIQUIS HAS BEEN SENT TO YOUR PHARMACY  *If you need a refill on your cardiac medications before your next appointment, please call your pharmacy*  Lab Work: none If you have labs (blood work) drawn today and your tests are completely normal, you will receive your results only by: [name] MyChart Message (if you have MyChart) OR . A paper copy in the mail If you have any lab test that is abnormal or we need to change your treatment, we will call you to review the results.  Testing/Procedures: none  Follow-Up: At [facility], you and your health needs are our priority.  As part of our continuing mission to provide you with exceptional heart care, we have created designated Provider Care Teams.  These Care Teams include your primary Cardiologist (physician) and Advanced Practice Providers (APPs -  Physician Assistants and Nurse Practitioners) who all work together to provide you with the care you need, when you need it.  Your next appointment:   6 month(s)  The format for your next appointment:   In Person  Provider:   Cherlynn Kaiser, MD  Other Instructions  How to Use Compression Stockings Compression stockings are elastic socks that squeeze the legs. They help increase blood flow (circulation) to the legs, decrease swelling in the legs, and reduce the chance of developing blood clots in the lower legs. Compression stockings are often used by people who:  Are recovering from surgery.  Have poor circulation in their legs.  Tend to get blood clots in their legs.  Have bulging (varicose) veins.  Sit or stay in bed for long periods of time. Follow instructions from your health care provider about how and when to wear your compression stockings. How to wear compression  stockings Before you put on your compression stockings:  Make sure that they are the correct size and degree of compression. If you do not know your size or required grade of compression, ask your health care provider and follow the manufacturer's instructions that come with the stockings.  Make sure that they are clean, dry, and in good condition.  Check them for rips and tears. Do not put them on if they are ripped or torn. Put your stockings on first thing in the morning, before you get out of bed. Keep them on for as long as your health care provider advises. When you are wearing your stockings:  Keep them as smooth as possible. Do not allow them to bunch up. It is especially important to prevent the stockings from bunching up around your toes or behind your knees.  Do not roll the stockings downward and leave them rolled down. This can decrease blood flow to your leg.  Change them right away if they become wet or dirty. When you take off your stockings, inspect your legs and feet. Check for:  Open sores.  Red spots.  Swelling. General tips  Do not stop wearing compression stockings without talking to your health care provider first.  Wash your stockings every day with mild detergent in cold or warm water. Do not use bleach. Air-dry your stockings or dry them in a clothes dryer on low heat. It may be helpful to have two pairs so that you have a pair to wear while the other is being washed.  Replace your stockings every  3-6 months.  If skin moisturizing is part of your treatment plan, apply lotion or cream at night so that your skin will be dry when you put on the stockings in the morning. It is harder to put the stockings on when you have lotion on your legs or feet.  Wear nonskid shoes or slip-resistant socks when walking while wearing compression stockings. Contact a health care provider and remove your stockings if you have:  A feeling of pins and needles in your feet or  legs.  Open sores, red spots, or other skin changes on your feet or legs.  Swelling or pain that gets worse. Get help right away if you have:  Numbness or tingling in your lower legs that does not get better right after you take the stockings off.  Toes or feet that are unusually cold or turn a bluish color.  A warm or red area on your leg.  New swelling or soreness in your leg.  Shortness of breath.  Chest pain.  A fast or irregular heartbeat.  Light-headedness.  Dizziness. Summary  Compression stockings are elastic socks that squeeze the legs.  They help increase blood flow (circulation) to the legs, decrease swelling in the legs, and reduce the chance of developing blood clots in the lower legs.  Follow instructions from your health care provider about how and when to wear your compression stockings.  Do not stop wearing your compression stockings without talking to your health care provider first. This information is not intended to replace advice given to you by your health care provider. Make sure you discuss any questions you have with your health care provider. Document Revised: 02/18/2017 Document Reviewed: 02/18/2017 Elsevier Patient Education  2020 [employer].

## 2019-03-22 ENCOUNTER — Ambulatory Visit: Payer: Medicare Other | Attending: Internal Medicine

## 2019-03-22 DIAGNOSIS — Z23 Encounter for immunization: Secondary | ICD-10-CM | POA: Insufficient documentation

## 2019-03-22 NOTE — Progress Notes (Signed)
   Covid-19 Vaccination Clinic  Name:  Julie Boone    MRN: [id number] DOB: 1924/11/23  03/22/2019  Julie Boone was observed post Covid-19 immunization for 15 minutes without incidence. She was provided with Vaccine Information Sheet and instruction to access the V-Safe system.   Julie Boone was instructed to call 911 with any severe reactions post vaccine: [name] Difficulty breathing  . Swelling of your face and throat  . A fast heartbeat  . A bad rash all over your body  . Dizziness and weakness    Immunizations Administered    Name Date Dose VIS Date Route   Pfizer COVID-19 Vaccine 03/22/2019  1:24 PM 0.3 mL 02/10/2019 Intramuscular   Manufacturer: [place]   Lot: [id number]   [place]: [id number]

## 2019-03-30 ENCOUNTER — Other Ambulatory Visit: Payer: Self-pay | Admitting: Internal Medicine

## 2019-04-10 ENCOUNTER — Ambulatory Visit: Payer: Medicare Other | Attending: Internal Medicine

## 2019-04-10 DIAGNOSIS — Z23 Encounter for immunization: Secondary | ICD-10-CM | POA: Insufficient documentation

## 2019-04-10 NOTE — Progress Notes (Signed)
   Covid-19 Vaccination Clinic  Name:  Julie Boone    MRN: [id number] DOB: 1924/08/17  04/10/2019  [name] was observed post Covid-19 immunization for 30 minutes based on pre-vaccination screening without incidence. She was provided with Vaccine Information Sheet and instruction to access the V-Safe system.   [name] was instructed to call 911 with any severe reactions post vaccine: [name] Difficulty breathing  . Swelling of your face and throat  . A fast heartbeat  . A bad rash all over your body  . Dizziness and weakness    Immunizations Administered    Name Date Dose VIS Date Route   Pfizer COVID-19 Vaccine 04/10/2019 10:12 AM 0.3 mL 02/10/2019 Intramuscular   Manufacturer: [place]   Lot: [id number]   [place]: [id number]

## 2019-06-12 ENCOUNTER — Other Ambulatory Visit: Payer: Self-pay

## 2019-06-13 MED ORDER — MECLIZINE HCL 25 MG PO TABS: ORAL_TABLET | ORAL | 0 refills | Status: DC

## 2019-07-10 ENCOUNTER — Other Ambulatory Visit: Payer: Self-pay

## 2019-07-10 ENCOUNTER — Ambulatory Visit (INDEPENDENT_AMBULATORY_CARE_PROVIDER_SITE_OTHER): Payer: Medicare Other | Admitting: Internal Medicine

## 2019-07-10 ENCOUNTER — Encounter: Payer: Self-pay | Admitting: Internal Medicine

## 2019-07-10 VITALS — BP 128/76 | HR 67 | Temp 97.4°F | Ht 65.0 in | Wt 166.0 lb

## 2019-07-10 DIAGNOSIS — G629 Polyneuropathy, unspecified: Secondary | ICD-10-CM

## 2019-07-10 DIAGNOSIS — I10 Essential (primary) hypertension: Secondary | ICD-10-CM

## 2019-07-10 DIAGNOSIS — R079 Chest pain, unspecified: Secondary | ICD-10-CM | POA: Diagnosis not present

## 2019-07-10 DIAGNOSIS — E785 Hyperlipidemia, unspecified: Secondary | ICD-10-CM

## 2019-07-10 DIAGNOSIS — Z79899 Other long term (current) drug therapy: Secondary | ICD-10-CM

## 2019-07-10 DIAGNOSIS — K219 Gastro-esophageal reflux disease without esophagitis: Secondary | ICD-10-CM

## 2019-07-10 MED ORDER — LISINOPRIL 40 MG PO TABS: 40.0000 mg | ORAL_TABLET | Freq: Every day | ORAL | 1 refills | Status: DC

## 2019-07-10 MED ORDER — GABAPENTIN 100 MG PO CAPS: 100.0000 mg | ORAL_CAPSULE | Freq: Every day | ORAL | 1 refills | Status: DC

## 2019-07-10 MED ORDER — PANTOPRAZOLE SODIUM 40 MG PO TBEC: 40.0000 mg | DELAYED_RELEASE_TABLET | Freq: Every day | ORAL | 0 refills | Status: DC

## 2019-07-10 NOTE — Patient Instructions (Addendum)
  Feet toe pain seems like neuropathy checking labs for this . Can try gabapentin 100 mg at night  And can increase to 200 mg and then 300 mg at night if needed to see if helps sx .caution with grogginess.  BP is so good today   Can try taking 1/2 lisinopril to 20 mg per day and follow bp  Readings  But not stop at this time.   The chest  Discomfort may be  Reflux and we can restart the protonix for every day for now   Plan virtual or other visit in about a monthy to check all of the above .   Then go from  there.  I agree to minimize   Medication as possible in future

## 2019-07-10 NOTE — Progress Notes (Signed)
This visit occurred during the SARS-CoV-2 public health emergency.  Safety protocols were in place, including screening questions prior to the visit, additional usage of staff PPE, and extensive cleaning of exam room while observing appropriate contact time as indicated for disinfecting solutions.    Chief Complaint  Patient presents with  . Gastroesophageal Reflux    dull ache in chest, comes and goes, also wants her feet checked and feels like hot pins and needles at night    HPI: Julie Boone 86 y.o. come in for indigestion  sx  Here with daughter [name]  Followed cards for hx acute PE  Noted to have gerd  Seen dr Rush Landmark in  [place] given protonix and iralax as needed and levsin as needed  consdiering endo  Has been off protonix fpr a number of months wants to minimize medication   Has an ache left parasternal chest  and not regularly comes and goes . May be when sitting on couch in evening feels has off and on    "Pain for a year " no assoc sx with this  diarhpreis sob      Feet exp at night   Feels burning and  problematic effect sleep  kicks up with certain foods andwith wine . Comes at night Had episode of swelling and breathing ithcing and redness after eating salmon   So given benadryl and ok   Cannot have shrimp either  No hives.   Taking D   ? And  B12?    Asks if can take less BP med and stop one has been on for years      ROS: See pertinent positives and negatives per HPI. No syncope  Breast lumps osb fall   Hearing still dec loves with daughter   Past Medical History:  Diagnosis Date  . Acute sinusitis with symptoms greater than 10 days 06/23/2013  . Arthritis   . Breast cancer ([place])    mastectomy 3   neg ln   . HOH (hard of hearing)   . HTN (hypertension)   . Wears glasses   . Wears hearing aid    both ears    Family History  Problem Relation Age of Onset  . Hypertension Mother   . Stroke Mother   . Heart failure Mother   . Lung disease Sister         smoker  . Colon cancer Father        dx in his late 65's  . Esophageal cancer Neg Hx   . Inflammatory bowel disease Neg Hx   . Liver disease Neg Hx   . Pancreatic cancer Neg Hx   . Stomach cancer Neg Hx     Social History   Socioeconomic History  . Marital status: Widowed    Spouse name: Not on file  . Number of children: 1  . Years of education: 62  . Highest education level: High school graduate  Occupational History    Comment: retired  Tobacco Use  . Smoking status: Never Smoker  . Smokeless tobacco: Never Used  Substance and Sexual Activity  . Alcohol use: Yes    Alcohol/week: 0.0 standard drinks    Comment: 1 time a month. has a sip ever now and then   . Drug use: No  . Sexual activity: Not on file  Other Topics Concern  . Not on file  Social History Narrative   2 people living in the home   Dog Small  yorkie and chihuahua   From [place] moved from [place]  Adopted daughter nearby.      Neg ets  Wine with meals  No tob rd.   G0P0   Married husband [place] background  H[place]elf [place] descent.   Husband passed CHF and prostate cancer 2017   Now living with daughter         Social Determinants of Health   Financial Resource Strain: Low Risk   . Difficulty of Paying Living Expenses: Not hard at all  Food Insecurity: No Food Insecurity  . Worried About Charity fundraiser in the Last Year: Never true  . Ran Out of Food in the Last Year: Never true  Transportation Needs: No Transportation Needs  . Lack of Transportation (Medical): No  . Lack of Transportation (Non-Medical): No  Physical Activity: Inactive  . Days of Exercise per Week: 0 days  . Minutes of Exercise per Session: 0 min  Stress:   . Feeling of Stress :   Social Connections: Unknown  . Frequency of Communication with Friends and Family: More than three times a week  . Frequency of Social Gatherings with Friends and Family: Not on file  . Attends Religious Services: Not on file  . Active Member  of Clubs or Organizations: Not on file  . Attends Archivist Meetings: Not on file  . Marital Status: Widowed    Outpatient Medications Prior to Visit  Medication Sig Dispense Refill  . apixaban (ELIQUIS) 5 MG TABS tablet Take 1 tablet (5 mg total) by mouth 2 (two) times daily. 180 tablet 3  . atenolol (TENORMIN) 50 MG tablet TAKE 1 TABLET (50 MG TOTAL) BY MOUTH DAILY. TAKE 1 AND 1/2 TABLETS EVERY DAY 135 tablet 1  . Cholecalciferol (VITAMIN D-3) 125 MCG (5000 UT) TABS Take 1 tablet by mouth every other day.     . meclizine (ANTIVERT) 25 MG tablet 1/2 to 1  po up to every 8 hours as needed for vertigo  Fall precautions 20 tablet 0  . lisinopril (ZESTRIL) 40 MG tablet TAKE 1 TABLET BY MOUTH EVERY DAY 90 tablet 1  . hyoscyamine (LEVSIN SL) 0.125 MG SL tablet PLACE 1 TABLET UNDER THE TONGUE EVERY 6 (SIX) HOURS AS NEEDED. 120 tablet 0  . pantoprazole (PROTONIX) 40 MG tablet Take 1 tablet (40 mg total) by mouth daily before breakfast. (Patient not taking: Reported on 07/10/2019) 90 tablet 3   No facility-administered medications prior to visit.     EXAM:  BP 128/76   Pulse 67   Temp (!) 97.4 F (36.3 C) (Temporal)   Ht 5\' 5"  (1.651 m)   Wt 166 lb (75.3 kg)   SpO2 98%   BMI 27.62 kg/m   Body mass index is 27.62 kg/m.  GENERAL: vitals reviewed and listed above, alert, oriented, appears well hydrated and in no acute distresshard of hearing somewhat  HEENT: atraumatic, conjunctiva  clear, no obvious abnormalities on inspection of external nose and ears OP : masked  NECK: no obvious masses on inspection palpation  LUNGS: clear to auscultation bilaterally, no wheezes, rales or rhonchi, good air movement  Kyphosis  CV: HRRR, no clubbing cyanosis or  peripheral edema nl cap refill   VV many purplish fine on feet and ankles   Abdomen:  Sof,t normal bowel sounds without hepatosplenomegaly, no guarding rebound or masses no CVA tenderness MS: moves all extremities without noticeable  focal  Abnormality  Cap refill olk no callous   Or  lesion pulses feet are strong  And intact  PSYCH: pleasant and cooperative, no obvious depression or anxiety Lab Results  Component Value Date   WBC 10.5 10/24/2018   HGB 14.1 10/24/2018   HCT 42.1 10/24/2018   PLT 243.0 10/24/2018   GLUCOSE 107 (H) 10/24/2018   CHOL 204 (H) 10/31/2014   TRIG 269.0 (H) 10/31/2014   HDL 32.60 (L) 10/31/2014   LDLDIRECT 110.0 10/31/2014   ALT 22 10/24/2018   AST 27 10/24/2018   NA 140 10/24/2018   K 4.1 10/24/2018   CL 106 10/24/2018   CREATININE 0.94 10/24/2018   BUN 25 (H) 10/24/2018   CO2 24 10/24/2018   TSH 1.52 10/24/2018   INR 0.93 04/01/2018   HGBA1C 6.3 10/24/2018   BP Readings from Last 3 Encounters:  07/10/19 128/76  03/09/19 (!) 166/80  02/27/19 (!) 154/76    ASSESSMENT AND PLAN:  Discussed the following assessment and plan:  Left-sided chest pain - Plan: Basic metabolic panel, CBC with Differential/Platelet, Hemoglobin A1c, Hepatic function panel, Lipid panel, TSH, T4, free, Vitamin B12, Hepatitis C antibody  Medication management - Plan: Basic metabolic panel, CBC with Differential/Platelet, Hemoglobin A1c, Hepatic function panel, Lipid panel, TSH, T4, free, Vitamin B12, Hepatitis C antibody  Essential hypertension - Plan: Basic metabolic panel, CBC with Differential/Platelet, Hemoglobin A1c, Hepatic function panel, Lipid panel, TSH, T4, free, Vitamin B12, Hepatitis C antibody  Hyperlipidemia, unspecified hyperlipidemia type - Plan: Basic metabolic panel, CBC with Differential/Platelet, Hemoglobin A1c, Hepatic function panel, Lipid panel, TSH, T4, free, Vitamin B12, Hepatitis C antibody  Neuropathy presumed  burning  feet  - Plan: Basic metabolic panel, CBC with Differential/Platelet, Hemoglobin A1c, Hepatic function panel, Lipid panel, TSH, T4, free, Vitamin B12, Hepatitis C antibody  Gastroesophageal reflux disease, unspecified whether esophagitis present - Plan: Basic  metabolic panel, CBC with Differential/Platelet, Hemoglobin A1c, Hepatic function panel, Lipid panel, TSH, T4, free, Vitamin B12, Hepatitis C antibody Chest ache poss gi related  No assoc  sx  Consider cards eval if typical but add back Protonix for now  bp  Is good today can try 20 mg lis but want some control   de prescribing  Considered at some point.  Feel sx may be neuropathy  Can try empiric low dose  Gabapentin with cautions  Fu all 4 issues in about a month  Bp ,neuropathy feet sx,  Chest ache,medicatoins   Labs today  While   In office    -Patient advised to return or notify health care team  if  new concerns arise.  Patient Instructions   Feet toe pain seems like neuropathy checking labs for this . Can try gabapentin 100 mg at night  And can increase to 200 mg and then 300 mg at night if needed to see if helps sx .caution with grogginess.  BP is so good today   Can try taking 1/2 lisinopril to 20 mg per day and follow bp  Readings  But not stop at this time.   The chest  Discomfort may be  Reflux and we can restart the protonix for every day for now   Plan virtual or other visit in about a monthy to check all of the above .   Then go from  there.  I agree to minimize   Medication as possible in future   Nelson K. Jakirah Zaun M.D.

## 2019-07-11 LAB — HEMOGLOBIN A1C: Hgb A1c MFr Bld: 6.2 % (ref 4.6–6.5)

## 2019-07-11 LAB — CBC WITH DIFFERENTIAL/PLATELET
Basophils Absolute: 0.1 10*3/uL (ref 0.0–0.1)
Basophils Relative: 1.3 % (ref 0.0–3.0)
Eosinophils Absolute: 0.3 10*3/uL (ref 0.0–0.7)
Eosinophils Relative: 3 % (ref 0.0–5.0)
HCT: 43.8 % (ref 36.0–46.0)
Hemoglobin: 14.8 g/dL (ref 12.0–15.0)
Lymphocytes Relative: 23.2 % (ref 12.0–46.0)
Lymphs Abs: 2.5 10*3/uL (ref 0.7–4.0)
MCHC: 33.7 g/dL (ref 30.0–36.0)
MCV: 98.2 fl (ref 78.0–100.0)
Monocytes Absolute: 1.1 10*3/uL — ABNORMAL HIGH (ref 0.1–1.0)
Monocytes Relative: 10.1 % (ref 3.0–12.0)
Neutro Abs: 6.7 10*3/uL (ref 1.4–7.7)
Neutrophils Relative %: 62.4 % (ref 43.0–77.0)
Platelets: 284 10*3/uL (ref 150.0–400.0)
RBC: 4.46 Mil/uL (ref 3.87–5.11)
RDW: 12.8 % (ref 11.5–15.5)
WBC: 10.7 10*3/uL — ABNORMAL HIGH (ref 4.0–10.5)

## 2019-07-11 LAB — LIPID PANEL
Cholesterol: 193 mg/dL (ref 0–200)
HDL: 28.5 mg/dL — ABNORMAL LOW (ref >39.00)
Total CHOL/HDL Ratio: 7
Triglycerides: 428 mg/dL — ABNORMAL HIGH (ref 0.0–149.0)

## 2019-07-11 LAB — VITAMIN B12: Vitamin B-12: 994 pg/mL — ABNORMAL HIGH (ref 211–911)

## 2019-07-11 LAB — HEPATIC FUNCTION PANEL
ALT: 25 U/L (ref 0–35)
AST: 29 U/L (ref 0–37)
Albumin: 4.1 g/dL (ref 3.5–5.2)
Alkaline Phosphatase: 84 U/L (ref 39–117)
Bilirubin, Direct: 0.1 mg/dL (ref 0.0–0.3)
Total Bilirubin: 0.6 mg/dL (ref 0.2–1.2)
Total Protein: 6.3 g/dL (ref 6.0–8.3)

## 2019-07-11 LAB — TSH: TSH: 2.27 u[IU]/mL (ref 0.35–4.50)

## 2019-07-11 LAB — BASIC METABOLIC PANEL
BUN: 26 mg/dL — ABNORMAL HIGH (ref 6–23)
CO2: 28 mEq/L (ref 19–32)
Calcium: 10 mg/dL (ref 8.4–10.5)
Chloride: 104 mEq/L (ref 96–112)
Creatinine, Ser: 1.1 mg/dL (ref 0.40–1.20)
GFR: 46.32 mL/min — ABNORMAL LOW (ref >60.00)
Glucose, Bld: 106 mg/dL — ABNORMAL HIGH (ref 70–99)
Potassium: 4.8 mEq/L (ref 3.5–5.1)
Sodium: 142 mEq/L (ref 135–145)

## 2019-07-11 LAB — HEPATITIS C ANTIBODY
Hepatitis C Ab: NONREACTIVE
SIGNAL TO CUT-OFF: 0 (ref <1.00)

## 2019-07-11 LAB — T4, FREE: Free T4: 0.73 ng/dL (ref 0.60–1.60)

## 2019-07-11 LAB — LDL CHOLESTEROL, DIRECT: Direct LDL: 103 mg/dL

## 2019-07-11 NOTE — Progress Notes (Signed)
So labs stable blood sugar  no diabetes  triglycerids after eating way up    b12 level high and thyroid normal .  So  limit ,moderate sugars and simple  carbs as best possible   that can decrease the triglycerides

## 2019-07-13 ENCOUNTER — Encounter (INDEPENDENT_AMBULATORY_CARE_PROVIDER_SITE_OTHER): Payer: Self-pay | Admitting: Otolaryngology

## 2019-07-13 ENCOUNTER — Ambulatory Visit (INDEPENDENT_AMBULATORY_CARE_PROVIDER_SITE_OTHER): Payer: Medicare Other | Admitting: Otolaryngology

## 2019-07-13 ENCOUNTER — Other Ambulatory Visit: Payer: Self-pay

## 2019-07-13 VITALS — Temp 97.7°F

## 2019-07-13 DIAGNOSIS — H6123 Impacted cerumen, bilateral: Secondary | ICD-10-CM

## 2019-07-13 DIAGNOSIS — H903 Sensorineural hearing loss, bilateral: Secondary | ICD-10-CM

## 2019-07-13 NOTE — Progress Notes (Addendum)
HPI: Julie Boone is a 86 y.o. female who presents for evaluation of cerumen buildup in her ears.  She is also getting her hearing aids [place] that needed to be cleaned..  Past Medical History:  Diagnosis Date  . Acute sinusitis with symptoms greater than 10 days 06/23/2013  . Arthritis   . Breast cancer ([facility])    mastectomy 3   neg ln   . HOH (hard of hearing)   . HTN (hypertension)   . Wears glasses   . Wears hearing aid    both ears   Past Surgical History:  Procedure Laterality Date  . APPENDECTOMY  2006  . BREAST BIOPSY Left 2012  . COLONOSCOPY    . HERNIA REPAIR    . INSERTION OF MESH N/A 09/05/2013   Procedure: INSERTION OF MESH;  Surgeon: Joyice Faster. Cornett, MD;  Location: [facility];  Service: General;  Laterality: N/A;  . MASTECTOMY Left 2013  . TONSILLECTOMY AND ADENOIDECTOMY     Childhood  . UMBILICAL HERNIA REPAIR N/A 09/05/2013   Procedure: HERNIA REPAIR UMBILICAL ADULT WITH MESH;  Surgeon: Joyice Faster. Cornett, MD;  Location: [facility];  Service: General;  Laterality: N/A;   Social History   Socioeconomic History  . Marital status: Widowed    Spouse name: Not on file  . Number of children: 1  . Years of education: 27  . Highest education level: High school graduate  Occupational History    Comment: retired  Tobacco Use  . Smoking status: Never Smoker  . Smokeless tobacco: Never Used  Substance and Sexual Activity  . Alcohol use: Yes    Alcohol/week: 0.0 standard drinks    Comment: 1 time a month. has a sip ever now and then   . Drug use: No  . Sexual activity: Not on file  Other Topics Concern  . Not on file  Social History Narrative   2 people living in the home   Dog Small yorkie and [place]   From [place] moved from [place]  Adopted daughter nearby.      Neg ets  Wine with meals  No tob rd.   G0P0   Married husband [place] background  [place]elf [place] descent.   Husband passed CHF and prostate cancer 2017   Now living with daughter         Social Determinants of Health   Financial Resource Strain: Low Risk   . Difficulty of Paying Living Expenses: Not hard at all  Food Insecurity: No Food Insecurity  . Worried About Charity fundraiser in the Last Year: Never true  . Ran Out of Food in the Last Year: Never true  Transportation Needs: No Transportation Needs  . Lack of Transportation (Medical): No  . Lack of Transportation (Non-Medical): No  Physical Activity: Inactive  . Days of Exercise per Week: 0 days  . Minutes of Exercise per Session: 0 min  Stress:   . Feeling of Stress :   Social Connections: Unknown  . Frequency of Communication with Friends and Family: More than three times a week  . Frequency of Social Gatherings with Friends and Family: Not on file  . Attends Religious Services: Not on file  . Active Member of Clubs or Organizations: Not on file  . Attends Archivist Meetings: Not on file  . Marital Status: Widowed   Family History  Problem Relation Age of Onset  . Hypertension Mother   . Stroke Mother   .  Heart failure Mother   . Lung disease Sister        smoker  . Colon cancer Father        dx in his late 52's  . Esophageal cancer Neg Hx   . Inflammatory bowel disease Neg Hx   . Liver disease Neg Hx   . Pancreatic cancer Neg Hx   . Stomach cancer Neg Hx    Allergies  Allergen Reactions  . Morphine And Related Nausea And Vomiting   Prior to Admission medications   Medication Sig Start Date End Date Taking? Authorizing Provider  apixaban (ELIQUIS) 5 MG TABS tablet Take 1 tablet (5 mg total) by mouth 2 (two) times daily. 03/09/19  Yes Elouise Munroe, MD  atenolol (TENORMIN) 50 MG tablet TAKE 1 TABLET (50 MG TOTAL) BY MOUTH DAILY. TAKE 1 AND 1/2 TABLETS EVERY DAY 03/30/19  Yes Panosh, Standley Brooking, MD  Cholecalciferol (VITAMIN D-3) 125 MCG (5000 UT) TABS Take 1 tablet by mouth every other day.    Yes [provider]  gabapentin (NEURONTIN) 100  MG capsule Take 1 capsule (100 mg total) by mouth at bedtime. Increase to 200 mg and then 300 mg  As  Indicated 07/10/19  Yes Panosh, Standley Brooking, MD  lisinopril (ZESTRIL) 40 MG tablet Take 1 tablet (40 mg total) by mouth daily. May decrease to 20 mg per day as indicated 07/10/19  Yes Panosh, Standley Brooking, MD  meclizine (ANTIVERT) 25 MG tablet 1/2 to 1  po up to every 8 hours as needed for vertigo  Fall precautions 06/13/19  Yes Panosh, Standley Brooking, MD  pantoprazole (PROTONIX) 40 MG tablet Take 1 tablet (40 mg total) by mouth daily before breakfast. 07/10/19  Yes Panosh, Standley Brooking, MD     Positive ROS: Otherwise negative  All other systems have been reviewed and were otherwise negative with the exception of those mentioned in the HPI and as above.  Physical Exam: Constitutional: Alert, well-appearing, no acute distress Ears: External ears without lesions or tenderness. Ear canals reveal a moderate amount o wax in both ear canals that was cleaned with forceps and curette.    After cleaning the ear canals TMs were clear bilaterally.  She has moderate bilateral SNHL and needs hearing aids.. Nasal: External nose without lesions. Clear nasal passages Oral: Oropharynx clear. Neck: No palpable adenopathy or masses Respiratory: Breathing comfortably  Skin: No facial/neck lesions or rash noted.  Cerumen impaction removal  Date/Time: 07/13/2019 4:34 PM Performed by: [name], MD Authorized by: [name], MD   Consent:    Consent obtained:  Verbal   Consent given by:  Patient   Risks discussed:  Pain and bleeding Procedure details:    Location:  L ear and R ear   Procedure type: suction and forceps   Post-procedure details:    Inspection:  TM intact and canal normal   Hearing quality:  Improved   Patient tolerance of procedure:  Tolerated well, no immediate complications Comments:     TMs are clear bilaterally    Assessment: Bilateral cerumen impactions with dry wax in both ear  canals.  Plan: She will follow-up as needed.  Radene Journey, MD

## 2019-07-19 ENCOUNTER — Telehealth: Payer: Self-pay | Admitting: Internal Medicine

## 2019-07-19 NOTE — Chronic Care Management (AMB) (Signed)
  Chronic Care Management   Note  07/19/2019 Name: Julie Boone MRN: [id number] DOB: [id number]  Julie Boone is a 86 y.o. year old female who is a primary care patient of Panosh, Standley Brooking, MD. I reached out to [name] by phone today in response to a referral sent by [name]'s PCP, Panosh, Standley Brooking, MD.   [name] was given information about Chronic Care Management services today including:  1. CCM service includes personalized support from designated clinical staff supervised by her physician, including individualized plan of care and coordination with other care providers 2. 24/7 contact phone numbers for assistance for urgent and routine care needs. 3. Service will only be billed when office clinical staff spend 20 minutes or more in a month to coordinate care. 4. Only one practitioner may furnish and bill the service in a calendar month. 5. The patient may stop CCM services at any time (effective at the end of the month) by phone call to the office staff.   Patient agreed to services and verbal consent obtained.   Follow up plan:  [name]

## 2019-08-28 ENCOUNTER — Other Ambulatory Visit: Payer: Self-pay

## 2019-08-28 ENCOUNTER — Ambulatory Visit: Payer: Medicare Other

## 2019-08-28 DIAGNOSIS — E785 Hyperlipidemia, unspecified: Secondary | ICD-10-CM

## 2019-08-28 DIAGNOSIS — K219 Gastro-esophageal reflux disease without esophagitis: Secondary | ICD-10-CM

## 2019-08-28 DIAGNOSIS — I10 Essential (primary) hypertension: Secondary | ICD-10-CM

## 2019-08-28 DIAGNOSIS — R739 Hyperglycemia, unspecified: Secondary | ICD-10-CM

## 2019-08-28 DIAGNOSIS — Z86711 Personal history of pulmonary embolism: Secondary | ICD-10-CM

## 2019-08-28 NOTE — Chronic Care Management (AMB) (Signed)
Chronic Care Management Pharmacy  Name: Julie Boone  MRN: [id number] DOB: 09-10-1924  Initial Questions: 1. Have you seen any other providers since your last visit? NA 2. Any changes in your medicines or health? No   Chief Complaint/ HPI  Julie Boone,  86 y.o. , female presents for their Initial CCM visit with the clinical pharmacist via telephone due to COVID-19 Pandemic.  Patient and daughter were both present on the phone for visit. Patient reports she is doing well overall and lives with daughter who helps take care of her. Sugars have been eliminated from diet to help with elevated triglycerides.   PCP : Burnis Medin, MD  Their chronic conditions include: HTN, PE, HLD, GERD, elevated blood sugar, Sleep, Vertigo  Office Visits: 07/10/2019- [name], MD- Patient presented for office visit for indigestion sx. Routine labs ordered. Patient to go back on pantoprazole. For BP, patient to try lisinopril 54m (decrease from 433m. Patient to trial gabapentin for neuropathy sx. Patient to follow up in 1 month.   Consult Visit: 07/13/2019- Otolaryngology- ChMelony OverlyMD- Patient presented for office visit for cerumen impaction removal. Patient to follow up as needed.   03/09/2019- Cardiology- GaCherlynn KaiserMD- Patient presented for virtual visit for PE follow up. Likely will need lifelong anticoagulation. Patient to follow up in 6 months.   01/12/2019- Gastroenterology- GaIrving Copas MD- Patient presented for office visit for GERD, lower abdominal pain, constipation. Pepcid d/c and patient instructed to take PPI 30 minutes before breakfast daily. Continue hyoscyamine.  Patient to begin Miralax for BM. Patient to obtain labs and return in 4 weeks. Diagnositc upper and lower endoscopy to be considered if still having issues.   Medications: Outpatient Encounter Medications as of 08/28/2019  Medication Sig  . apixaban (ELIQUIS) 5 MG TABS tablet Take 1  tablet (5 mg total) by mouth 2 (two) times daily.  . Marland Kitchentenolol (TENORMIN) 50 MG tablet TAKE 1 TABLET (50 MG TOTAL) BY MOUTH DAILY. TAKE 1 AND 1/2 TABLETS EVERY DAY  . Cholecalciferol (VITAMIN D-3) 125 MCG (5000 UT) TABS Take 1 tablet by mouth daily.   . Cyanocobalamin (VITAMIN B12) 3000 MCG SUBL Place 1 tablet under the tongue daily.  . Marland Kitchenisinopril (ZESTRIL) 40 MG tablet Take 1 tablet (40 mg total) by mouth daily. May decrease to 20 mg per day as indicated (Patient taking differently: Take 20 mg by mouth daily. May decrease to 20 mg per day as indicated)  . meclizine (ANTIVERT) 25 MG tablet 1/2 to 1  po up to every 8 hours as needed for vertigo  Fall precautions  . gabapentin (NEURONTIN) 100 MG capsule Take 1 capsule (100 mg total) by mouth at bedtime. Increase to 200 mg and then 300 mg  As  Indicated (Patient not taking: Reported on 08/28/2019)  . pantoprazole (PROTONIX) 40 MG tablet Take 1 tablet (40 mg total) by mouth daily before breakfast. (Patient not taking: Reported on 08/28/2019)   No facility-administered encounter medications on file as of 08/28/2019.     Current Diagnosis/Assessment:  Goals Addressed            This Visit's Progress   . Pharmacy Care Plan       CARE PLAN ENTRY (see longitudinal plan of care for additional care plan information)  Current Barriers:  . Chronic Disease Management support, education, and care coordination needs related to Hypertension, Hyperlipidemia, Diabetes, GERD, and Pulmonary embolism   Hypertension BP Readings from Last 3 Encounters:  07/10/19 128/76  03/09/19 (!) 166/80  02/27/19 (!) 154/76   . Pharmacist Clinical Goal(s): o Over the next 90 days, patient will work with PharmD and providers to maintain BP goal <140/90 . Current regimen:   Atenolol 91m,1 tablet once daily (AM)  Lisinopril 22m 1 tablet once daily.(PM) . Interventions: o DASH diet:  following a diet emphasizing fruits and vegetables and low-fat dairy products along  with whole grains, fish, poultry, and nuts. Reducing red meats and sugars . Patient self care activities - Over the next 90 days, patient will: o Check BP at least 1 to 2 times per week, document, and provide at future appointments o Ensure daily salt intake < 2300 mg/day  Hyperlipidemia Lab Results  Component Value Date/Time   LDLDIRECT 103.0 07/10/2019 03:37 PM   . Pharmacist Clinical Goal(s): o Over the next 90 days, patient will work with PharmD and providers to achieve triglycerides goal < 150 . Current regimen:  o No medications (lifestyle interventions)  . Interventions: . How to reduce cholesterol through diet/weight management and physical activity.    . We discussed how a diet high in plant sterols (fruits/vegetables/nuts/whole grains/legumes) may reduce your cholesterol.  Encouraged increasing fiber to a daily intake of 10-25g/day  . Patient self care activities - Over the next 90 days, patient will: o Will continue to eliminate sugars from diet and maintain active.  Elevated blood sugar Lab Results  Component Value Date/Time   HGBA1C 6.2 07/10/2019 03:37 PM   HGBA1C 6.3 10/24/2018 04:32 PM   . Pharmacist Clinical Goal(s): o Over the next 90 days, patient will work with PharmD and providers to maintain A1c goal <6.5% . Current regimen:  o No medications  . Patient self care activities - Over the next 90 days, patient will: o Continue diet modifications and maintaining active.   History of pulmonary embolism  . Pharmacist Clinical Goal(s) o Over the next 90 days, patient will work with PharmD and providers to decrease risk of blood clots.  . Current regimen:  o apixaban (Eliquis) 47m89m tablet twice daily  . Interventions: o We discussed:  monitoring for signs and symptoms for bleeding (coughing up blood, prolonged nose bleeds, black, tarry stools).  . Patient self care activities - Over the next 90 days, patient will: o Continue current medications as directed.    GERD . Pharmacist Clinical Goal(s) o Over the next 90 days, patient will work with PharmD and providers to maintain/ minimize acid reflux symptoms.  . Current regimen:  o No medications.  . Interventions: o We discussed:  non-pharmacological interventions for acid reflux. Take measures to prevent acid reflux, such as avoiding spicy foods, avoiding caffeine, avoid laying down a few hours after eating, and raising the head of the bed. . Patient self care activities o Patient will continue avoiding triggers (wine).   Medication management . Pharmacist Clinical Goal(s): o Over the next 90 days, patient will work with PharmD and providers to maintain optimal medication adherence . [facility] Interventions o Comprehensive medication review performed. o Continue current medication management strategy . Patient self care activities - Over the next 90 days, patient will: o Take medications as prescribed o Report any questions or concerns to PharmD and/or provider(s)  Initial goal documentation       SDOH Interventions     Most Recent Value  SDOH Interventions  Financial Strain Interventions Other (Comment)  [Patient does not qualify for Extra Help. Provided patient assistance application for Eliqus]  Transportation  Interventions Intervention Not Indicated       History of PE   Patient states she is in process of seeing hospice and will asking cardiologist is possible to see if can stop.   Reports cost of Eliquis is concerning for her. 90DS- $147   Patient has failed these meds in past: none   Patient is currently controlled on the following medications:  . apixaban (Eliquis) 30m,1 tablet twice daily   We discussed:  monitoring for signs and symptoms for bleeding (coughing up blood, prolonged nose bleeds, black, tarry stools).   Plan Continue current medications  Provided PAP information for [employer]    Elevated blood sugar     Recent Relevant Labs: Lab Results  Component Value Date/Time   HGBA1C 6.2 07/10/2019 03:37 PM   HGBA1C 6.3 10/24/2018 04:32 PM    Patient has failed these meds in past: none  Patient is currently controlled on the following medications:   No medications   Last diabetic Eye exam: No results found for: HMDIABEYEEXA   Last diabetic Foot exam: No results found for: HMDIABFOOTEX   We discussed: diet and exercise extensively  Plan Continue control with diet and exercise    Hyperlipidemia   LDL goal < 100  Lipid Panel     Component Value Date/Time   CHOL 193 07/10/2019 1537   TRIG (H) 07/10/2019 1537    428.0 Triglyceride is over 400; calculations on Lipids are invalid.   HDL 28.50 (L) 07/10/2019 1537   LDLDIRECT 103.0 07/10/2019 1537    Hepatic Function Latest Ref Rng & Units 07/10/2019 10/24/2018 03/30/2018  Total Protein 6.0 - 8.3 g/dL 6.3 6.6 6.3  Albumin 3.5 - 5.2 g/dL 4.1 4.3 3.9  AST 0 - 37 U/L _0 ALT 0 - 35 U/L _1 Alk Phosphatase 39 - 117 U/L 84 73 76  Total Bilirubin 0.2 - 1.2 mg/dL 0.6 0.5 0.6  Bilirubin, Direct 0.0 - 0.3 mg/dL 0.1 0.1 0.1     The ASCVD Risk score (GBardwell, et al., 2013) failed to calculate for the following reasons:   The 2013 ASCVD risk score is only valid for ages 41to 7100  Patient is currently controlled on the following medications:  . No medications (lifestyle interventions)   We discussed:  diet and exercise extensively   Plan Continue control with diet and exercise'  Hypertension   Office blood pressures are  BP Readings from Last 3 Encounters:  07/10/19 128/76  03/09/19 (!) 166/80  02/27/19 (!) 154/76   Patient has failed these meds in the past: none   Patient checks BP at home does not monitor at home and patient reports she has no need to    Patient home BP readings are ranging: NA  Patient is controlled on:   Atenolol 59m1 tablet once daily (AM)  Lisinopril 2063m1 tablet once daily.(PM)    We discussed diet and exercise extensively   Plan Continue current medications   GERD   Patient reported she has been watching triggers. She gave up wine and GERD sx have improved.   Patient was on  these meds in past: famotidine, pantoprazole   Patient is currently controlled on the following medications:   Lifestyle modification   We discussed:  non-pharmacological interventions for acid reflux. Take measures to prevent acid reflux, such as avoiding spicy foods, avoiding caffeine, avoid laying down a few hours after eating, and raising the head of the  bed.  Plan Continue as is.   Sleep   Patient reports stopping gabapentin since she has been able to sleep well.   Patient was on  these meds in past: gabapentin   Patient is currently controlled on the following medications:  . No medications   Plan Continue as is.   Vertigo  Only takes as needed. Before going to bed.    Patient is currently controlled on the following medications:  [name] Meclizine 16m, 0.5 to 1 tablet every 8 hours as needed for vertigo   Plan Continue current medications  Vitamin D supplement    No results found for: VD25OH   Patient is currently managed on the following medications:  .Marland KitchenVitamin D3 5000 units, 1 tablet once a day   Plan Continue current medications    Medication Management  Patient organizes medications: patient reports has her own system:  [facility] Adherence:  - atenolol (no recent refill) -- patient reports having supply.   Follow-up Follow up visit with PharmD in 1 month to reassess PAP for apixaban.   AAnson Crofts PharmD Clinical Pharmacist [facility]([phone]

## 2019-08-30 NOTE — Patient Instructions (Addendum)
Visit Information  Goals Addressed            This Julie Boone (see longitudinal plan of care for additional care plan information)  Current Barriers:   Chronic Disease Management support, education, and care coordination needs related to Hypertension, Hyperlipidemia, Diabetes, GERD, and Pulmonary embolism   Hypertension BP Readings from Last 3 Encounters:  07/10/19 128/76  03/09/19 (!) 166/80  02/27/19 (!) 154/76    Pharmacist Clinical Goal(s): o Over the next 90 days, patient will work with PharmD and providers to maintain BP goal <140/90  Current regimen:   Atenolol 50mg ,1 tablet once daily (AM)  Lisinopril 20mg , 1 tablet once daily.(PM)  Interventions: o DASH diet:  following a diet emphasizing fruits and vegetables and low-fat dairy products along with whole grains, fish, poultry, and nuts. Reducing red meats and sugars  Patient self care activities - Over the next 90 days, patient will: o Check BP at least 1 to 2 times per week, document, and provide at future appointments o Ensure daily salt intake < 2300 mg/day  Hyperlipidemia Lab Results  Component Value Date/Time   LDLDIRECT 103.0 07/10/2019 03:37 PM    Pharmacist Clinical Goal(s): o Over the next 90 days, patient will work with PharmD and providers to achieve triglycerides goal < 150  Current regimen:  o No medications (lifestyle interventions)   Interventions:  How to reduce cholesterol through diet/weight management and physical activity.     We discussed how a diet high in plant sterols (fruits/vegetables/nuts/whole grains/legumes) may reduce your cholesterol.  Encouraged increasing fiber to a daily intake of 10-25g/day   Patient self care activities - Over the next 90 days, patient will: o Will continue to eliminate sugars from diet and maintain active.  Elevated blood sugar Lab Results  Component Value Date/Time   HGBA1C 6.2 07/10/2019  03:37 PM   HGBA1C 6.3 10/24/2018 04:32 PM    Pharmacist Clinical Goal(s): o Over the next 90 days, patient will work with PharmD and providers to maintain A1c goal <6.5%  Current regimen:  o No medications   Patient self care activities - Over the next 90 days, patient will: o Continue diet modifications and maintaining active.   History of pulmonary embolism   Pharmacist Clinical Goal(s) o Over the next 90 days, patient will work with PharmD and providers to decrease risk of blood clots.   Current regimen:  o apixaban (Eliquis) 5mg ,1 tablet twice daily   Interventions: o We discussed:  monitoring for signs and symptoms for bleeding (coughing up blood, prolonged nose bleeds, black, tarry stools).   Patient self care activities - Over the next 90 days, patient will: o Continue current medications as directed.   GERD  Pharmacist Clinical Goal(s) o Over the next 90 days, patient will work with PharmD and providers to maintain/ minimize acid reflux symptoms.   Current regimen:  o No medications.   Interventions: o We discussed:  non-pharmacological interventions for acid reflux. Take measures to prevent acid reflux, such as avoiding spicy foods, avoiding caffeine, avoid laying down a few hours after eating, and raising the head of the bed.  Patient self care activities o Patient will continue avoiding triggers (wine).   Medication management  Pharmacist Clinical Goal(s): o Over the next 90 days, patient will work with PharmD and providers to maintain optimal medication adherence  [facility]   Interventions o Comprehensive medication review  performed. o Continue current medication management strategy  Patient self care activities - Over the next 90 days, patient will: o Take medications as prescribed o Report any questions or concerns to PharmD and/or provider(s)  Initial goal documentation        Julie Boone was given information about  Chronic Care Management services today including:  1. CCM service includes personalized support from designated clinical staff supervised by her physician, including individualized plan of care and coordination with other care providers 2. 24/7 contact phone numbers for assistance for urgent and routine care needs. 3. Standard insurance, coinsurance, copays and deductibles apply for chronic care management only during months in which we provide at least 20 minutes of these services. Most insurances cover these services at 100%, however patients may be responsible for any copay, coinsurance and/or deductible if applicable. This service may help you avoid the need for more expensive face-to-face services. 4. Only one practitioner may furnish and bill the service in a calendar month. 5. The patient may stop CCM services at any time (effective at the end of the month) by phone call to the office staff.  Patient agreed to services and verbal consent obtained.   The patient verbalized understanding of instructions provided today and agreed to receive a mailed copy of patient instruction and/or educational materials. The pharmacy team will reach out to the patient again over the next 30 days.    Anson Crofts, PharmD Clinical Pharmacist [facility] [phone]    High Triglycerides Eating Plan Triglycerides are a type of fat in the blood. High levels of triglycerides can increase your risk of heart disease and stroke. If your triglyceride levels are high, choosing the right foods can help lower your triglycerides and keep your heart healthy. Work with your health care provider or a diet and nutrition specialist (dietitian) to develop an eating plan that is right for you. What are tips for following this plan? General guidelines   Lose weight, if you are overweight. For most people, losing 5-10 lbs (2-5 kg) helps lower triglyceride levels. A weight-loss plan may  include. ? 30 minutes of exercise at least 5 days a week. ? Reducing the amount of calories, sugar, and fat you eat.  Eat a wide variety of fresh fruits, vegetables, and whole grains. These foods are high in fiber.  Eat foods that contain healthy fats, such as fatty fish, nuts, seeds, and olive oil.  Avoid foods that are high in added sugar, added salt (sodium), saturated fat, and trans fat.  Avoid low-fiber, refined carbohydrates such as white bread, crackers, noodles, and white rice.  Avoid foods with partially hydrogenated oils (trans fats), such as fried foods or stick margarine.  Limit alcohol intake to no more than 1 drink a day for nonpregnant women and 2 drinks a day for men. One drink equals 12 oz of beer, 5 oz of wine, or 1 oz of hard liquor. Your health care provider may recommend that you drink less depending on your overall health. Reading food labels  Check food labels for the amount of saturated fat. Choose foods with no or very little saturated fat.  Check food labels for the amount of trans fat. Choose foods with no trans fat.  Check food labels for the amount of cholesterol. Choose foods low in cholesterol. Ask your dietitian how much cholesterol you should have each day.  Check food labels for the amount of sodium. Choose foods with less than 140  milligrams (mg) per serving. Shopping  Buy dairy products labeled as nonfat (skim) or low-fat (1%).  Avoid buying processed or prepackaged foods. These are often high in added sugar, sodium, and fat. Cooking  Choose healthy fats when cooking, such as olive oil or canola oil.  Cook foods using lower fat methods, such as baking, broiling, boiling, or grilling.  Make your own sauces, dressings, and marinades when possible, instead of buying them. Store-bought sauces, dressings, and marinades are often high in sodium and sugar. Meal planning  Eat more home-cooked food and less restaurant, buffet, and fast food.  Eat  fatty fish at least 2 times each week. Examples of fatty fish include salmon, trout, mackerel, tuna, and herring.  If you eat whole eggs, do not eat more than 3 egg yolks per week. What foods are recommended? The items listed may not be a complete list. Talk with your dietitian about what dietary choices are best for you. Grains Whole wheat or whole grain breads, crackers, cereals, and pasta. Unsweetened oatmeal. Bulgur. Barley. Quinoa. Brown rice. Whole wheat flour tortillas. Vegetables Fresh or frozen vegetables. Low-sodium canned vegetables. Fruits All fresh, canned (in natural juice), or frozen fruits. Meats and other protein foods Skinless chicken or [place]. Ground chicken or [place]. Lean cuts of pork, trimmed of fat. Fish and seafood, especially salmon, trout, and herring. Egg whites. Dried beans, peas, or lentils. Unsalted nuts or seeds. Unsalted canned beans. Natural peanut or almond butter. Dairy Low-fat dairy products. Skim or low-fat (1%) milk. Reduced fat (2%) and low-sodium cheese. Low-fat ricotta cheese. Low-fat cottage cheese. Plain, low-fat yogurt. Fats and oils Tub margarine without trans fats. Light or reduced-fat mayonnaise. Light or reduced-fat salad dressings. Avocado. Safflower, olive, sunflower, soybean, and canola oils. What foods are not recommended? The items listed may not be a complete list. Talk with your dietitian about what dietary choices are best for you. Grains White bread. White (regular) pasta. White rice. Cornbread. Bagels. Pastries. Crackers that contain trans fat. Vegetables Creamed or fried vegetables. Vegetables in a cheese sauce. Fruits Sweetened dried fruit. Canned fruit in syrup. Fruit juice. Meats and other protein foods Fatty cuts of meat. Ribs. Chicken wings. Berniece Salines. Sausage. Bologna. Salami. Chitterlings. Fatback. Hot dogs. Bratwurst. Packaged lunch meats. Dairy Whole or reduced-fat (2%) milk. Half-and-half. Cream cheese. Full-fat or  sweetened yogurt. Full-fat cheese. Nondairy creamers. Whipped toppings. Processed cheese or cheese spreads. Cheese curds. Beverages Alcohol. Sweetened drinks, such as soda, lemonade, fruit drinks, or punches. Fats and oils Butter. Stick margarine. Lard. Shortening. Ghee. Bacon fat. Tropical oils, such as coconut, palm kernel, or palm oils. Sweets and desserts Corn syrup. Sugars. Honey. Molasses. Candy. Jam and jelly. Syrup. Sweetened cereals. Cookies. Pies. Cakes. Donuts. Muffins. Ice cream. Condiments Store-bought sauces, dressings, and marinades that are high in sugar, such as ketchup and barbecue sauce. Summary  High levels of triglycerides can increase the risk of heart disease and stroke. Choosing the right foods can help lower your triglycerides.  Eat plenty of fresh fruits, vegetables, and whole grains. Choose low-fat dairy and lean meats. Eat fatty fish at least twice a week.  Avoid processed and prepackaged foods with added sugar, sodium, saturated fat, and trans fat.  If you need suggestions or have questions about what types of food are good for you, talk with your health care provider or a dietitian. This information is not intended to replace advice given to you by your health care provider. Make sure you discuss any questions you have with your  health care provider. Document Revised: 01/29/2017 Document Reviewed: 04/21/2016 Elsevier Patient Education  2020 [employer].

## 2019-09-06 ENCOUNTER — Encounter: Payer: Self-pay | Admitting: Internal Medicine

## 2019-09-06 ENCOUNTER — Ambulatory Visit: Payer: Medicare Other | Admitting: Internal Medicine

## 2019-09-06 ENCOUNTER — Other Ambulatory Visit: Payer: Self-pay

## 2019-09-06 VITALS — BP 130/100 | HR 67 | Ht 65.0 in | Wt 166.0 lb

## 2019-09-06 DIAGNOSIS — I480 Paroxysmal atrial fibrillation: Secondary | ICD-10-CM | POA: Diagnosis not present

## 2019-09-06 DIAGNOSIS — I10 Essential (primary) hypertension: Secondary | ICD-10-CM

## 2019-09-06 DIAGNOSIS — Z86711 Personal history of pulmonary embolism: Secondary | ICD-10-CM | POA: Diagnosis not present

## 2019-09-06 NOTE — Progress Notes (Signed)
Cardiology Office Note:    Date:  09/06/2019   ID:  Julie Boone, DOB [id number], MRN [id number]  PCP:  Burnis Medin, MD  Cardiologist:  Elouise Munroe, MD  Electrophysiologist:  None   Referring MD: Burnis Medin, MD   Chief Complaint: Follow-up PE  History of Present Illness:    Julie Boone is a 86 y.o. female with a history of PAF, HTN.  She follows up after an unprovoked PE, continued on Eliquis 5 mg twice daily.  She is doing well overall, has no acute concerns and feels able to do her daily activities.  She is not limited by chest pain, dyspnea at rest or with exertion, palpitations, PND, orthopnea, or leg swelling.  No syncope or presyncope.  No dizziness or lightheadedness.  Past Medical History:  Diagnosis Date   Acute sinusitis with symptoms greater than 10 days 06/23/2013   Arthritis    Breast cancer ([place])    mastectomy 3   neg ln    HOH (hard of hearing)    HTN (hypertension)    Wears glasses    Wears hearing aid    both ears    Past Surgical History:  Procedure Laterality Date   APPENDECTOMY  2006   BREAST BIOPSY Left 2012   COLONOSCOPY     HERNIA REPAIR     INSERTION OF MESH N/A 09/05/2013   Procedure: INSERTION OF MESH;  Surgeon: Joyice Faster. Cornett, MD;  Location: [place];  Service: General;  Laterality: N/A;   MASTECTOMY Left 2013   TONSILLECTOMY AND ADENOIDECTOMY     Childhood   UMBILICAL HERNIA REPAIR N/A 09/05/2013   Procedure: HERNIA REPAIR UMBILICAL ADULT WITH MESH;  Surgeon: Joyice Faster. Cornett, MD;  Location: [place];  Service: General;  Laterality: N/A;    Current Medications: Current Meds  Medication Sig   apixaban (ELIQUIS) 5 MG TABS tablet Take 1 tablet (5 mg total) by mouth 2 (two) times daily.   atenolol (TENORMIN) 50 MG tablet TAKE 1 TABLET (50 MG TOTAL) BY MOUTH DAILY. TAKE 1 AND 1/2 TABLETS EVERY DAY   Cholecalciferol (VITAMIN D-3) 125 MCG (5000 UT) TABS Take 1  tablet by mouth daily.    Cyanocobalamin (VITAMIN B12) 3000 MCG SUBL Place 1 tablet under the tongue daily.   lisinopril (ZESTRIL) 40 MG tablet Take 1 tablet (40 mg total) by mouth daily. May decrease to 20 mg per day as indicated (Patient taking differently: Take 20 mg by mouth daily. May decrease to 20 mg per day as indicated)   meclizine (ANTIVERT) 25 MG tablet 1/2 to 1  po up to every 8 hours as needed for vertigo  Fall precautions   [DISCONTINUED] gabapentin (NEURONTIN) 100 MG capsule Take 1 capsule (100 mg total) by mouth at bedtime. Increase to 200 mg and then 300 mg  As  Indicated   [DISCONTINUED] pantoprazole (PROTONIX) 40 MG tablet Take 1 tablet (40 mg total) by mouth daily before breakfast.     Allergies:   Morphine and related   Social History   Socioeconomic History   Marital status: Widowed    Spouse name: Not on file   Number of children: 1   Years of education: 12   Highest education level: High school graduate  Occupational History    Comment: retired  Tobacco Use   Smoking status: Never Smoker   Smokeless tobacco: Never Used  Scientific laboratory technician Use: Never used  Substance  and Sexual Activity   Alcohol use: Yes    Alcohol/week: 0.0 standard drinks    Comment: 1 time a month. has a sip ever now and then    Drug use: No   Sexual activity: Not on file  Other Topics Concern   Not on file  Social History Narrative   2 people living in the home   Dog Small yorkie and [place]   From [place] moved from [place]  Adopted daughter nearby.      Neg ets  Wine with meals  No tob rd.   G0P0   Married husband [place] background  Herself [place] descent.   Husband passed CHF and prostate cancer 2017   Now living with daughter         Social Determinants of Health   Financial Resource Strain: Medium Risk   Difficulty of Paying Living Expenses: Somewhat hard  Food Insecurity: No Food Insecurity   Worried About Charity fundraiser in the Last Year: Never  true   Ran Out of Food in the Last Year: Never true  Transportation Needs: No Transportation Needs   Lack of Transportation (Medical): No   Lack of Transportation (Non-Medical): No  Physical Activity: Inactive   Days of Exercise per Week: 0 days   Minutes of Exercise per Session: 0 min  Stress:    Feeling of Stress :   Social Connections: Unknown   Frequency of Communication with Friends and Family: More than three times a week   Frequency of Social Gatherings with Friends and Family: Not on file   Attends Religious Services: Not on file   Active Member of Clubs or Organizations: Not on file   Attends Archivist Meetings: Not on file   Marital Status: Widowed     Family History: The patient's family history includes Colon cancer in her father; Heart failure in her mother; Hypertension in her mother; Lung disease in her sister; Stroke in her mother. There is no history of Esophageal cancer, Inflammatory bowel disease, Liver disease, Pancreatic cancer, or Stomach cancer.  ROS:   Please see the history of present illness.    All other systems reviewed and are negative.  EKGs/Labs/Other Studies Reviewed:    The following studies were reviewed today:  EKG:  NSR, LAFB  Recent Labs: 07/10/2019: ALT 25; BUN 26; Creatinine, Ser 1.10; Hemoglobin 14.8; Platelets 284.0; Potassium 4.8; Sodium 142; TSH 2.27  Recent Lipid Panel    Component Value Date/Time   CHOL 193 07/10/2019 1537   TRIG (H) 07/10/2019 1537    428.0 Triglyceride is over 400; calculations on Lipids are invalid.   HDL 28.50 (L) 07/10/2019 1537   CHOLHDL 7 07/10/2019 1537   VLDL 53.8 (H) 10/31/2014 1105   LDLDIRECT 103.0 07/10/2019 1537    Physical Exam:    VS:  BP (!) 130/100 (BP Location: Right Arm, Patient Position: Sitting, Cuff Size: Large)    Pulse 67    Ht 5\' 5"  (1.651 m)    Wt 166 lb (75.3 kg)    SpO2 97%    BMI 27.62 kg/m     Wt Readings from Last 5 Encounters:  09/06/19 166 lb (75.3  kg)  07/10/19 166 lb (75.3 kg)  02/27/19 172 lb (78 kg)  01/12/19 168 lb (76.2 kg)  11/08/18 169 lb (76.7 kg)     Constitutional: No acute distress Eyes: sclera non-icteric, normal conjunctiva and lids ENMT: normal dentition, moist mucous membranes Cardiovascular: regular rhythm, normal rate, no murmurs.  S1 and S2 normal. Radial pulses normal bilaterally. No jugular venous distention.  Respiratory: clear to auscultation bilaterally GI : normal bowel sounds, soft and nontender. No distention.   MSK: extremities warm, well perfused. No edema.  NEURO: grossly nonfocal exam, moves all extremities. PSYCH: alert and oriented x 3, normal mood and affect.   ASSESSMENT:    1. Essential hypertension   2. Paroxysmal atrial fibrillation (HCC)   3. History of pulmonary embolism    PLAN:    Essential hypertension - Plan: EKG 12-Lead -She feels well overall.  Blood pressure, diastolic, is mildly elevated.  We will reevaluate at next office visit or with PCP.  If still elevated can titrate therapy.  Currently on atenolol and lisinopril.  Given her age I would recommend transition off of atenolol given renal clearance and potential for accumulation and increased affect.  Paroxysmal atrial fibrillation (HCC) - Plan: EKG 12-Lead -No palpitations, no recent recurrent.  Continues on atenolol as noted above.  Eliquis for PE, however she is also CHA2DS2-VASc score of 4, would continue.  History of pulmonary embolism  -Seemingly unprovoked PE, indefinite anticoagulation indicated.  She is on apixaban 5 mg twice daily.    Total time of encounter: 30 minutes total time of encounter, including 20 minutes spent in face-to-face patient care on the date of this encounter. This time includes coordination of care and counseling regarding above mentioned problem list. Remainder of non-face-to-face time involved reviewing chart documents/testing relevant to the patient encounter and documentation in the medical  record. I have independently reviewed documentation from referring provider.   Cherlynn Kaiser, MD [place]   [facility]    Medication Adjustments/Labs and Tests Ordered: Current medicines are reviewed at length with the patient today.  Concerns regarding medicines are outlined above.  No orders of the defined types were placed in this encounter.  No orders of the defined types were placed in this encounter.   Patient Instructions  Medication Instructions:  NO CHANGES *If you need a refill on your cardiac medications before your next appointment, please call your pharmacy*   Lab Work: NOT NEEDED If you have labs (blood work) drawn today and your tests are completely normal, you will receive your results only by:  [place] (if you have MyChart) OR  A paper copy in the mail If you have any lab test that is abnormal or we need to change your treatment, we will call you to review the results.   Testing/Procedures: NOT NEEDED   Follow-Up: At [facility], you and your health needs are our priority.  As part of our continuing mission to provide you with exceptional heart care, we have created designated Provider Care Teams.  These Care Teams include your primary Cardiologist (physician) and Advanced Practice Providers (APPs -  Physician Assistants and Nurse Practitioners) who all work together to provide you with the care you need, when you need it.     Your next appointment:   6 month(s)  JAN 2022 The format for your next appointment:   In Person  Provider:   Cherlynn Kaiser, MD

## 2019-09-06 NOTE — Patient Instructions (Addendum)
Medication Instructions:  NO CHANGES *If you need a refill on your cardiac medications before your next appointment, please call your pharmacy*   Lab Work: NOT NEEDED If you have labs (blood work) drawn today and your tests are completely normal, you will receive your results only by: [name] MyChart Message (if you have MyChart) OR . A paper copy in the mail If you have any lab test that is abnormal or we need to change your treatment, we will call you to review the results.   Testing/Procedures: NOT NEEDED   Follow-Up: At [facility], you and your health needs are our priority.  As part of our continuing mission to provide you with exceptional heart care, we have created designated Provider Care Teams.  These Care Teams include your primary Cardiologist (physician) and Advanced Practice Providers (APPs -  Physician Assistants and Nurse Practitioners) who all work together to provide you with the care you need, when you need it.     Your next appointment:   6 month(s)  JAN 2022 The format for your next appointment:   In Person  Provider:   Cherlynn Kaiser, MD

## 2019-10-06 ENCOUNTER — Telehealth: Payer: Self-pay | Admitting: Internal Medicine

## 2019-10-06 ENCOUNTER — Other Ambulatory Visit: Payer: Self-pay

## 2019-10-06 MED ORDER — ATENOLOL 50 MG PO TABS: ORAL_TABLET | ORAL | 1 refills | Status: DC

## 2019-10-06 NOTE — Telephone Encounter (Signed)
Pt daugher calling to request Rx refill for atenolol sent to [phone]  [address]

## 2019-10-06 NOTE — Telephone Encounter (Signed)
Medication has been sent to the pharmacy requested. °

## 2019-11-16 ENCOUNTER — Encounter (INDEPENDENT_AMBULATORY_CARE_PROVIDER_SITE_OTHER): Payer: Self-pay | Admitting: Otolaryngology

## 2019-11-16 ENCOUNTER — Other Ambulatory Visit: Payer: Self-pay

## 2019-11-16 ENCOUNTER — Ambulatory Visit (INDEPENDENT_AMBULATORY_CARE_PROVIDER_SITE_OTHER): Payer: Medicare Other | Admitting: Otolaryngology

## 2019-11-16 VITALS — Temp 97.2°F

## 2019-11-16 DIAGNOSIS — H6123 Impacted cerumen, bilateral: Secondary | ICD-10-CM

## 2019-11-16 NOTE — Progress Notes (Signed)
HPI: Julie Boone is a 86 y.o. female who presents for evaluation of cerumen buildup left side worse than right.  She was last cleaned on 07/03/2019.[name]  Past Medical History:  Diagnosis Date  . Acute sinusitis with symptoms greater than 10 days 06/23/2013  . Arthritis   . Breast cancer ([place])    mastectomy 3   neg ln   . HOH (hard of hearing)   . HTN (hypertension)   . Wears glasses   . Wears hearing aid    both ears   Past Surgical History:  Procedure Laterality Date  . APPENDECTOMY  2006  . BREAST BIOPSY Left 2012  . COLONOSCOPY    . HERNIA REPAIR    . INSERTION OF MESH N/A 09/05/2013   Procedure: INSERTION OF MESH;  Surgeon: Joyice Faster. Cornett, MD;  Location: [place];  Service: General;  Laterality: N/A;  . MASTECTOMY Left 2013  . TONSILLECTOMY AND ADENOIDECTOMY     Childhood  . UMBILICAL HERNIA REPAIR N/A 09/05/2013   Procedure: HERNIA REPAIR UMBILICAL ADULT WITH MESH;  Surgeon: Joyice Faster. Cornett, MD;  Location: [place];  Service: General;  Laterality: N/A;   Social History   Socioeconomic History  . Marital status: Widowed    Spouse name: Not on file  . Number of children: 1  . Years of education: 62  . Highest education level: High school graduate  Occupational History    Comment: retired  Tobacco Use  . Smoking status: Never Smoker  . Smokeless tobacco: Never Used  Vaping Use  . Vaping Use: Never used  Substance and Sexual Activity  . Alcohol use: Yes    Alcohol/week: 0.0 standard drinks    Comment: 1 time a month. has a sip ever now and then   . Drug use: No  . Sexual activity: Not on file  Other Topics Concern  . Not on file  Social History Narrative   2 people living in the home   Dog Small yorkie and Leslie   From [place] moved from [place]  Adopted daughter nearby.      Neg ets  Wine with meals  No tob rd.   G0P0   Married husband [place] background  Herself [place] descent.   Husband passed CHF and prostate cancer  2017   Now living with daughter         Social Determinants of Health   Financial Resource Strain: Medium Risk  . Difficulty of Paying Living Expenses: Somewhat hard  Food Insecurity: No Food Insecurity  . Worried About Charity fundraiser in the Last Year: Never true  . Ran Out of Food in the Last Year: Never true  Transportation Needs: No Transportation Needs  . Lack of Transportation (Medical): No  . Lack of Transportation (Non-Medical): No  Physical Activity: Inactive  . Days of Exercise per Week: 0 days  . Minutes of Exercise per Session: 0 min  Stress:   . Feeling of Stress : Not on file  Social Connections: Unknown  . Frequency of Communication with Friends and Family: More than three times a week  . Frequency of Social Gatherings with Friends and Family: Not on file  . Attends Religious Services: Not on file  . Active Member of Clubs or Organizations: Not on file  . Attends Archivist Meetings: Not on file  . Marital Status: Widowed   Family History  Problem Relation Age of Onset  . Hypertension Mother   .  Stroke Mother   . Heart failure Mother   . Lung disease Sister        smoker  . Colon cancer Father        dx in his late 87's  . Esophageal cancer Neg Hx   . Inflammatory bowel disease Neg Hx   . Liver disease Neg Hx   . Pancreatic cancer Neg Hx   . Stomach cancer Neg Hx    Allergies  Allergen Reactions  . Morphine And Related Nausea And Vomiting   Prior to Admission medications   Medication Sig Start Date End Date Taking? Authorizing Provider  apixaban (ELIQUIS) 5 MG TABS tablet Take 1 tablet (5 mg total) by mouth 2 (two) times daily. 03/09/19  Yes Elouise Munroe, MD  atenolol (TENORMIN) 50 MG tablet TAKE 1 TABLET (50 MG TOTAL) BY MOUTH DAILY. TAKE 1 AND 1/2 TABLETS EVERY DAY 10/06/19  Yes Panosh, Standley Brooking, MD  Cholecalciferol (VITAMIN D-3) 125 MCG (5000 UT) TABS Take 1 tablet by mouth daily.    Yes [provider]  Cyanocobalamin  (VITAMIN B12) 3000 MCG SUBL Place 1 tablet under the tongue daily.   Yes [provider]  lisinopril (ZESTRIL) 40 MG tablet Take 1 tablet (40 mg total) by mouth daily. May decrease to 20 mg per day as indicated Patient taking differently: Take 20 mg by mouth daily. May decrease to 20 mg per day as indicated 07/10/19  Yes Panosh, Standley Brooking, MD  meclizine (ANTIVERT) 25 MG tablet 1/2 to 1  po up to every 8 hours as needed for vertigo  Fall precautions 06/13/19  Yes Panosh, Standley Brooking, MD     Positive ROS: Otherwise negative  All other systems have been reviewed and were otherwise negative with the exception of those mentioned in the HPI and as above.  Physical Exam: Constitutional: Alert, well-appearing, no acute distress Ears: External ears without lesions or tenderness. Ear canals mild amount of wax buildup in both ear canals the left ear canal smaller than the right ear canal.  This was cleaned in the office using forceps and curettes.  TMs were otherwise clear.. Nasal: External nose without lesions. Clear nasal passages Oral: Oropharynx clear. Neck: No palpable adenopathy or masses Respiratory: Breathing comfortably  Skin: No facial/neck lesions or rash noted.  Cerumen impaction removal  Date/Time: 11/16/2019 5:48 PM Performed by: [name], MD Authorized by: [name], MD   Consent:    Consent obtained:  Verbal   Consent given by:  Patient   Risks discussed:  Pain and bleeding Procedure details:    Location:  L ear and R ear   Procedure type: curette and forceps   Post-procedure details:    Inspection:  TM intact and canal normal   Hearing quality:  Improved   Patient tolerance of procedure:  Tolerated well, no immediate complications Comments:     TMs are clear bilaterally    Assessment: Bilateral cerumen buildup  Plan: She will follow-up as needed  Radene Journey, MD

## 2020-01-22 ENCOUNTER — Ambulatory Visit (INDEPENDENT_AMBULATORY_CARE_PROVIDER_SITE_OTHER): Payer: Medicare Other | Admitting: Internal Medicine

## 2020-01-22 VITALS — BP 166/76 | HR 73 | Ht 65.0 in | Wt 169.6 lb

## 2020-01-22 DIAGNOSIS — I10 Essential (primary) hypertension: Secondary | ICD-10-CM | POA: Diagnosis not present

## 2020-01-22 DIAGNOSIS — I480 Paroxysmal atrial fibrillation: Secondary | ICD-10-CM

## 2020-01-22 DIAGNOSIS — Z86711 Personal history of pulmonary embolism: Secondary | ICD-10-CM

## 2020-01-22 MED ORDER — APIXABAN 5 MG PO TABS: 5.0000 mg | ORAL_TABLET | Freq: Two times a day (BID) | ORAL | 3 refills | Status: DC

## 2020-01-22 NOTE — Progress Notes (Signed)
Cardiology Office Note:    Date:  01/22/2020   ID:  Julie Boone, DOB [birth year], MRN [id number]  PCP:  Burnis Medin, MD  Cardiologist:  Elouise Munroe, MD  Electrophysiologist:  None   Referring MD: Burnis Medin, MD   Chief Complaint/Reason for Referral: PAF, HTN, PE  History of Present Illness:    Julie Boone is a 86 y.o. female with a history of PAF, HTN, and PE who presents for follow up.   It is her daughter Julie Boone birthday today, who joins her for the visit and provides some history.  She is overall well and has no significant concerns. She again notes right leg cramping for which she uses an herbal product (appears to contain magnesium). She has used this for years and we discussed use sparingly.   Follows with me for unprovoked PE and continued anticoagulation. She also has PAF with CHADS2VASC of 4 with indications for continued anticoagulation.   She remains in excellent spirits as always. She denies chest pain, chest pressure, dyspnea at rest or with exertion, palpitations, PND, orthopnea, or leg swelling. Denies cough, fever, chills. Denies nausea, vomiting. Denies syncope or presyncope. Denies dizziness or lightheadedness.  Past Medical History:  Diagnosis Date  . Acute sinusitis with symptoms greater than 10 days 06/23/2013  . Arthritis   . Breast cancer ([place])    mastectomy 3   neg ln   . HOH (hard of hearing)   . HTN (hypertension)   . Wears glasses   . Wears hearing aid    both ears    Past Surgical History:  Procedure Laterality Date  . APPENDECTOMY  2006  . BREAST BIOPSY Left 2012  . COLONOSCOPY    . HERNIA REPAIR    . INSERTION OF MESH N/A 09/05/2013   Procedure: INSERTION OF MESH;  Surgeon: Joyice Faster. Cornett, MD;  Location: [facility];  Service: General;  Laterality: N/A;  . MASTECTOMY Left 2013  . TONSILLECTOMY AND ADENOIDECTOMY     Childhood  . UMBILICAL HERNIA REPAIR N/A 09/05/2013   Procedure: HERNIA  REPAIR UMBILICAL ADULT WITH MESH;  Surgeon: Joyice Faster. Cornett, MD;  Location: [facility];  Service: General;  Laterality: N/A;    Current Medications: Current Meds  Medication Sig  . apixaban (ELIQUIS) 5 MG TABS tablet Take 1 tablet (5 mg total) by mouth 2 (two) times daily.  [name] atenolol (TENORMIN) 50 MG tablet TAKE 1 TABLET (50 MG TOTAL) BY MOUTH DAILY. TAKE 1 AND 1/2 TABLETS EVERY DAY  . Cholecalciferol (VITAMIN D-3) 125 MCG (5000 UT) TABS Take 1 tablet by mouth daily.   . Cyanocobalamin (VITAMIN B12) 3000 MCG SUBL Place 1 tablet under the tongue daily.  [name] lisinopril (ZESTRIL) 40 MG tablet Take 1 tablet (40 mg total) by mouth daily. May decrease to 20 mg per day as indicated (Patient taking differently: Take 20 mg by mouth daily. May decrease to 20 mg per day as indicated)  . meclizine (ANTIVERT) 25 MG tablet 1/2 to 1  po up to every 8 hours as needed for vertigo  Fall precautions  . Omega-3 Fatty Acids (FISH OIL) 500 MG CAPS Take 500 mg by mouth daily.         Allergies:   Morphine and related   Social History   Tobacco Use  . Smoking status: Never Smoker  . Smokeless tobacco: Never Used  Vaping Use  . Vaping Use: Never used  Substance Use Topics  .  Alcohol use: Yes    Alcohol/week: 0.0 standard drinks    Comment: 1 time a month. has a sip ever now and then   . Drug use: No     Family History: The patient's family history includes Colon cancer in her father; Heart failure in her mother; Hypertension in her mother; Lung disease in her sister; Stroke in her mother. There is no history of Esophageal cancer, Inflammatory bowel disease, Liver disease, Pancreatic cancer, or Stomach cancer.  ROS:   Please see the history of present illness.    All other systems reviewed and are negative.  EKGs/Labs/Other Studies Reviewed:    The following studies were reviewed today:  EKG:  NSR, LAFB  Recent Labs: 07/10/2019: ALT 25; BUN 26; Creatinine, Ser 1.10; Hemoglobin  14.8; Platelets 284.0; Potassium 4.8; Sodium 142; TSH 2.27  Recent Lipid Panel    Component Value Date/Time   CHOL 193 07/10/2019 1537   TRIG (H) 07/10/2019 1537    428.0 Triglyceride is over 400; calculations on Lipids are invalid.   HDL 28.50 (L) 07/10/2019 1537   CHOLHDL 7 07/10/2019 1537   VLDL 53.8 (H) 10/31/2014 1105   LDLDIRECT 103.0 07/10/2019 1537    Physical Exam:    VS:  BP (!) 166/76 (BP Location: Right Arm, Patient Position: Sitting)   Pulse 73   Ht 5\' 5"  (1.651 m)   Wt 169 lb 9.6 oz (76.9 kg)   SpO2 97%   BMI 28.22 kg/m     Wt Readings from Last 5 Encounters:  01/22/20 169 lb 9.6 oz (76.9 kg)  09/06/19 166 lb (75.3 kg)  07/10/19 166 lb (75.3 kg)  02/27/19 172 lb (78 kg)  01/12/19 168 lb (76.2 kg)    Constitutional: No acute distress Eyes: sclera non-icteric, normal conjunctiva and lids ENMT: normal dentition, moist mucous membranes Cardiovascular: regular rhythm, normal rate, no murmurs. S1 and S2 normal. Radial pulses normal bilaterally. No jugular venous distention.  Respiratory: clear to auscultation bilaterally GI : normal bowel sounds, soft and nontender. No distention.   MSK: extremities warm, well perfused. No edema.  NEURO: grossly nonfocal exam, moves all extremities. PSYCH: alert and oriented x 3, normal mood and affect.   ASSESSMENT:    1. Paroxysmal atrial fibrillation (HCC)   2. History of pulmonary embolism   3. Essential hypertension    PLAN:    Paroxysmal atrial fibrillation ([place]) - Plan: EKG 12-Lead - no palpitations, no recurrence by ECG. CHADS2VASC 4, continue Eliquis 5 mg BID (meets weight and renal function criteria to continue full strength AC).  - patient takes atenolol for rate control and HTN. We have discussed in the past the decreased renal clearance of atenolol with age, and would prefer to switch her to a different agent. She feels things are stable and would like defer this. Will review again at follow up.   History of  pulmonary embolism - seems unprovoked. She has an additional indication for indefinite anticoagulation for PAF, can continue AC at this time. No bleeding.   Essential hypertension - BP elevated today but she feels this is situational, and generally is within normal range at home. Continue lisinopril and atenolol, will review at follow up.   Total time of encounter: 30 minutes total time of encounter, including 20 minutes spent in face-to-face patient care on the date of this encounter. This time includes coordination of care and counseling regarding above mentioned problem list. Remainder of non-face-to-face time involved reviewing chart documents/testing relevant to the patient  encounter and documentation in the medical record. I have independently reviewed documentation from referring provider.   Cherlynn Kaiser, MD [place]  [facility]    Medication Adjustments/Labs and Tests Ordered: Current medicines are reviewed at length with the patient today.  Concerns regarding medicines are outlined above.   Orders Placed This Encounter  Procedures  . EKG 12-Lead    Meds ordered this encounter  Medications  . apixaban (ELIQUIS) 5 MG TABS tablet    Sig: Take 1 tablet (5 mg total) by mouth 2 (two) times daily.    Dispense:  180 tablet    Refill:  3    Patient Instructions  Medication Instructions:  No Changes In Medications at this time.  *If you need a refill on your cardiac medications before your next appointment, please call your pharmacy*   Lab Work: None Ordered At This Time.  If you have labs (blood work) drawn today and your tests are completely normal, you will receive your results only by: [name] MyChart Message (if you have MyChart) OR . A paper copy in the mail If you have any lab test that is abnormal or we need to change your treatment, we will call you to review the results.  Follow-Up: At [facility], you and your health needs are our priority.  As part of our  continuing mission to provide you with exceptional heart care, we have created designated Provider Care Teams.  These Care Teams include your primary Cardiologist (physician) and Advanced Practice Providers (APPs -  Physician Assistants and Nurse Practitioners) who all work together to provide you with the care you need, when you need it.  Your next appointment:   1 year(s)  The format for your next appointment:   In Person  Provider:   Cherlynn Kaiser, MD

## 2020-01-22 NOTE — Patient Instructions (Signed)
Medication Instructions:  No Changes In Medications at this time.  *If you need a refill on your cardiac medications before your next appointment, please call your pharmacy*   Lab Work: None Ordered At This Time.  If you have labs (blood work) drawn today and your tests are completely normal, you will receive your results only by:  [place] (if you have MyChart) OR  A paper copy in the mail If you have any lab test that is abnormal or we need to change your treatment, we will call you to review the results.  Follow-Up: At [facility], you and your health needs are our priority.  As part of our continuing mission to provide you with exceptional heart care, we have created designated Provider Care Teams.  These Care Teams include your primary Cardiologist (physician) and Advanced Practice Providers (APPs -  Physician Assistants and Nurse Practitioners) who all work together to provide you with the care you need, when you need it.  Your next appointment:   1 year(s)  The format for your next appointment:   In Person  Provider:   Cherlynn Kaiser, MD

## 2020-01-31 ENCOUNTER — Telehealth: Payer: Self-pay | Admitting: Internal Medicine

## 2020-01-31 NOTE — Telephone Encounter (Signed)
Pt came in and dropped off a handicap placard form to be filled out by the provider.  Upon completion pts daughter would like to have a call [phone] to pick it up form.  Form placed in providers folder for completion.

## 2020-02-02 NOTE — Telephone Encounter (Signed)
Form  Placed on desk

## 2020-02-02 NOTE — Telephone Encounter (Signed)
Form completed,no charge per Dr. Regis Bill called patient daughter Pamala Hurry will stop by office for pick up

## 2020-02-19 ENCOUNTER — Telehealth: Payer: Self-pay | Admitting: Internal Medicine

## 2020-02-19 NOTE — Telephone Encounter (Signed)
Left message for patient to call back and schedule Medicare Annual Wellness Visit (AWV) either virtually or in office.   Last AWV 02/27/19 please schedule at anytime with [facility] Nurse Health Advisor 1 or 2   This should be a 45 minute visit.

## 2020-04-22 ENCOUNTER — Telehealth: Payer: Self-pay | Admitting: Internal Medicine

## 2020-04-22 NOTE — Telephone Encounter (Signed)
Patient with diagnosis of afib and unprovoked PE on Eliquis for anticoagulation.    Procedure: 5 teeth extraction Date of procedure: TBD  CHA2DS2-VASc Score = 4  This indicates a 4.8% annual risk of stroke. The patient's score is based upon: CHF History: No HTN History: Yes Diabetes History: No Stroke History: No Vascular Disease History: No Age Score: 2 Gender Score: 1      CrCl 32 ml/min  Patient does not require pre-op antibiotics for dental procedure.  Per office protocol, patient can hold Eliquis for 1 day prior to procedure.

## 2020-04-22 NOTE — Telephone Encounter (Signed)
   Primary Cardiologist: Elouise Munroe, MD  Chart reviewed as part of pre-operative protocol coverage. Given past medical history and time since last visit, based on ACC/AHA guidelines, Julie Boone would be at acceptable risk for the planned procedure without further cardiovascular testing.   Per pharmacy review she may hold Eliquis 1 day prior to the planned procedure. She does not require antibiotics prior to dental procedures. Left detailed VM with these instructions per DPR. Will send MyChart message.  I will route this recommendation to the requesting party via Epic fax function and remove from pre-op pool.  Please call with questions.  Loel Dubonnet, NP 04/22/2020, 4:11 PM

## 2020-04-22 NOTE — Telephone Encounter (Signed)
   [facility] Pre-operative Risk Assessment      Request for surgical clearance:  1. What type of surgery is being performed? Dental extractions - 5 teeth   2. When is this surgery scheduled? TBD   3. What type of clearance is required (medical clearance vs. Pharmacy clearance to hold med vs. Both)? Both   4. Are there any medications that need to be held prior to surgery and how long? Eliquis   5. Practice name and name of physician performing surgery? [facility]    6. What is the office phone number? [phone]   7.   What is the office fax number? [phone]  8.   Anesthesia type (None, local, MAC, general) ? Local anesthetic with epinephrine    Julie Boone M 04/22/2020, 11:01 AM  _________________________________________________________________   (provider comments below)

## 2020-05-20 ENCOUNTER — Other Ambulatory Visit: Payer: Self-pay

## 2020-05-20 NOTE — Progress Notes (Signed)
Chief Complaint  Patient presents with  . Annual Exam    HPI: Julie Boone 87 y.o. comes in today for Preventive Medicare exam/ wellness visit .Since last visit.  Here with her daughter Julie Boone states she is doing pretty well has not had a major change in health. Toes burning off and on and cold and hot especially at night but has declined to take medication in the past Does have leg cramps and takes an over-the-counter homeopathic preparation [place] with help. Stays on anticoagulation for her paroxysmal A. fib and history of PE without cause.  No bleeding. She has hearing aids which have really helped her and recently had her ears cleaned out. Blood pressure stable on lisinopril and atenolol. 's recently had teeth extraction no complications. Takes B12 and vitamin D No falling rise to stay active doing laps with her walker used to do more mobility in past wishes she did more.  Health Maintenance  Topic Date Due  . TETANUS/TDAP  Never done  . MAMMOGRAM  02/15/2018  . INFLUENZA VACCINE  07/07/2020 (Originally 10/01/2019)  . COVID-19 Vaccine (4 - Booster for Pfizer series) 08/21/2020  . DEXA SCAN  Completed  . HPV VACCINES  Aged Out   Health Maintenance Review LIFESTYLE:  Exercise:   Using walker to get exrecise  Around house . deck Tobacco/ETS:n Alcohol: nn Sugar beverages:n Sleep:  Not as much  Neuropathy toes waker her up.  Does not nap in the day is active. Drug use: no HH:  2  Dog has diabetes daughter gives it twice daily insulin injection babay hats  For hospital and other   Knee issures    Hearing: Has hearing aids  Vision:  No limitations at present . Last eye check UTD  Safety:  Has smoke detector and wears seat belts.  . No excess sun exposure. Sees dentist regularly.  Falls: ne.  Memory: Felt to be good  , no concern from her or her family.  Depression: No anhedonia unusual crying or depressive symptoms  Nutrition: Eats well balanced  diet; adequate calcium and vitamin D. No swallowing chewing problems.  Injury: no major injuries in the last six months.  Other healthcare providers:  Reviewed today .   Preventive parameters: up-to-date  Reviewed   ADLS:   There are no problems or need for assistance  feeding, obtaining food, dressing, toileting and bathing, managing money using phone.  Daughter prepares the food spends times making baby hats for the hospital writing notes to the shot ins from church and is always doing something.    ROS:  GEN/ HEENT: No fever, significant weight changes sweats headaches vision problems, CV/ PULM; No chest pain shortness of breath cough, syncope,edema  change in exercise tolerance. GI /GU: No adominal pain, vomiting, change in bowel habits. No blood in the stool. No significant GU symptoms. SKIN/HEME: ,no acute skin rashes suspicious lesions or bleeding. No lymphadenopathy, nodules, masses.  NEURO/ PSYCH: s. No depression anxiety. IMM/ Allergy: No unusual infections.  Allergy .   REST of 12 system review negative except as per HPI   Past Medical History:  Diagnosis Date  . Acute sinusitis with symptoms greater than 10 days 06/23/2013  . Arthritis   . Breast cancer ([place])    mastectomy 3   neg ln   . HOH (hard of hearing)   . HTN (hypertension)   . Wears glasses   . Wears hearing aid    both ears    Family  History  Problem Relation Age of Onset  . Hypertension Mother   . Stroke Mother   . Heart failure Mother   . Lung disease Sister        smoker  . Colon cancer Father        dx in his late 57's  . Esophageal cancer Neg Hx   . Inflammatory bowel disease Neg Hx   . Liver disease Neg Hx   . Pancreatic cancer Neg Hx   . Stomach cancer Neg Hx     Social History   Socioeconomic History  . Marital status: Widowed    Spouse name: Not on file  . Number of children: 1  . Years of education: 33  . Highest education level: High school graduate  Occupational History     Comment: retired  Tobacco Use  . Smoking status: Never Smoker  . Smokeless tobacco: Never Used  Vaping Use  . Vaping Use: Never used  Substance and Sexual Activity  . Alcohol use: Yes    Alcohol/week: 0.0 standard drinks    Comment: 1 time a month. has a sip ever now and then   . Drug use: No  . Sexual activity: Not on file  Other Topics Concern  . Not on file  Social History Narrative   2 people living in the home   Dog Small [place] and [place]   From [place] moved from [place]  Adopted daughter nearby.      Neg ets  Wine with meals  No tob rd.   G0P0   Married husband [place] background  [place] descent.   Husband passed CHF and prostate cancer 2017   Now living with daughter         Social Determinants of Health   Financial Resource Strain: Medium Risk  . Difficulty of Paying Living Expenses: Somewhat hard  Food Insecurity: Not on file  Transportation Needs: No Transportation Needs  . Lack of Transportation (Medical): No  . Lack of Transportation (Non-Medical): No  Physical Activity: Not on file  Stress: Not on file  Social Connections: Not on file    Outpatient Encounter Medications as of 05/21/2020  Medication Sig  . apixaban (ELIQUIS) 5 MG TABS tablet Take 1 tablet (5 mg total) by mouth 2 (two) times daily.  [name] atenolol (TENORMIN) 50 MG tablet TAKE 1 TABLET (50 MG TOTAL) BY MOUTH DAILY. TAKE 1 AND 1/2 TABLETS EVERY DAY  . Cholecalciferol (VITAMIN D-3) 125 MCG (5000 UT) TABS Take 1 tablet by mouth daily.   . Cyanocobalamin (VITAMIN B12) 3000 MCG SUBL Place 1 tablet under the tongue daily.  [name] lisinopril (ZESTRIL) 40 MG tablet Take 1 tablet (40 mg total) by mouth daily. May decrease to 20 mg per day as indicated (Patient taking differently: Take 20 mg by mouth in the morning and at bedtime. May decrease to 20 mg per day as indicated twice daily)  . meclizine (ANTIVERT) 25 MG tablet 1/2 to 1  po up to every 8 hours as needed for vertigo  Fall precautions  . Omega-3  Fatty Acids (FISH OIL) 500 MG CAPS Take 500 mg by mouth 3 (three) times a week.  [name] UNABLE TO FIND Med Name: [place] leg cramps ho meo pathix med  Takes as needed for leg cramps says it works well   No facility-administered encounter medications on file as of 05/21/2020.    EXAM:  BP 130/70 (BP Location: Left Arm, Patient Position: Sitting, Cuff Size: Normal)   Pulse  60   Temp 97.7 F (36.5 C) (Oral)   Ht _0  (1.6 m)   Wt 164 lb (74.4 kg)   SpO2 95%   BMI 29.05 kg/m   Body mass index is 29.05 kg/m.  Physical Exam: Vital signs reviewed BSW:HQPR is a well-developed well-nourished alert cooperative   who appears stated age in no acute distress.  HEENT: normocephalic atraumatic , Eyes: PERRL EOM's full, conjunctiva clear, Nares: paten,t no deformity discharge or tenderness., Ears: no deformity EAC's clear TMs with normal landmarks hearing aids. Mouth: Masked NECK: supple without masses, thyromegaly or bruits. CHEST/PULM:  Clear to auscultation and percussion breath sounds equal no wheeze , rales or rhonchi. No chest wall deformities or tenderness.  Mild kyphosis Left breast absent right no nodules CV: PMI is nondisplaced, regular rhythm S1 S2 no gallops, murmurs, rubs. Peripheral pulses are f present no JVD .  ABDOMEN: Bowel sounds normal nontender  No guard or rebound, no hepato splenomegal no CVA tenderness.   Extremtities:  No clubbing cyanosis or edema, no acute joint swelling or redness no focal atrophy has compression stocks that zip up toes have some discoloration but are warm to touch no callus circulation seen NEURO:  Oriented x3, cranial nerves 3-12 appear to be intact, no obvious focal weakness,gait within normal limits ambulatory independent SKIN: No acute rashes normal turgor, color, no bruising or petechiae.  Age changes PSYCH: Oriented, good eye contact, no obvious depression anxiety, cognition and judgment appear normal. LN: no cervical axillary iadenopathy No noted  deficits in memory, attention, and speech.   Lab Results  Component Value Date   WBC 7.8 05/21/2020   HGB 15.1 (H) 05/21/2020   HCT 44.7 05/21/2020   PLT 236.0 05/21/2020   GLUCOSE 122 (H) 05/21/2020   CHOL 187 05/21/2020   TRIG 176.0 (H) 05/21/2020   HDL 35.30 (L) 05/21/2020   LDLDIRECT 103.0 07/10/2019   LDLCALC 117 (H) 05/21/2020   ALT 21 05/21/2020   AST 25 05/21/2020   NA 140 05/21/2020   K 4.6 05/21/2020   CL 107 05/21/2020   CREATININE 0.88 05/21/2020   BUN 25 (H) 05/21/2020   CO2 24 05/21/2020   TSH 2.71 05/21/2020   INR 0.93 04/01/2018   HGBA1C 6.2 05/21/2020   Lab Results  Component Value Date   VITAMINB12 994 (H) 07/10/2019  She is fasting today  ASSESSMENT AND PLAN:  Discussed the following assessment and plan:  Visit for preventive health examination  Medication management - Plan: Basic metabolic panel, CBC with Differential/Platelet, Hemoglobin A1c, Hepatic function panel, Lipid panel, TSH, T4, free, T4, free, TSH, Lipid panel, Hepatic function panel, Hemoglobin A1c, CBC with Differential/Platelet, Basic metabolic panel  Essential hypertension - Plan: Basic metabolic panel, CBC with Differential/Platelet, Hemoglobin A1c, Hepatic function panel, Lipid panel, TSH, T4, free, T4, free, TSH, Lipid panel, Hepatic function panel, Hemoglobin A1c, CBC with Differential/Platelet, Basic metabolic panel  History of pulmonary embolus (PE) - Plan: Basic metabolic panel, CBC with Differential/Platelet, Hemoglobin A1c, Hepatic function panel, Lipid panel, TSH, T4, free, T4, free, TSH, Lipid panel, Hepatic function panel, Hemoglobin A1c, CBC with Differential/Platelet, Basic metabolic panel  Elevated blood sugar - Plan: Basic metabolic panel, CBC with Differential/Platelet, Hemoglobin A1c, Hepatic function panel, Lipid panel, TSH, T4, free, T4, free, TSH, Lipid panel, Hepatic function panel, Hemoglobin A1c, CBC with Differential/Platelet, Basic metabolic panel  History of  breast cancer - Plan: Basic metabolic panel, CBC with Differential/Platelet, Hemoglobin A1c, Hepatic function panel, Lipid panel, TSH, T4, free, T4,  free, TSH, Lipid panel, Hepatic function panel, Hemoglobin A1c, CBC with Differential/Platelet, Basic metabolic panel  Hyperlipidemia, unspecified hyperlipidemia type - Plan: Basic metabolic panel, CBC with Differential/Platelet, Hemoglobin A1c, Hepatic function panel, Lipid panel, TSH, T4, free, T4, free, TSH, Lipid panel, Hepatic function panel, Hemoglobin A1c, CBC with Differential/Platelet, Basic metabolic panel  Anticoagulant long-term use - Plan: Basic metabolic panel, CBC with Differential/Platelet, Hemoglobin A1c, Hepatic function panel, Lipid panel, TSH, T4, free, T4, free, TSH, Lipid panel, Hepatic function panel, Hemoglobin A1c, CBC with Differential/Platelet, Basic metabolic panel  Neuropathy presumed  burning  feet   Advanced age Discussed above and the various conditions that she has she is actually doing quite well is mentally active and fit for age agree with assistance in her walking to avoid falls which may be her biggest risk at this time. She declines medicine for most things such as what sounds like a neuropathy will remain on anticoagulation and antihypertensive medications at this time. He is taking a homeopathic medicine for leg cramps as needed and swears by it that it is quite helpful I suppose it does not have side effects caution is needed. Plan lab work today and follow-up 6 to 12 months depending. Patient Care Team: Emilie Carp, Standley Brooking, MD as PCP - General (Internal Medicine) Elouise Munroe, MD as PCP - Cardiology (Cardiology) Erroll Luna, MD as Consulting Physician (General Surgery) Inocencio Homes, DPM as Consulting Physician (Podiatry) Earnie Larsson, [facility] as Pharmacist (Pharmacist)  Patient Instructions   Will notify you  of labs when available.   Continue   Activity as tolerated   Fall prevention.   Glad  you are enjoying  Life.    Understanding Your Risk for Falls Each year, millions of people have serious injuries from falls. It is important to understand your risk for falling. Talk with your health care provider about your risk and what you can do to lower it. There are actions you can take at home to lower your risk and prevent falls. If you do have a serious fall, make sure to tell your health care provider. Falling once raises your risk of falling again. How can falls affect me? Serious injuries from falls are common. These include:  Broken bones, such as hip fractures.  Head injuries, such as traumatic brain injuries (TBI) or concussion. A fear of falling can cause you to avoid activities and stay at home. This can make your muscles weaker and actually raise your risk for a fall. What can increase my risk? There are a number of risk factors that increase your risk for falling. The more risk factors you have, the higher your risk of falling. Serious injuries from a fall happen most often to people older than age 69. Children and young adults ages 77-29 are also at higher risk. Common risk factors include:  Weakness in the lower body.  Lack (deficiency) of vitamin D.  Being generally weak or confused due to long-term (chronic) illness.  Dizziness or balance problems.  Poor vision.  Medicines that cause dizziness or drowsiness. These can include medicines for your blood pressure, heart, anxiety, insomnia, or edema, as well as pain medicines and muscle relaxants. Other risk factors include:  Drinking alcohol.  Having had a fall in the past.  Having depression.  Having foot pain or wearing improper footwear.  Working at a dangerous job.  Having any of the following in your home: ? Tripping hazards, such as floor clutter or loose rugs. ? Poor lighting. ? Pets.  Having dementia or memory loss. What actions can I take to lower my risk of falling? Physical  activity Maintain physical fitness. Do strength and balance exercises. Consider taking a regular class to build strength and balance. Yoga and tai chi are good options. Vision Have your eyes checked every year and your vision prescription updated as needed. Walking aids and footwear  Wear nonskid shoes. Do not wear high heels.  Do not walk around the house in socks or slippers.  Use a cane or walker as told by your health care provider. Home safety  Attach secure railings on both sides of your stairs.  Install grab bars for your tub, shower, and toilet. Use a bath mat in your tub or shower.  Use good lighting in all rooms. Keep a flashlight near your bed.  Make sure there is a clear path from your bed to the bathroom. Use night-lights.  Do not use throw rugs. Make sure all carpeting is taped or tacked down securely.  Remove all clutter from walkways and stairways, including extension cords.  Repair uneven or broken steps.  Avoid walking on icy or slippery surfaces. Walk on the grass instead of on icy or slick sidewalks. Use ice melt to get rid of ice on walkways.  Use a cordless phone.      Questions to ask your health care provider  Can you help me check my risk for a fall?  Do any of my medicines make me more likely to fall?  Should I take a vitamin D supplement?  What exercises can I do to improve my strength and balance?  Should I make an appointment to have my vision checked?  Do I need a bone density test to check for weak bones or osteoporosis?  Would it help to use a cane or a walker? Where to find more information  Centers for Disease Control and Prevention, STEADI: http://www.wolf.info/  Community-Based Fall Prevention Programs: http://www.wolf.info/  National Institute on Aging: http://kim-miller.com/ Contact a health care provider if:  You fall at home.  You are afraid of falling at home.  You feel weak, drowsy, or dizzy. Summary  Serious injuries from a fall happen  most often to people older than age 85. Children and young adults ages 87-29 are also at higher risk.  Talk with your health care provider about your risks for falling and how to lower those risks.  Taking certain precautions at home can lower your risk for falling.  If you fall, always tell your health care provider. This information is not intended to replace advice given to you by your health care provider. Make sure you discuss any questions you have with your health care provider. Document Revised: 09/20/2019 Document Reviewed: 09/20/2019 Elsevier Patient Education  2021 [place]. Kamerin Axford M.D.

## 2020-05-21 ENCOUNTER — Encounter: Payer: Self-pay | Admitting: Internal Medicine

## 2020-05-21 ENCOUNTER — Ambulatory Visit (INDEPENDENT_AMBULATORY_CARE_PROVIDER_SITE_OTHER): Payer: Medicare Other | Admitting: Internal Medicine

## 2020-05-21 VITALS — BP 130/70 | HR 60 | Temp 97.7°F | Ht 63.0 in | Wt 164.0 lb

## 2020-05-21 DIAGNOSIS — R54 Age-related physical debility: Secondary | ICD-10-CM

## 2020-05-21 DIAGNOSIS — Z7901 Long term (current) use of anticoagulants: Secondary | ICD-10-CM

## 2020-05-21 DIAGNOSIS — Z86711 Personal history of pulmonary embolism: Secondary | ICD-10-CM

## 2020-05-21 DIAGNOSIS — E785 Hyperlipidemia, unspecified: Secondary | ICD-10-CM

## 2020-05-21 DIAGNOSIS — G629 Polyneuropathy, unspecified: Secondary | ICD-10-CM

## 2020-05-21 DIAGNOSIS — Z0000 Encounter for general adult medical examination without abnormal findings: Secondary | ICD-10-CM | POA: Diagnosis not present

## 2020-05-21 DIAGNOSIS — Z79899 Other long term (current) drug therapy: Secondary | ICD-10-CM

## 2020-05-21 DIAGNOSIS — R739 Hyperglycemia, unspecified: Secondary | ICD-10-CM | POA: Diagnosis not present

## 2020-05-21 DIAGNOSIS — I10 Essential (primary) hypertension: Secondary | ICD-10-CM | POA: Diagnosis not present

## 2020-05-21 DIAGNOSIS — Z853 Personal history of malignant neoplasm of breast: Secondary | ICD-10-CM

## 2020-05-21 LAB — HEPATIC FUNCTION PANEL
ALT: 21 U/L (ref 0–35)
AST: 25 U/L (ref 0–37)
Albumin: 4.1 g/dL (ref 3.5–5.2)
Alkaline Phosphatase: 74 U/L (ref 39–117)
Bilirubin, Direct: 0.1 mg/dL (ref 0.0–0.3)
Total Bilirubin: 0.7 mg/dL (ref 0.2–1.2)
Total Protein: 6.3 g/dL (ref 6.0–8.3)

## 2020-05-21 LAB — BASIC METABOLIC PANEL
BUN: 25 mg/dL — ABNORMAL HIGH (ref 6–23)
CO2: 24 mEq/L (ref 19–32)
Calcium: 9.6 mg/dL (ref 8.4–10.5)
Chloride: 107 mEq/L (ref 96–112)
Creatinine, Ser: 0.88 mg/dL (ref 0.40–1.20)
GFR: 56.36 mL/min — ABNORMAL LOW (ref >60.00)
Glucose, Bld: 122 mg/dL — ABNORMAL HIGH (ref 70–99)
Potassium: 4.6 mEq/L (ref 3.5–5.1)
Sodium: 140 mEq/L (ref 135–145)

## 2020-05-21 LAB — CBC WITH DIFFERENTIAL/PLATELET
Basophils Absolute: 0.1 10*3/uL (ref 0.0–0.1)
Basophils Relative: 1.2 % (ref 0.0–3.0)
Eosinophils Absolute: 0.3 10*3/uL (ref 0.0–0.7)
Eosinophils Relative: 3.7 % (ref 0.0–5.0)
HCT: 44.7 % (ref 36.0–46.0)
Hemoglobin: 15.1 g/dL — ABNORMAL HIGH (ref 12.0–15.0)
Lymphocytes Relative: 27.1 % (ref 12.0–46.0)
Lymphs Abs: 2.1 10*3/uL (ref 0.7–4.0)
MCHC: 33.7 g/dL (ref 30.0–36.0)
MCV: 95.2 fl (ref 78.0–100.0)
Monocytes Absolute: 0.8 10*3/uL (ref 0.1–1.0)
Monocytes Relative: 10.3 % (ref 3.0–12.0)
Neutro Abs: 4.5 10*3/uL (ref 1.4–7.7)
Neutrophils Relative %: 57.7 % (ref 43.0–77.0)
Platelets: 236 10*3/uL (ref 150.0–400.0)
RBC: 4.7 Mil/uL (ref 3.87–5.11)
RDW: 13 % (ref 11.5–15.5)
WBC: 7.8 10*3/uL (ref 4.0–10.5)

## 2020-05-21 LAB — LIPID PANEL
Cholesterol: 187 mg/dL (ref 0–200)
HDL: 35.3 mg/dL — ABNORMAL LOW (ref >39.00)
LDL Cholesterol: 117 mg/dL — ABNORMAL HIGH (ref 0–99)
NonHDL: 151.71
Total CHOL/HDL Ratio: 5
Triglycerides: 176 mg/dL — ABNORMAL HIGH (ref 0.0–149.0)
VLDL: 35.2 mg/dL (ref 0.0–40.0)

## 2020-05-21 LAB — T4, FREE: Free T4: 0.96 ng/dL (ref 0.60–1.60)

## 2020-05-21 LAB — TSH: TSH: 2.71 u[IU]/mL (ref 0.35–4.50)

## 2020-05-21 LAB — HEMOGLOBIN A1C: Hgb A1c MFr Bld: 6.2 % (ref 4.6–6.5)

## 2020-05-21 NOTE — Patient Instructions (Signed)
Will notify you  of labs when available.   Continue   Activity as tolerated   Fall prevention.   Glad you are enjoying  Life.    Understanding Your Risk for Falls Each year, millions of people have serious injuries from falls. It is important to understand your risk for falling. Talk with your health care provider about your risk and what you can do to lower it. There are actions you can take at home to lower your risk and prevent falls. If you do have a serious fall, make sure to tell your health care provider. Falling once raises your risk of falling again. How can falls affect me? Serious injuries from falls are common. These include:  Broken bones, such as hip fractures.  Head injuries, such as traumatic brain injuries (TBI) or concussion. A fear of falling can cause you to avoid activities and stay at home. This can make your muscles weaker and actually raise your risk for a fall. What can increase my risk? There are a number of risk factors that increase your risk for falling. The more risk factors you have, the higher your risk of falling. Serious injuries from a fall happen most often to people older than age 16. Children and young adults ages 72-29 are also at higher risk. Common risk factors include:  Weakness in the lower body.  Lack (deficiency) of vitamin D.  Being generally weak or confused due to long-term (chronic) illness.  Dizziness or balance problems.  Poor vision.  Medicines that cause dizziness or drowsiness. These can include medicines for your blood pressure, heart, anxiety, insomnia, or edema, as well as pain medicines and muscle relaxants. Other risk factors include:  Drinking alcohol.  Having had a fall in the past.  Having depression.  Having foot pain or wearing improper footwear.  Working at a dangerous job.  Having any of the following in your home: ? Tripping hazards, such as floor clutter or loose rugs. ? Poor lighting. ? Pets.  Having  dementia or memory loss. What actions can I take to lower my risk of falling? Physical activity Maintain physical fitness. Do strength and balance exercises. Consider taking a regular class to build strength and balance. Yoga and tai chi are good options. Vision Have your eyes checked every year and your vision prescription updated as needed. Walking aids and footwear  Wear nonskid shoes. Do not wear high heels.  Do not walk around the house in socks or slippers.  Use a cane or walker as told by your health care provider. Home safety  Attach secure railings on both sides of your stairs.  Install grab bars for your tub, shower, and toilet. Use a bath mat in your tub or shower.  Use good lighting in all rooms. Keep a flashlight near your bed.  Make sure there is a clear path from your bed to the bathroom. Use night-lights.  Do not use throw rugs. Make sure all carpeting is taped or tacked down securely.  Remove all clutter from walkways and stairways, including extension cords.  Repair uneven or broken steps.  Avoid walking on icy or slippery surfaces. Walk on the grass instead of on icy or slick sidewalks. Use ice melt to get rid of ice on walkways.  Use a cordless phone.      Questions to ask your health care provider  Can you help me check my risk for a fall?  Do any of my medicines make me more likely to  fall?  Should I take a vitamin D supplement?  What exercises can I do to improve my strength and balance?  Should I make an appointment to have my vision checked?  Do I need a bone density test to check for weak bones or osteoporosis?  Would it help to use a cane or a walker? Where to find more information  Centers for Disease Control and Prevention, STEADI: http://www.wolf.info/  Community-Based Fall Prevention Programs: http://www.wolf.info/  National Institute on Aging: http://kim-miller.com/ Contact a health care provider if:  You fall at home.  You are afraid of falling at  home.  You feel weak, drowsy, or dizzy. Summary  Serious injuries from a fall happen most often to people older than age 77. Children and young adults ages 8-29 are also at higher risk.  Talk with your health care provider about your risks for falling and how to lower those risks.  Taking certain precautions at home can lower your risk for falling.  If you fall, always tell your health care provider. This information is not intended to replace advice given to you by your health care provider. Make sure you discuss any questions you have with your health care provider. Document Revised: 09/20/2019 Document Reviewed: 09/20/2019 Elsevier Patient Education  [place].

## 2020-05-25 NOTE — Progress Notes (Signed)
Results stable triglycerides better   sugar borderline .  Kidney function stable  no anemia  No change in plans

## 2020-05-30 ENCOUNTER — Ambulatory Visit (INDEPENDENT_AMBULATORY_CARE_PROVIDER_SITE_OTHER): Payer: Medicare Other | Admitting: Otolaryngology

## 2020-06-25 IMAGING — US US ABDOMEN COMPLETE
1 series · 14 of 25 positions shown · non-contrast
Comparison: CT 03/24/2017.

CLINICAL DATA: 3.  Bilateral simple renal cysts.

EXAM:
ABDOMEN ULTRASOUND COMPLETE

[Series 1: us abdomen complete · 0.19mm/px · 14 of 106 slices shown]
[im 1/106]
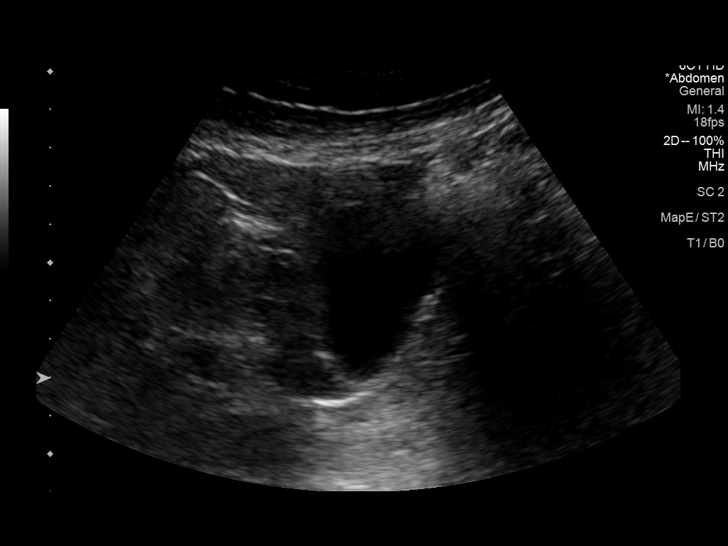
[im 9/106]
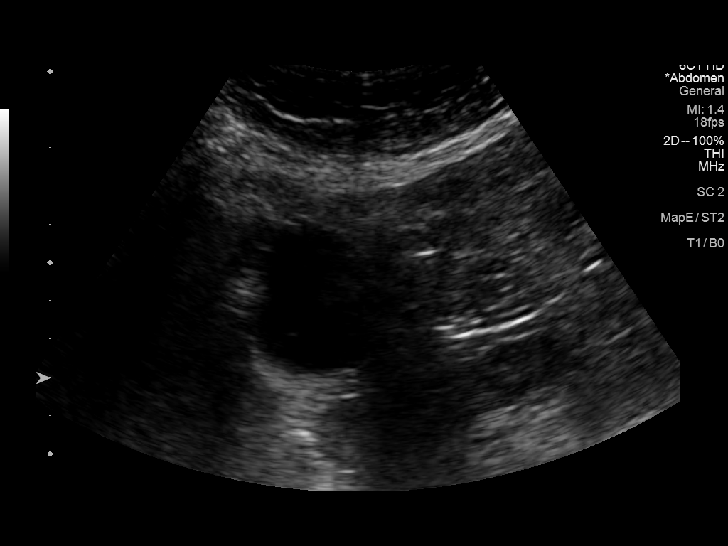
[im 18/106]
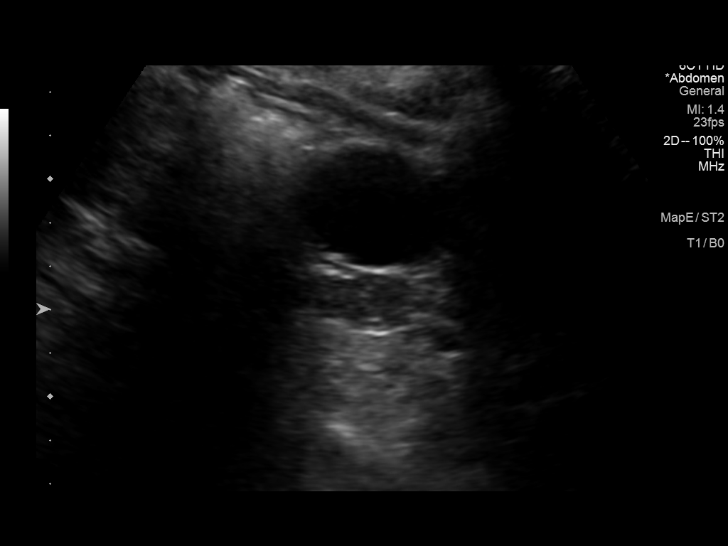
[im 27/106]
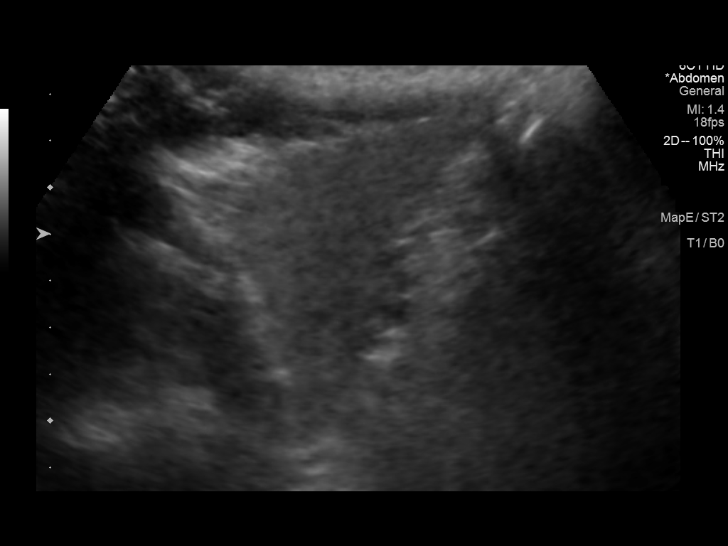
[im 36/106]
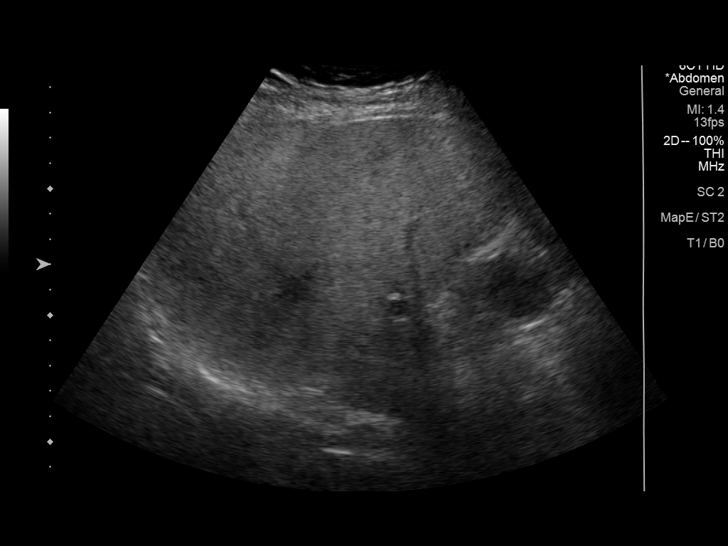
[im 40/106]
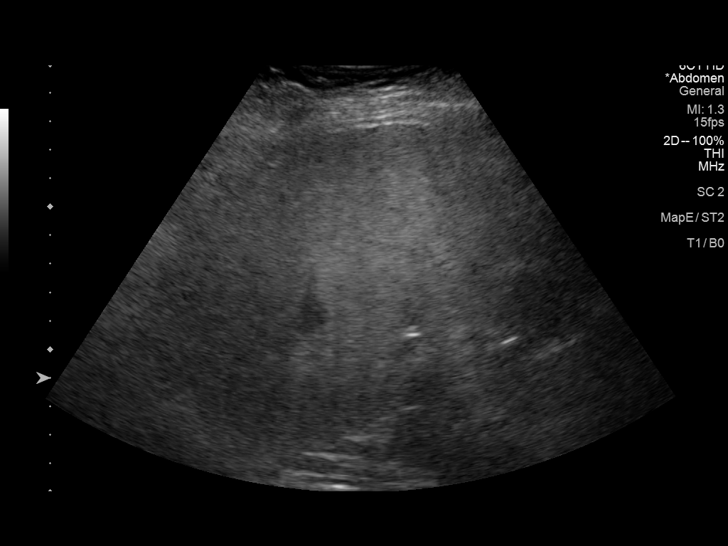
[im 49/106]
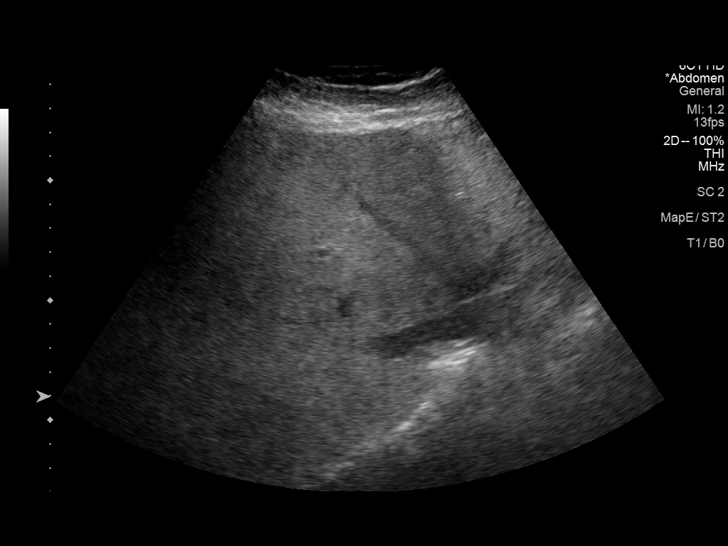
[im 57/106]
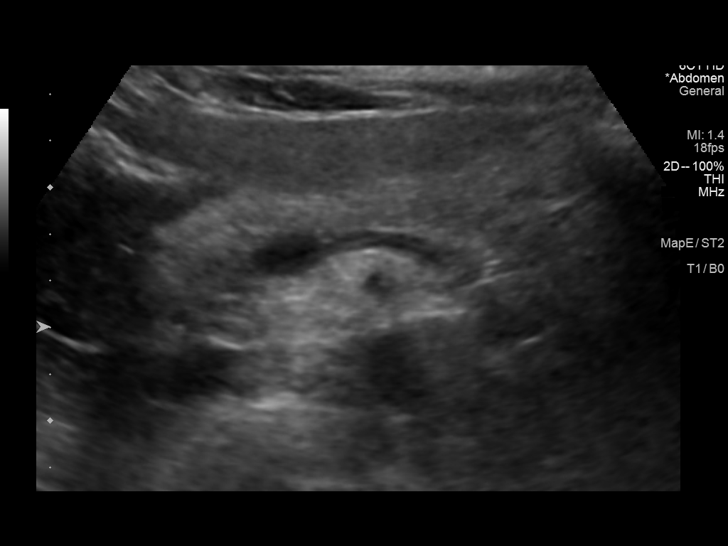
[im 66/106]
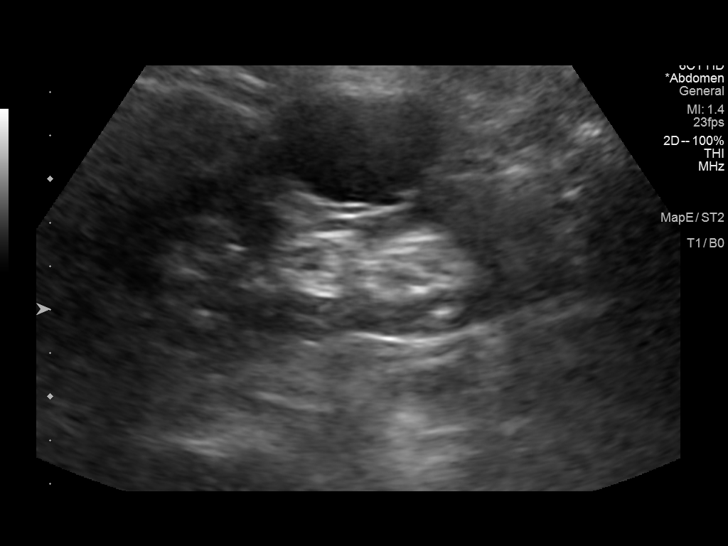
[im 71/106]
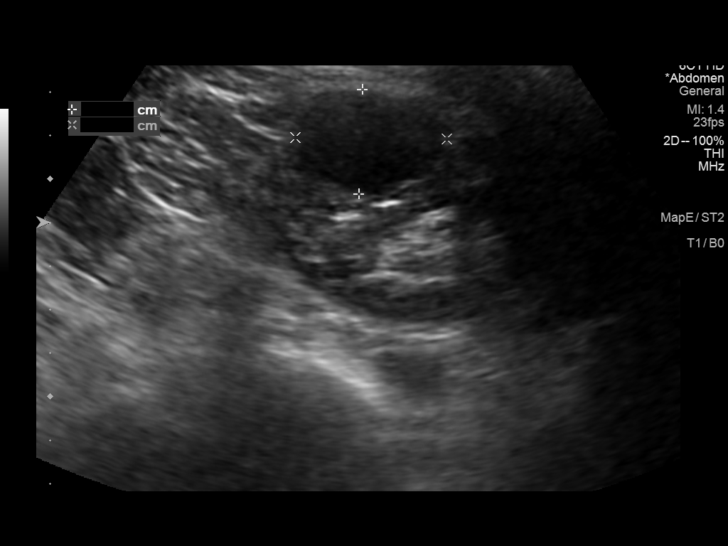
[im 79/106]
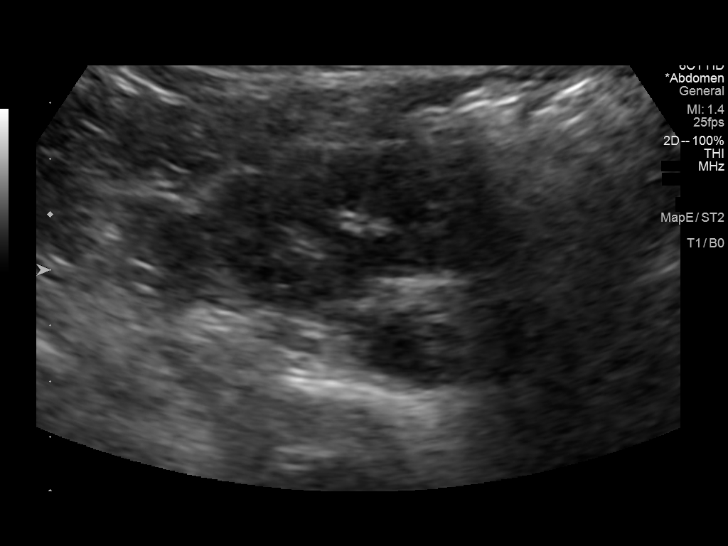
[im 88/106]
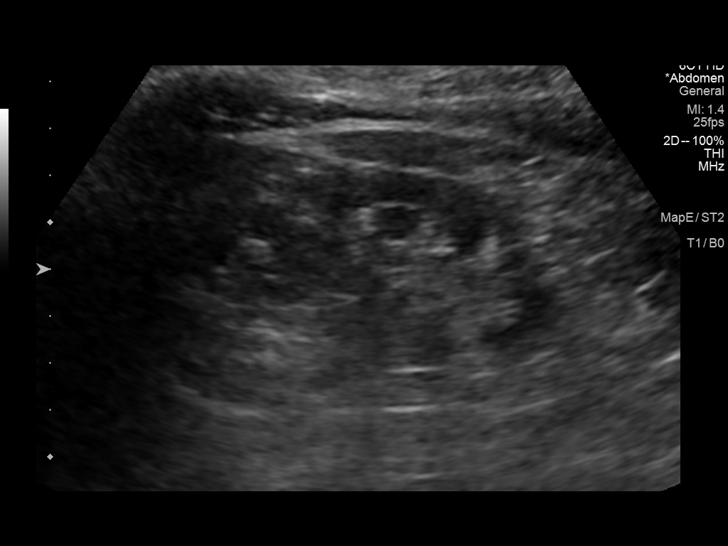
[im 97/106]
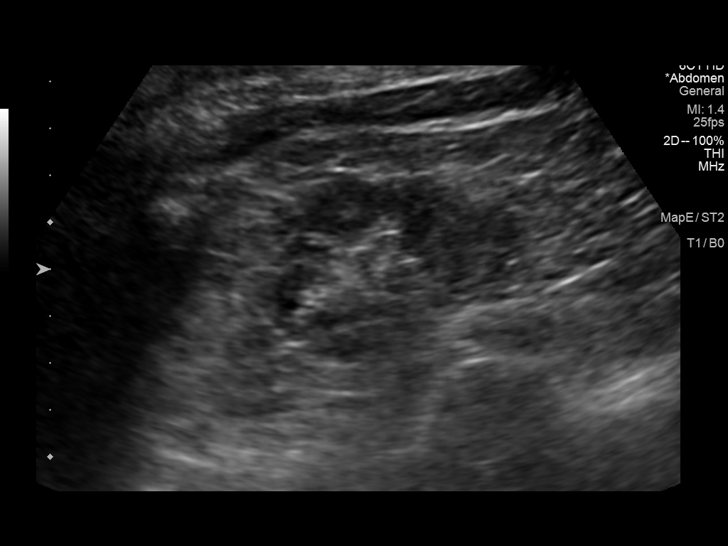
[im 106/106]
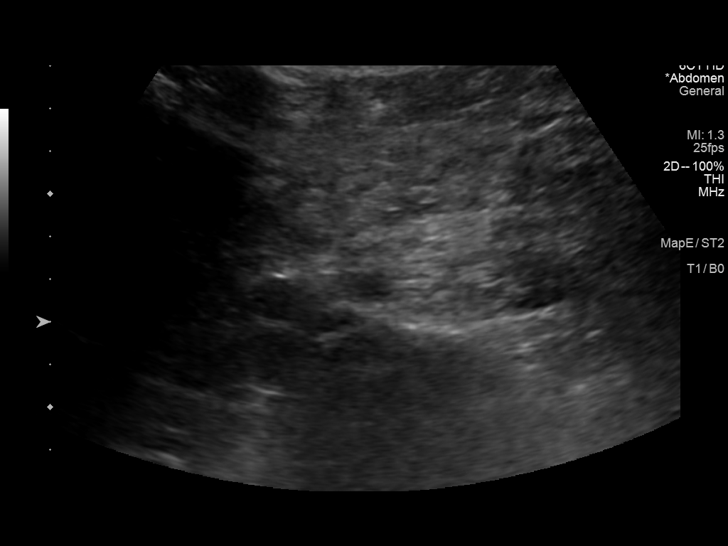

[14 of 25 positions shown; findings below may reference images not displayed]

FINDINGS: Gallbladder: No gallstones or wall thickening visualized. No
sonographic Murphy sign noted by sonographer.

Common bile duct: Diameter: 4.0 mm

Liver: Increased echogenicity consistent fatty infiltration or
hepatocellular disease. Portal vein is patent on color Doppler
imaging with normal direction of blood flow towards the liver.

IVC: No abnormality visualized.

Pancreas: Visualized portion unremarkable.

Spleen: Size and appearance within normal limits.

Right Kidney: Length: 10.0 cm. Echogenicity within normal limits.
3.5 cm simple cyst. No hydronephrosis visualized.

Left Kidney: Length: 8.9 cm. Echogenicity within normal limits.
cm simple cyst. No hydronephrosis visualized.

Abdominal aorta: No aneurysm visualized.

Other findings: None.
IMPRESSION: 1. No acute abnormality. No gallstones or biliary distention. 2.
Increased hepatic echogenicity consistent fatty infiltration and/or
hepatocellular disease.

## 2020-07-15 ENCOUNTER — Telehealth: Payer: Self-pay | Admitting: Pharmacist

## 2020-07-15 NOTE — Chronic Care Management (AMB) (Signed)
    Chronic Care Management Pharmacy Assistant   Name: Julie Boone  MRN: [id number] DOB: Apr 10, 1924  Reason for Encounter: General Adherence Call       Recent office visits:  . 03.22.2022 Burnis Medin, MD(PCP) - annual exam  Recent consult visits:  . 11.22.2022 Elouise Munroe, MD Cardiology Follow-for Essential Hypertension . 09.16.2021 [name], MD Otolaryngology- acute visit for cerumen buildup . 07.07.2021 Elouise Munroe, MD Cardiology Follow-for Essential Hypertension  Hospital visits:  None in previous 6 months  Medications: Outpatient Encounter Medications as of 07/15/2020  Medication Sig  . apixaban (ELIQUIS) 5 MG TABS tablet Take 1 tablet (5 mg total) by mouth 2 (two) times daily.  [name] atenolol (TENORMIN) 50 MG tablet TAKE 1 TABLET (50 MG TOTAL) BY MOUTH DAILY. TAKE 1 AND 1/2 TABLETS EVERY DAY  . Cholecalciferol (VITAMIN D-3) 125 MCG (5000 UT) TABS Take 1 tablet by mouth daily.   . Cyanocobalamin (VITAMIN B12) 3000 MCG SUBL Place 1 tablet under the tongue daily.  [name] lisinopril (ZESTRIL) 40 MG tablet Take 1 tablet (40 mg total) by mouth daily. May decrease to 20 mg per day as indicated (Patient taking differently: Take 20 mg by mouth in the morning and at bedtime. May decrease to 20 mg per day as indicated twice daily)  . meclizine (ANTIVERT) 25 MG tablet 1/2 to 1  po up to every 8 hours as needed for vertigo  Fall precautions  . Omega-3 Fatty Acids (FISH OIL) 500 MG CAPS Take 500 mg by mouth 3 (three) times a week.  [name] UNABLE TO FIND Med Name: [place] leg cramps ho meo pathix med  Takes as needed for leg cramps says it works well   No facility-administered encounter medications on file as of 07/15/2020.   I spoke with the patient's daughter Pamala Hurry on [place]) about medication adherence. She stated that her mother has been doing well. She is not experiencing any side effects from her current medications. There have been no recent changes to her  medications. She has not recently had any falls or injuries. There have been no emergency department or urgent care visits since her last PCP or PCP visit. And she is not experiencing any issues with her pharmacy.  Star Rating Drugs:  Dispensed Quantity Pharmacy  Lisinopril 40 mg 01.21.2022 [facility] [place], [place] Pharmacist Assistant [phone]

## 2020-07-22 ENCOUNTER — Other Ambulatory Visit: Payer: Self-pay | Admitting: Internal Medicine

## 2020-07-30 ENCOUNTER — Ambulatory Visit (INDEPENDENT_AMBULATORY_CARE_PROVIDER_SITE_OTHER): Payer: Medicare Other

## 2020-07-30 ENCOUNTER — Other Ambulatory Visit: Payer: Self-pay

## 2020-07-30 VITALS — BP 132/78 | HR 85 | Temp 97.9°F | Wt 164.2 lb

## 2020-07-30 DIAGNOSIS — Z0000 Encounter for general adult medical examination without abnormal findings: Secondary | ICD-10-CM

## 2020-07-30 NOTE — Patient Instructions (Signed)
Julie Boone , Thank you for taking time to come for your Medicare Wellness Visit. I appreciate your ongoing commitment to your health goals. Please review the following plan we discussed and let me know if I can assist you in the future.   Screening recommendations/referrals: Colonoscopy: No longer required Mammogram: Done 02/15/17 Bone Density: Done 02/10/10 Recommended yearly ophthalmology/optometry visit for glaucoma screening and checkup Recommended yearly dental visit for hygiene and checkup  Vaccinations: Influenza vaccine: Due in season Pneumococcal vaccine: not candidiate Tdap vaccine: Due and discussed Shingles vaccine: Shingrix discussed. Please contact your pharmacy for coverage information.    Covid-19:Completed 1/20, 2/8, & 02/21/20  Advanced directives: Please bring a copy of your health care power of attorney and living will to the office at your convenience.  Conditions/risks identified: Stay active   Next appointment: Follow up in one year for your annual wellness visit    Preventive Care 65 Years and Older, Female Preventive care refers to lifestyle choices and visits with your health care provider that can promote health and wellness. What does preventive care include?  A yearly physical exam. This is also called an annual well check.  Dental exams once or twice a year.  Routine eye exams. Ask your health care provider how often you should have your eyes checked.  Personal lifestyle choices, including:  Daily care of your teeth and gums.  Regular physical activity.  Eating a healthy diet.  Avoiding tobacco and drug use.  Limiting alcohol use.  Practicing safe sex.  Taking low-dose aspirin every day.  Taking vitamin and mineral supplements as recommended by your health care provider. What happens during an annual well check? The services and screenings done by your health care provider during your annual well check will depend on your age,  overall health, lifestyle risk factors, and family history of disease. Counseling  Your health care provider may ask you questions about your:  Alcohol use.  Tobacco use.  Drug use.  Emotional well-being.  Home and relationship well-being.  Sexual activity.  Eating habits.  History of falls.  Memory and ability to understand (cognition).  Work and work Statistician.  Reproductive health. Screening  You may have the following tests or measurements:  Height, weight, and BMI.  Blood pressure.  Lipid and cholesterol levels. These may be checked every 5 years, or more frequently if you are over 45 years old.  Skin check.  Lung cancer screening. You may have this screening every year starting at age 62 if you have a 30-pack-year history of smoking and currently smoke or have quit within the past 15 years.  Fecal occult blood test (FOBT) of the stool. You may have this test every year starting at age 49.  Flexible sigmoidoscopy or colonoscopy. You may have a sigmoidoscopy every 5 years or a colonoscopy every 10 years starting at age 3.  Hepatitis C blood test.  Hepatitis B blood test.  Sexually transmitted disease (STD) testing.  Diabetes screening. This is done by checking your blood sugar (glucose) after you have not eaten for a while (fasting). You may have this done every 1-3 years.  Bone density scan. This is done to screen for osteoporosis. You may have this done starting at age 69.  Mammogram. This may be done every 1-2 years. Talk to your health care provider about how often you should have regular mammograms. Talk with your health care provider about your test results, treatment options, and if necessary, the need for more tests. Vaccines  Your health care provider may recommend certain vaccines, such as:  Influenza vaccine. This is recommended every year.  Tetanus, diphtheria, and acellular pertussis (Tdap, Td) vaccine. You may need a Td booster every 10  years.  Zoster vaccine. You may need this after age 85.  Pneumococcal 13-valent conjugate (PCV13) vaccine. One dose is recommended after age 14.  Pneumococcal polysaccharide (PPSV23) vaccine. One dose is recommended after age 45. Talk to your health care provider about which screenings and vaccines you need and how often you need them. This information is not intended to replace advice given to you by your health care provider. Make sure you discuss any questions you have with your health care provider. Document Released: 03/15/2015 Document Revised: 11/06/2015 Document Reviewed: 12/18/2014 Elsevier Interactive Patient Education  2017 [place] Prevention in the Home Falls can cause injuries. They can happen to people of all ages. There are many things you can do to make your home safe and to help prevent falls. What can I do on the outside of my home?  Regularly fix the edges of walkways and driveways and fix any cracks.  Remove anything that might make you trip as you walk through a door, such as a raised step or threshold.  Trim any bushes or trees on the path to your home.  Use bright outdoor lighting.  Clear any walking paths of anything that might make someone trip, such as rocks or tools.  Regularly check to see if handrails are loose or broken. Make sure that both sides of any steps have handrails.  Any raised decks and porches should have guardrails on the edges.  Have any leaves, snow, or ice cleared regularly.  Use sand or salt on walking paths during winter.  Clean up any spills in your garage right away. This includes oil or grease spills. What can I do in the bathroom?  Use night lights.  Install grab bars by the toilet and in the tub and shower. Do not use towel bars as grab bars.  Use non-skid mats or decals in the tub or shower.  If you need to sit down in the shower, use a plastic, non-slip stool.  Keep the floor dry. Clean up any water that  spills on the floor as soon as it happens.  Remove soap buildup in the tub or shower regularly.  Attach bath mats securely with double-sided non-slip rug tape.  Do not have throw rugs and other things on the floor that can make you trip. What can I do in the bedroom?  Use night lights.  Make sure that you have a light by your bed that is easy to reach.  Do not use any sheets or blankets that are too big for your bed. They should not hang down onto the floor.  Have a firm chair that has side arms. You can use this for support while you get dressed.  Do not have throw rugs and other things on the floor that can make you trip. What can I do in the kitchen?  Clean up any spills right away.  Avoid walking on wet floors.  Keep items that you use a lot in easy-to-reach places.  If you need to reach something above you, use a strong step stool that has a grab bar.  Keep electrical cords out of the way.  Do not use floor polish or wax that makes floors slippery. If you must use wax, use non-skid floor wax.  Do  not have throw rugs and other things on the floor that can make you trip. What can I do with my stairs?  Do not leave any items on the stairs.  Make sure that there are handrails on both sides of the stairs and use them. Fix handrails that are broken or loose. Make sure that handrails are as long as the stairways.  Check any carpeting to make sure that it is firmly attached to the stairs. Fix any carpet that is loose or worn.  Avoid having throw rugs at the top or bottom of the stairs. If you do have throw rugs, attach them to the floor with carpet tape.  Make sure that you have a light switch at the top of the stairs and the bottom of the stairs. If you do not have them, ask someone to add them for you. What else can I do to help prevent falls?  Wear shoes that:  Do not have high heels.  Have rubber bottoms.  Are comfortable and fit you well.  Are closed at the  toe. Do not wear sandals.  If you use a stepladder:  Make sure that it is fully opened. Do not climb a closed stepladder.  Make sure that both sides of the stepladder are locked into place.  Ask someone to hold it for you, if possible.  Clearly mark and make sure that you can see:  Any grab bars or handrails.  First and last steps.  Where the edge of each step is.  Use tools that help you move around (mobility aids) if they are needed. These include:  Canes.  Walkers.  Scooters.  Crutches.  Turn on the lights when you go into a dark area. Replace any light bulbs as soon as they burn out.  Set up your furniture so you have a clear path. Avoid moving your furniture around.  If any of your floors are uneven, fix them.  If there are any pets around you, be aware of where they are.  Review your medicines with your doctor. Some medicines can make you feel dizzy. This can increase your chance of falling. Ask your doctor what other things that you can do to help prevent falls. This information is not intended to replace advice given to you by your health care provider. Make sure you discuss any questions you have with your health care provider. Document Released: 12/13/2008 Document Revised: 07/25/2015 Document Reviewed: 03/23/2014 Elsevier Interactive Patient Education  2017 [employer].

## 2020-07-30 NOTE — Progress Notes (Signed)
Subjective:   Julie Boone is a 87 y.o. female who presents for Medicare Annual (Subsequent) preventive examination.  Review of Systems     Cardiac Risk Factors include: advanced age (>70men, >68 women);hypertension     Objective:    Today's Vitals   07/30/20 1526  BP: 132/78  Pulse: 85  Temp: 97.9 F (36.6 C)  SpO2: 95%  Weight: 164 lb 3.2 oz (74.5 kg)   Body mass index is 29.09 kg/m.  Advanced Directives 07/30/2020 02/27/2019 11/23/2017 03/24/2017 10/08/2016 11/18/2015 10/10/2014  Does Patient Have a Medical Advance Directive? Yes Yes Yes No;Yes No No No  Type of Paramedic of [place];Living will - [place];Living will - - -  Does patient want to make changes to medical advance directive? - No - Patient declined - No - Patient declined - - -  Copy of [place] in Chart? No - copy requested No - copy requested - No - copy requested - - -  Would patient like information on creating a medical advance directive? - - - No - Patient declined - No - patient declined information -    Current Medications (verified) Outpatient Encounter Medications as of 07/30/2020  Medication Sig  . apixaban (ELIQUIS) 5 MG TABS tablet Take 1 tablet (5 mg total) by mouth 2 (two) times daily.  [name] atenolol (TENORMIN) 50 MG tablet TAKE 1 TABLET (50 MG TOTAL) BY MOUTH DAILY. TAKE 1 AND 1/2 TABLETS EVERY DAY  . Cholecalciferol (VITAMIN D-3) 125 MCG (5000 UT) TABS Take 1 tablet by mouth daily.   . Cyanocobalamin (VITAMIN B12) 3000 MCG SUBL Place 1 tablet under the tongue daily.  [name] lisinopril (ZESTRIL) 40 MG tablet TAKE 1 TABLET BY MOUTH DAILY. MAY DECREASE TO 1/2 TABLET BY MOUTH PER DAY AS INDICATED  . Omega-3 Fatty Acids (FISH OIL) 500 MG CAPS Take 500 mg by mouth 3 (three) times a week.  [name] UNABLE TO FIND Med Name: [place] leg cramps ho meo pathix med  Takes as needed for leg cramps says it works well  .  meclizine (ANTIVERT) 25 MG tablet 1/2 to 1  po up to every 8 hours as needed for vertigo  Fall precautions (Patient not taking: Reported on 07/30/2020)   No facility-administered encounter medications on file as of 07/30/2020.    Allergies (verified) Shellfish allergy and Morphine and related   History: Past Medical History:  Diagnosis Date  . Acute sinusitis with symptoms greater than 10 days 06/23/2013  . Arthritis   . Breast cancer ([place])    mastectomy 3   neg ln   . HOH (hard of hearing)   . HTN (hypertension)   . Wears glasses   . Wears hearing aid    both ears   Past Surgical History:  Procedure Laterality Date  . APPENDECTOMY  2006  . BREAST BIOPSY Left 2012  . COLONOSCOPY    . HERNIA REPAIR    . INSERTION OF MESH N/A 09/05/2013   Procedure: INSERTION OF MESH;  Surgeon: Joyice Faster. Cornett, MD;  Location: [facility];  Service: General;  Laterality: N/A;  . MASTECTOMY Left 2013  . TONSILLECTOMY AND ADENOIDECTOMY     Childhood  . UMBILICAL HERNIA REPAIR N/A 09/05/2013   Procedure: HERNIA REPAIR UMBILICAL ADULT WITH MESH;  Surgeon: Joyice Faster. Cornett, MD;  Location: [facility];  Service: General;  Laterality: N/A;   Family History  Problem Relation  Age of Onset  . Hypertension Mother   . Stroke Mother   . Heart failure Mother   . Lung disease Sister        smoker  . Colon cancer Father        dx in his late 36's  . Esophageal cancer Neg Hx   . Inflammatory bowel disease Neg Hx   . Liver disease Neg Hx   . Pancreatic cancer Neg Hx   . Stomach cancer Neg Hx    Social History   Socioeconomic History  . Marital status: Widowed    Spouse name: Not on file  . Number of children: 1  . Years of education: 31  . Highest education level: High school graduate  Occupational History    Comment: retired  Tobacco Use  . Smoking status: Never Smoker  . Smokeless tobacco: Never Used  Vaping Use  . Vaping Use: Never used  Substance and Sexual  Activity  . Alcohol use: Yes    Alcohol/week: 0.0 standard drinks    Comment: 1 time a month. has a sip ever now and then   . Drug use: No  . Sexual activity: Not on file  Other Topics Concern  . Not on file  Social History Narrative   2 people living in the home   Dog Small [place]orkie and [place]   From [place] moved from [place]  Adopted daughter nearby.      Neg ets  Wine with meals  No tob rd.   G0P0   Married husband [place] background  [place] descent.   Husband passed CHF and prostate cancer 2017   Now living with daughter         Social Determinants of Health   Financial Resource Strain: Low Risk   . Difficulty of Paying Living Expenses: Not hard at all  Food Insecurity: No Food Insecurity  . Worried About Charity fundraiser in the Last Year: Never true  . Ran Out of Food in the Last Year: Never true  Transportation Needs: No Transportation Needs  . Lack of Transportation (Medical): No  . Lack of Transportation (Non-Medical): No  Physical Activity: Inactive  . Days of Exercise per Week: 0 days  . Minutes of Exercise per Session: 0 min  Stress: Stress Concern Present  . Feeling of Stress : To some extent  Social Connections: Moderately Isolated  . Frequency of Communication with Friends and Family: More than three times a week  . Frequency of Social Gatherings with Friends and Family: Twice a week  . Attends Religious Services: 1 to 4 times per year  . Active Member of Clubs or Organizations: No  . Attends Archivist Meetings: Never  . Marital Status: Widowed    Tobacco Counseling Counseling given: Not Answered   Clinical Intake:  Pre-visit preparation completed: Yes  Pain : No/denies pain     BMI - recorded: 29.09 Nutritional Status: BMI 25 -29 Overweight Nutritional Risks: None Diabetes: No  How often do you need to have someone help you when you read instructions, pamphlets, or other written materials from your doctor or pharmacy?: 1 -  Never  Diabetic?no  Interpreter Needed?: No  Information entered by :: Charlott Rakes, LPN   Activities of Daily Living In your present state of health, do you have any difficulty performing the following activities: 07/30/2020  Hearing? Y  Comment wears hearing aids  Vision? N  Difficulty concentrating or making decisions? N  Walking or climbing  stairs? N  Dressing or bathing? N  Doing errands, shopping? N  Preparing Food and eating ? N  Using the Toilet? N  In the past six months, have you accidently leaked urine? N  Do you have problems with loss of bowel control? N  Managing your Medications? N  Managing your Finances? N  Housekeeping or managing your Housekeeping? N  Some recent data might be hidden    Patient Care Team: Panosh, Standley Brooking, MD as PCP - General (Internal Medicine) Elouise Munroe, MD as PCP - Cardiology (Cardiology) Erroll Luna, MD as Consulting Physician (General Surgery) Inocencio Homes, DPM as Consulting Physician (Podiatry) Earnie Larsson, [facility] as Pharmacist (Pharmacist)  Indicate any recent Medical Services you may have received from other than [facility] providers in the past year (date may be approximate).     Assessment:   This is a routine wellness examination for [place].  Hearing/Vision screen  Hearing Screening   125Hz  250Hz  500Hz  1000Hz  2000Hz  3000Hz  4000Hz  6000Hz  8000Hz   Right ear:           Left ear:           Comments: Pt wears hearing aids   Vision Screening Comments: Pt follows up with [facility] opthalmology Dr Deon Pilling for annual eye exams   Dietary issues and exercise activities discussed: Current Exercise Habits: Home exercise routine, Type of exercise: walking (and house work gardening)  Goals Addressed            This [place]   . Patient Stated       Maintain activities and take care of self       Depression Screen PHQ 2/9 Scores 07/30/2020 05/21/2020 02/27/2019 11/23/2017 12/02/2016 10/31/2014 01/22/2014   PHQ - 2 Score 0 0 0 1 0 0 0    Fall Risk Fall Risk  07/30/2020 05/21/2020 02/27/2019 11/23/2017 12/02/2016  Falls in the past year? 0 0 0 No No  Number falls in past yr: 0 0 - - -  Injury with Fall? 0 0 - - -  Risk for fall due to : Impaired vision;Impaired balance/gait - - - -  Follow up Falls prevention discussed - - - -    FALL RISK PREVENTION PERTAINING TO THE HOME:  Any stairs in or around the home? No  If so, are there any without handrails? No  Home free of loose throw rugs in walkways, pet beds, electrical cords, etc? Yes  Adequate lighting in your home to reduce risk of falls? Yes   ASSISTIVE DEVICES UTILIZED TO PREVENT FALLS:  Life alert? No  Use of a cane, walker or w/c? No  Grab bars in the bathroom? Yes  Shower chair or bench in shower? Yes  Elevated toilet seat or a handicapped toilet? Yes   TIMED UP AND GO:  Was the test performed? Yes .  Length of time to ambulate 10 feet: 15 sec.   Gait slow and steady without use of assistive device  Cognitive Function:     6CIT Screen 07/30/2020 02/27/2019  What Year? 0 points 0 points  What month? 0 points 0 points  What time? 0 points 0 points  Count back from 20 0 points 0 points  Months in reverse 0 points 0 points  Repeat phrase 0 points 0 points  Total Score 0 0    Immunizations Immunization History  Administered Date(s) Administered  . PFIZER(Purple Top)SARS-COV-2 Vaccination 03/22/2019, 04/10/2019, 02/21/2020    TDAP status: Due, Education has been provided regarding the  importance of this vaccine. Advised may receive this vaccine at local pharmacy or Health Dept. Aware to provide a copy of the vaccination record if obtained from local pharmacy or Health Dept. Verbalized acceptance and understanding.  Flu Vaccine status: Due, Education has been provided regarding the importance of this vaccine. Advised may receive this vaccine at local pharmacy or Health Dept. Aware to provide a copy of the vaccination  record if obtained from local pharmacy or Health Dept. Verbalized acceptance and understanding.    Covid-19 vaccine status: Completed vaccines  Qualifies for Shingles Vaccine? Yes   Zostavax completed No   Shingrix Completed?: No.    Education has been provided regarding the importance of this vaccine. Patient has been advised to call insurance company to determine out of pocket expense if they have not yet received this vaccine. Advised may also receive vaccine at local pharmacy or Health Dept. Verbalized acceptance and understanding.  Screening Tests Health Maintenance  Topic Date Due  . TETANUS/TDAP  Never done  . Zoster Vaccines- Shingrix (1 of 2) Never done  . MAMMOGRAM  02/15/2018  . COVID-19 Vaccine (4 - Booster for Pfizer series) 05/21/2020  . INFLUENZA VACCINE  09/30/2020  . DEXA SCAN  Completed  . HPV VACCINES  Aged Out    Health Maintenance  Health Maintenance Due  Topic Date Due  . TETANUS/TDAP  Never done  . Zoster Vaccines- Shingrix (1 of 2) Never done  . MAMMOGRAM  02/15/2018  . COVID-19 Vaccine (4 - Booster for Pfizer series) 05/21/2020    Colorectal cancer screening: No longer required.   Mammogram status: Completed 02/15/17. Repeat every year  Bone Density status: Completed 02/10/10. Results reflect: Bone density results: OSTEOPOROSIS. Repeat every 3-5 years.   Additional Screening:   Vision Screening: Recommended annual ophthalmology exams for early detection of glaucoma and other disorders of the eye. Is the patient up to date with their annual eye exam?  Yes  Who is the provider or what is the name of the office in which the patient attends annual eye exams? Dr Deon Pilling If pt is not established with a provider, would they like to be referred to a provider to establish care? No .   Dental Screening: Recommended annual dental exams for proper oral hygiene  Community Resource Referral / Chronic Care Management: CRR required this visit?  No   CCM  required this visit?  No      Plan:     I have personally reviewed and noted the following in the patient's chart:   . Medical and social history . Use of alcohol, tobacco or illicit drugs  . Current medications and supplements including opioid prescriptions.  . Functional ability and status . Nutritional status . Physical activity . Advanced directives . List of other physicians . Hospitalizations, surgeries, and ER visits in previous 12 months . Vitals . Screenings to include cognitive, depression, and falls . Referrals and appointments  In addition, I have reviewed and discussed with patient certain preventive protocols, quality metrics, and best practice recommendations. A written personalized care plan for preventive services as well as general preventive health recommendations were provided to patient.     Willette Brace, LPN   [id number]   Nurse Notes: None

## 2020-08-12 MED ORDER — ATENOLOL 50 MG PO TABS: ORAL_TABLET | ORAL | 1 refills | Status: DC

## 2020-09-08 NOTE — Progress Notes (Signed)
Chief Complaint  Patient presents with   Rash    Patient complains of rash, Patient complains of itchiness withno pain, Patient reports tingling, x3 weeks, Tried [place] lotion, Tylenol, Benadryl with little relief.     HPI: Julie Boone 87 y.o. come in for new problem here with daughter.  For the last 3 weeks she has had severe itching began with bumps around her ankles and then antecubital area and some on back.  Some on trunk using benadryl which helps for couple of hours    Has very dry skin.  Daughter states she does not let her put on lotion.  Takes a very quick shower. No exposures daughter has no rash nor exposure to people who could have scabies or similar. No new meds may be some dietary changes.  Hard to sleep with the itching.  None on face  ROS: See pertinent positives and negatives per HPI.  Past Medical History:  Diagnosis Date   Acute sinusitis with symptoms greater than 10 days 06/23/2013   Arthritis    Breast cancer ([place])    mastectomy 3   neg ln    HOH (hard of hearing)    HTN (hypertension)    Wears glasses    Wears hearing aid    both ears    Family History  Problem Relation Age of Onset   Hypertension Mother    Stroke Mother    Heart failure Mother    Lung disease Sister        smoker   Colon cancer Father        dx in his late 94's   Esophageal cancer Neg Hx    Inflammatory bowel disease Neg Hx    Liver disease Neg Hx    Pancreatic cancer Neg Hx    Stomach cancer Neg Hx     Social History   Socioeconomic History   Marital status: Widowed    Spouse name: Not on file   Number of children: 1   Years of education: 12   Highest education level: High school graduate  Occupational History    Comment: retired  Tobacco Use   Smoking status: Never   Smokeless tobacco: Never  Vaping Use   Vaping Use: Never used  Substance and Sexual Activity   Alcohol use: Yes    Alcohol/week: 0.0 standard drinks    Comment: 1 time a month. has a  sip ever now and then    Drug use: No   Sexual activity: Not on file  Other Topics Concern   Not on file  Social History Narrative   2 people living in the home   Dog Small yorkie and [place]   From [place] moved from [place]  Adopted daughter nearby.      Neg ets  Wine with meals  No tob rd.   G0P0   Married husband [place] background  Herself [place] descent.   Husband passed CHF and prostate cancer 2017   Now living with daughter         Social Determinants of Health   Financial Resource Strain: Low Risk    Difficulty of Paying Living Expenses: Not hard at all  Food Insecurity: No Food Insecurity   Worried About Charity fundraiser in the Last Year: Never true   Arboriculturist in the Last Year: Never true  Transportation Needs: No Transportation Needs   Lack of Transportation (Medical): No   Lack of Transportation (Non-Medical): No  Physical Activity: Inactive   Days of Exercise per Week: 0 days   Minutes of Exercise per Session: 0 min  Stress: Stress Concern Present   Feeling of Stress : To some extent  Social Connections: Moderately Isolated   Frequency of Communication with Friends and Family: More than three times a week   Frequency of Social Gatherings with Friends and Family: Twice a week   Attends Religious Services: 1 to 4 times per year   Active Member of [employer] or Organizations: No   Attends Archivist Meetings: Never   Marital Status: Widowed    Outpatient Medications Prior to Visit  Medication Sig Dispense Refill   apixaban (ELIQUIS) 5 MG TABS tablet Take 1 tablet (5 mg total) by mouth 2 (two) times daily. 180 tablet 3   atenolol (TENORMIN) 50 MG tablet TAKE 1 TABLET (50 MG TOTAL) BY MOUTH DAILY. TAKE 1 AND 1/2 TABLETS EVERY DAY 135 tablet 1   Cholecalciferol (VITAMIN D-3) 125 MCG (5000 UT) TABS Take 1 tablet by mouth daily.      Cyanocobalamin (VITAMIN B12) 3000 MCG SUBL Place 1 tablet under the tongue daily.     lisinopril (ZESTRIL) 40 MG tablet  TAKE 1 TABLET BY MOUTH DAILY. MAY DECREASE TO 1/2 TABLET BY MOUTH PER DAY AS INDICATED 90 tablet 1   meclizine (ANTIVERT) 25 MG tablet 1/2 to 1  po up to every 8 hours as needed for vertigo  Fall precautions (Patient taking differently: 1/2 to 1  po up to every 8 hours as needed for vertigo  Fall precautions) 20 tablet 0   Omega-3 Fatty Acids (FISH OIL) 500 MG CAPS Take 500 mg by mouth 3 (three) times a week.     UNABLE TO FIND Med Name: [place] leg cramps ho meo pathix med  Takes as needed for leg cramps says it works well     No facility-administered medications prior to visit.     EXAM:  BP 130/80 (BP Location: Right Arm, Patient Position: Sitting, Cuff Size: Normal)   Pulse 77   Temp (!) 96.6 F (35.9 C) (Temporal)   Ht 5\' 3"  (1.6 m)   Wt 167 lb 6.4 oz (75.9 kg)   SpO2 97%   BMI 29.65 kg/m   Body mass index is 29.65 kg/m.  GENERAL: vitals reviewed and listed above, alert, oriented, appears well hydrated and in no acute distress HEENT: atraumatic, conjunctiva  clear, no obvious abnormalities on inspection of external nose and ears OP : Masked  NECK: no obvious masses on inspection palpation  Ambulatory unassisted slightly wide-based.  Younger than stated age. Skin  very dry and scaly mid calf and down   and dorsal surfaces. Red patches   and bumps antecubital area  and scattered on back  above waist.  No hives vesicles or burrows    MS: moves all extremities without noticeable focal  abnormality PSYCH: pleasant and cooperative, no obvious depression or anxiety Lab Results  Component Value Date   WBC 7.8 05/21/2020   HGB 15.1 (H) 05/21/2020   HCT 44.7 05/21/2020   PLT 236.0 05/21/2020   GLUCOSE 122 (H) 05/21/2020   CHOL 187 05/21/2020   TRIG 176.0 (H) 05/21/2020   HDL 35.30 (L) 05/21/2020   LDLDIRECT 103.0 07/10/2019   LDLCALC 117 (H) 05/21/2020   ALT 21 05/21/2020   AST 25 05/21/2020   NA 140 05/21/2020   K 4.6 05/21/2020   CL 107 05/21/2020   CREATININE 0.88  05/21/2020  BUN 25 (H) 05/21/2020   CO2 24 05/21/2020   TSH 2.71 05/21/2020   INR 0.93 04/01/2018   HGBA1C 6.2 05/21/2020   BP Readings from Last 3 Encounters:  09/09/20 130/80  07/30/20 132/78  05/21/20 130/70    ASSESSMENT AND PLAN:  Discussed the following assessment and plan:  Pruritus  Rash  Dry skin Moisturization ;  disc scratch itch cycle ; skin  hydration and topical steroids; oatmeal   aveeno . Give  update  in a week   prefers no dem at this time.  Caution with hydroxizine  Consider recheck cbc  etc if ongoing  -Patient advised to return or notify health care team  if  new concerns arise.  Patient Instructions  You have very dry skin with can add  to  the itching.  Please moisturize   every day  after bathing  while skin hydrated .   Aveeno  may help itching .   Can add   topical steroid.  Also .  Do not use topical benadryl.   If ongoing we may need to get dermatology involved.    Rash, Adult A rash is a change in the color of your skin. A rash can also change the way your skin feels. There are many different conditions and factors that can cause a rash. Some rashes may disappear after a few days, but some may last for a few weeks. Common causes of rashes include: Viral infections, such as: Colds. Measles. Hand, foot, and mouth disease. Bacterial infections, such as: Scarlet fever. Impetigo. Fungal infections, such as Candida. Allergic reactions to food, medicines, or skin care products. Follow these instructions at home: The goal of treatment is to stop the itching and keep the rash from spreading. Pay attention to any changes in your symptoms. Follow these instructions tohelp with your condition: Medicine Take or apply over-the-counter and prescription medicines only as told by your health care provider. These may include: Corticosteroid creams to treat red or swollen skin. Anti-itch lotions. Oral allergy medicines (antihistamines). Oral  corticosteroids for severe symptoms.  Skin care Apply cool compresses to the affected areas. Do not scratch or rub your skin. Avoid covering the rash. Make sure the rash is exposed to air as much as possible. Managing itching and discomfort Avoid hot showers or baths, which can make itching worse. A cold shower may help. Try taking a bath with: Epsom salts. Follow manufacturer instructions on the packaging. You can get these at your local pharmacy or grocery store. Baking soda. Pour a small amount into the bath as told by your health care provider. Colloidal oatmeal. Follow manufacturer instructions on the packaging. You can get this at your local pharmacy or grocery store. Try applying baking soda paste to your skin. Stir water into baking soda until it reaches a paste-like consistency. Try applying calamine lotion. This is an over-the-counter lotion that helps to relieve itchiness. Keep cool and out of the sun. Sweating and being hot can make itching worse. General instructions  Rest as needed. Drink enough fluid to keep your urine pale yellow. Wear loose-fitting clothing. Avoid scented soaps, detergents, and perfumes. Use gentle soaps, detergents, perfumes, and other cosmetic products. Avoid any substance that causes your rash. Keep a journal to help track what causes your rash. Write down: What you eat. What cosmetic products you use. What you drink. What you wear. This includes jewelry. Keep all follow-up visits as told by your health care provider. This is important.  Contact  a health care provider if: You sweat at night. You lose weight. You urinate more than normal. You urinate less than normal, or you notice that your urine is a darker color than usual. You feel weak. You vomit. Your skin or the whites of your eyes look yellow (jaundice). Your skin: Tingles. Is numb. Your rash: Does not go away after several days. Gets worse. You are: Unusually thirsty. More tired  than normal. You have: New symptoms. Pain in your abdomen. A fever. Diarrhea. Get help right away if you: Have a fever and your symptoms suddenly get worse. Develop confusion. Have a severe headache or a stiff neck. Have severe joint pains or stiffness. Have a seizure. Develop a rash that covers all or most of your body. The rash may or may not be painful. Develop blisters that: Are on top of the rash. Grow larger or grow together. Are painful. Are inside your nose or mouth. Develop a rash that: Looks like purple pinprick-sized spots all over your body. Has a "bull's eye" or looks like a target. Is not related to sun exposure, is red and painful, and causes your skin to peel. Summary A rash is a change in the color of your skin. Some rashes disappear after a few days, but some may last for a few weeks. The goal of treatment is to stop the itching and keep the rash from spreading. Take or apply over-the-counter and prescription medicines only as told by your health care provider. Contact a health care provider if you have new or worsening symptoms. Keep all follow-up visits as told by your health care provider. This is important. This information is not intended to replace advice given to you by your health care provider. Make sure you discuss any questions you have with your healthcare provider. Document Revised: 06/10/2018 Document Reviewed: 09/20/2017 Elsevier Patient Education  2022 [place]. Anthony Roland M.D.

## 2020-09-09 ENCOUNTER — Other Ambulatory Visit: Payer: Self-pay

## 2020-09-09 ENCOUNTER — Ambulatory Visit (INDEPENDENT_AMBULATORY_CARE_PROVIDER_SITE_OTHER): Payer: Medicare Other | Admitting: Internal Medicine

## 2020-09-09 ENCOUNTER — Encounter: Payer: Self-pay | Admitting: Internal Medicine

## 2020-09-09 VITALS — BP 130/80 | HR 77 | Temp 96.6°F | Ht 63.0 in | Wt 167.4 lb

## 2020-09-09 DIAGNOSIS — L299 Pruritus, unspecified: Secondary | ICD-10-CM

## 2020-09-09 DIAGNOSIS — L853 Xerosis cutis: Secondary | ICD-10-CM

## 2020-09-09 DIAGNOSIS — R21 Rash and other nonspecific skin eruption: Secondary | ICD-10-CM | POA: Diagnosis not present

## 2020-09-09 MED ORDER — TRIAMCINOLONE ACETONIDE 0.1 % EX CREA: 1.0000 "application " | TOPICAL_CREAM | Freq: Two times a day (BID) | CUTANEOUS | 1 refills | Status: DC

## 2020-09-09 MED ORDER — HYDROXYZINE HCL 10 MG PO TABS: 10.0000 mg | ORAL_TABLET | Freq: Three times a day (TID) | ORAL | 0 refills | Status: DC | PRN

## 2020-09-09 NOTE — Patient Instructions (Addendum)
You have very dry skin with can add  to  the itching.  Please moisturize   every day  after bathing  while skin hydrated .   Aveeno  may help itching .   Can add   topical steroid.  Also .  Do not use topical benadryl.   If ongoing we may need to get dermatology involved.    Rash, Adult A rash is a change in the color of your skin. A rash can also change the way your skin feels. There are many different conditions and factors that can cause a rash. Some rashes may disappear after a few days, but some may last for a few weeks. Common causes of rashes include: Viral infections, such as: Colds. Measles. Hand, foot, and mouth disease. Bacterial infections, such as: Scarlet fever. Impetigo. Fungal infections, such as Candida. Allergic reactions to food, medicines, or skin care products. Follow these instructions at home: The goal of treatment is to stop the itching and keep the rash from spreading. Pay attention to any changes in your symptoms. Follow these instructions tohelp with your condition: Medicine Take or apply over-the-counter and prescription medicines only as told by your health care provider. These may include: Corticosteroid creams to treat red or swollen skin. Anti-itch lotions. Oral allergy medicines (antihistamines). Oral corticosteroids for severe symptoms.  Skin care Apply cool compresses to the affected areas. Do not scratch or rub your skin. Avoid covering the rash. Make sure the rash is exposed to air as much as possible. Managing itching and discomfort Avoid hot showers or baths, which can make itching worse. A cold shower may help. Try taking a bath with: Epsom salts. Follow manufacturer instructions on the packaging. You can get these at your local pharmacy or grocery store. Baking soda. Pour a small amount into the bath as told by your health care provider. Colloidal oatmeal. Follow manufacturer instructions on the packaging. You can get this at your  local pharmacy or grocery store. Try applying baking soda paste to your skin. Stir water into baking soda until it reaches a paste-like consistency. Try applying calamine lotion. This is an over-the-counter lotion that helps to relieve itchiness. Keep cool and out of the sun. Sweating and being hot can make itching worse. General instructions  Rest as needed. Drink enough fluid to keep your urine pale yellow. Wear loose-fitting clothing. Avoid scented soaps, detergents, and perfumes. Use gentle soaps, detergents, perfumes, and other cosmetic products. Avoid any substance that causes your rash. Keep a journal to help track what causes your rash. Write down: What you eat. What cosmetic products you use. What you drink. What you wear. This includes jewelry. Keep all follow-up visits as told by your health care provider. This is important.  Contact a health care provider if: You sweat at night. You lose weight. You urinate more than normal. You urinate less than normal, or you notice that your urine is a darker color than usual. You feel weak. You vomit. Your skin or the whites of your eyes look yellow (jaundice). Your skin: Tingles. Is numb. Your rash: Does not go away after several days. Gets worse. You are: Unusually thirsty. More tired than normal. You have: New symptoms. Pain in your abdomen. A fever. Diarrhea. Get help right away if you: Have a fever and your symptoms suddenly get worse. Develop confusion. Have a severe headache or a stiff neck. Have severe joint pains or stiffness. Have a seizure. Develop a rash that covers all or most  of your body. The rash may or may not be painful. Develop blisters that: Are on top of the rash. Grow larger or grow together. Are painful. Are inside your nose or mouth. Develop a rash that: Looks like purple pinprick-sized spots all over your body. Has a "bull's eye" or looks like a target. Is not related to sun exposure, is  red and painful, and causes your skin to peel. Summary A rash is a change in the color of your skin. Some rashes disappear after a few days, but some may last for a few weeks. The goal of treatment is to stop the itching and keep the rash from spreading. Take or apply over-the-counter and prescription medicines only as told by your health care provider. Contact a health care provider if you have new or worsening symptoms. Keep all follow-up visits as told by your health care provider. This is important. This information is not intended to replace advice given to you by your health care provider. Make sure you discuss any questions you have with your healthcare provider. Document Revised: 06/10/2018 Document Reviewed: 09/20/2017 Elsevier Patient Education  2022 [employer].

## 2020-09-24 DIAGNOSIS — R21 Rash and other nonspecific skin eruption: Secondary | ICD-10-CM

## 2020-09-26 NOTE — Telephone Encounter (Signed)
Sorry that she is not feeling better yet she should be feeling better. Yes I would like her to see dermatologist Please put order in for dermatology ASAP referral   Can you send in an updated picture of the rash.

## 2020-09-26 NOTE — Telephone Encounter (Signed)
Urgent referral to dermatology has been placed.

## 2020-10-15 ENCOUNTER — Telehealth: Payer: Self-pay | Admitting: Pharmacist

## 2020-10-15 NOTE — Chronic Care Management (AMB) (Signed)
    Chronic Care Management Pharmacy Assistant   Name: Julie Boone  MRN: [id number] DOB: 10/21/1924  Reason for Encounter: General Adherence Call   Recent office visits:  7-11-202 Burnis Medin, MD - Patient was seen for rash and other concerns. Prescribed Hydroxyzine 10-20 mg PRN and Triamcinolone cream.  Recent consult visits:  None  Hospital visits:  None in previous 6 months  Medications: Outpatient Encounter Medications as of 10/15/2020  Medication Sig   apixaban (ELIQUIS) 5 MG TABS tablet Take 1 tablet (5 mg total) by mouth 2 (two) times daily.   atenolol (TENORMIN) 50 MG tablet TAKE 1 TABLET (50 MG TOTAL) BY MOUTH DAILY. TAKE 1 AND 1/2 TABLETS EVERY DAY   Cholecalciferol (VITAMIN D-3) 125 MCG (5000 UT) TABS Take 1 tablet by mouth daily.    Cyanocobalamin (VITAMIN B12) 3000 MCG SUBL Place 1 tablet under the tongue daily.   hydrOXYzine (ATARAX/VISTARIL) 10 MG tablet Take 1-2 tablets (10-20 mg total) by mouth 3 (three) times daily as needed for itching.   lisinopril (ZESTRIL) 40 MG tablet TAKE 1 TABLET BY MOUTH DAILY. MAY DECREASE TO 1/2 TABLET BY MOUTH PER DAY AS INDICATED   meclizine (ANTIVERT) 25 MG tablet 1/2 to 1  po up to every 8 hours as needed for vertigo  Fall precautions (Patient taking differently: 1/2 to 1  po up to every 8 hours as needed for vertigo  Fall precautions)   Omega-3 Fatty Acids (FISH OIL) 500 MG CAPS Take 500 mg by mouth 3 (three) times a week.   triamcinolone cream (KENALOG) 0.1 % Apply 1 application topically 2 (two) times daily. To rash   UNABLE TO FIND Med Name: [place] leg cramps ho meo pathix med  Takes as needed for leg cramps says it works well   No facility-administered encounter medications on file as of 10/15/2020.  Notes: Call to patient she reports she is having no issues at this time, she reports no side effects from her medications as far as she can tell. She reports she does not need any refills on any of her medications at  this time. She reports when she wakes up she has been feeling good and she is happy about it. She reports she has difficulty hearing on the phone and was not interested in scheduling a follow up appointment with the Pharmacist via phone call or in person at this time.  Care Gaps: TDAP - Overdue Zoster Vaccine - Overdue Mammogram - Overdue COVID Booster #4 Therapist, music) - Overdue Flu Vaccine - Overdue AWV - Scheduled 08-05-2021 [facility] F/U Call - Declined in person as well  Star Rating Drugs: Lisinopril (Zestril) 40 mg - Last filled 07-22-2020 90 DS at [place] Pharmacist Assistant [phone]

## 2020-11-26 ENCOUNTER — Other Ambulatory Visit: Payer: Self-pay

## 2020-11-26 ENCOUNTER — Ambulatory Visit (INDEPENDENT_AMBULATORY_CARE_PROVIDER_SITE_OTHER): Payer: Medicare Other | Admitting: Otolaryngology

## 2020-11-26 DIAGNOSIS — H903 Sensorineural hearing loss, bilateral: Secondary | ICD-10-CM | POA: Diagnosis not present

## 2020-11-26 DIAGNOSIS — H6123 Impacted cerumen, bilateral: Secondary | ICD-10-CM

## 2020-11-26 NOTE — Progress Notes (Signed)
HPI: Julie Boone is a 87 y.o. female who presents for evaluation of wax buildup in her ears.  She wears bilateral hearing aids.  She was last cleaned about 4 to 5 months ago..  Past Medical History:  Diagnosis Date   Acute sinusitis with symptoms greater than 10 days 06/23/2013   Arthritis    Breast cancer ([place])    mastectomy 3   neg ln    HOH (hard of hearing)    HTN (hypertension)    Wears glasses    Wears hearing aid    both ears   Past Surgical History:  Procedure Laterality Date   APPENDECTOMY  2006   BREAST BIOPSY Left 2012   COLONOSCOPY     HERNIA REPAIR     INSERTION OF MESH N/A 09/05/2013   Procedure: INSERTION OF MESH;  Surgeon: Joyice Faster. Cornett, MD;  Location: [facility];  Service: General;  Laterality: N/A;   MASTECTOMY Left 2013   TONSILLECTOMY AND ADENOIDECTOMY     Childhood   UMBILICAL HERNIA REPAIR N/A 09/05/2013   Procedure: HERNIA REPAIR UMBILICAL ADULT WITH MESH;  Surgeon: Joyice Faster. Cornett, MD;  Location: [facility];  Service: General;  Laterality: N/A;   Social History   Socioeconomic History   Marital status: Widowed    Spouse name: Not on file   Number of children: 1   Years of education: 12   Highest education level: High school graduate  Occupational History    Comment: retired  Tobacco Use   Smoking status: Never   Smokeless tobacco: Never  Vaping Use   Vaping Use: Never used  Substance and Sexual Activity   Alcohol use: Yes    Alcohol/week: 0.0 standard drinks    Comment: 1 time a month. has a sip ever now and then    Drug use: No   Sexual activity: Not on file  Other Topics Concern   Not on file  Social History Narrative   2 people living in the home   Dog Small yorkie and [place]   From [place] moved from [place]  Adopted daughter nearby.      Neg ets  Wine with meals  No tob rd.   G0P0   Married husband [place] background  Herself [place] descent.   Husband passed CHF and prostate cancer 2017    Now living with daughter         Social Determinants of Health   Financial Resource Strain: Low Risk    Difficulty of Paying Living Expenses: Not hard at all  Food Insecurity: No Food Insecurity   Worried About Charity fundraiser in the Last Year: Never true   Arboriculturist in the Last Year: Never true  Transportation Needs: No Transportation Needs   Lack of Transportation (Medical): No   Lack of Transportation (Non-Medical): No  Physical Activity: Inactive   Days of Exercise per Week: 0 days   Minutes of Exercise per Session: 0 min  Stress: Stress Concern Present   Feeling of Stress : To some extent  Social Connections: Moderately Isolated   Frequency of Communication with Friends and Family: More than three times a week   Frequency of Social Gatherings with Friends and Family: Twice a week   Attends Religious Services: 1 to 4 times per year   Active Member of [employer] or Organizations: No   Attends Archivist Meetings: Never   Marital Status: Widowed   Family History  Problem Relation Age of Onset   Hypertension Mother    Stroke Mother    Heart failure Mother    Lung disease Sister        smoker   Colon cancer Father        dx in his late 63's   Esophageal cancer Neg Hx    Inflammatory bowel disease Neg Hx    Liver disease Neg Hx    Pancreatic cancer Neg Hx    Stomach cancer Neg Hx    Allergies  Allergen Reactions   Shellfish Allergy Anaphylaxis and Swelling   Morphine And Related Nausea And Vomiting   Prior to Admission medications   Medication Sig Start Date End Date Taking? Authorizing Provider  apixaban (ELIQUIS) 5 MG TABS tablet Take 1 tablet (5 mg total) by mouth 2 (two) times daily. 01/22/20   Elouise Munroe, MD  atenolol (TENORMIN) 50 MG tablet TAKE 1 TABLET (50 MG TOTAL) BY MOUTH DAILY. TAKE 1 AND 1/2 TABLETS EVERY DAY 08/12/20   Panosh, Standley Brooking, MD  Cholecalciferol (VITAMIN D-3) 125 MCG (5000 UT) TABS Take 1 tablet by mouth daily.      [provider]  Cyanocobalamin (VITAMIN B12) 3000 MCG SUBL Place 1 tablet under the tongue daily.    [provider]  hydrOXYzine (ATARAX/VISTARIL) 10 MG tablet Take 1-2 tablets (10-20 mg total) by mouth 3 (three) times daily as needed for itching. 09/09/20   Panosh, Standley Brooking, MD  lisinopril (ZESTRIL) 40 MG tablet TAKE 1 TABLET BY MOUTH DAILY. MAY DECREASE TO 1/2 TABLET BY MOUTH PER DAY AS INDICATED 07/22/20   Panosh, Standley Brooking, MD  meclizine (ANTIVERT) 25 MG tablet 1/2 to 1  po up to every 8 hours as needed for vertigo  Fall precautions Patient taking differently: 1/2 to 1  po up to every 8 hours as needed for vertigo  Fall precautions 06/13/19   Panosh, Standley Brooking, MD  Omega-3 Fatty Acids (FISH OIL) 500 MG CAPS Take 500 mg by mouth 3 (three) times a week.    [provider]  triamcinolone cream (KENALOG) 0.1 % Apply 1 application topically 2 (two) times daily. To rash 09/09/20   Panosh, Standley Brooking, MD  UNABLE TO FIND Med Name: [facility] leg cramps ho meo pathix med  Takes as needed for leg cramps says it works well    [provider]     Positive ROS: Otherwise negative  All other systems have been reviewed and were otherwise negative with the exception of those mentioned in the HPI and as above.  Physical Exam: Constitutional: Alert, well-appearing, no acute distress Ears: External ears without lesions or tenderness. Ear canals mod amount of wax in both ear canals that was cleaned with forceps and curettes.  TMs were clear bilaterally.. Nasal: External nose without lesions. Clear nasal passages Oral: Oropharynx clear. Neck: No palpable adenopathy or masses Respiratory: Breathing comfortably  Skin: No facial/neck lesions or rash noted.  Cerumen impaction removal  Date/Time: 11/26/2020 2:19 PM Performed by: [name], MD Authorized by: [name], MD   Consent:    Consent obtained:  Verbal   Consent given by:  Patient   Risks  discussed:  Pain and bleeding Procedure details:    Location:  L ear and R ear   Procedure type: curette and forceps   Post-procedure details:    Inspection:  TM intact and canal normal   Hearing quality:  Improved   Procedure completion:  Tolerated well,  no immediate complications Comments:     TMs are clear bilaterally  Assessment: Cerumen buildup and patient wearing hearing aids.  Plan: This was cleaned in the office. Reviewed with patient concerning my retirement is suggested follow-up with one of the other ENT groups in town in 5 to 6 months.  Radene Journey, MD

## 2020-12-03 ENCOUNTER — Other Ambulatory Visit: Payer: Self-pay

## 2020-12-03 ENCOUNTER — Ambulatory Visit (INDEPENDENT_AMBULATORY_CARE_PROVIDER_SITE_OTHER): Payer: Medicare Other | Admitting: Adult Health

## 2020-12-03 ENCOUNTER — Encounter: Payer: Self-pay | Admitting: Adult Health

## 2020-12-03 VITALS — BP 120/78 | HR 70 | Temp 98.1°F

## 2020-12-03 DIAGNOSIS — M25562 Pain in left knee: Secondary | ICD-10-CM | POA: Diagnosis not present

## 2020-12-03 NOTE — Patient Instructions (Signed)
It was great meeting you today   You are scheduled for an ultrasound  tomorrow morning at @8 :45 am at [facility], [place], [place] [address] Phone: [phone]

## 2020-12-03 NOTE — Progress Notes (Signed)
Subjective:    Patient ID: Julie Boone, female    DOB: [birth year], 87 y.o.   MRN: [id number]  HPI  87 year old female who  has a past medical history of Acute sinusitis with symptoms greater than 10 days (06/23/2013), Arthritis, Breast cancer ([place]), HOH (hard of hearing), HTN (hypertension), Wears glasses, and Wears hearing aid.  She is a patient of Dr. Regis Bill who I am seeing today for acute pain in the back of her left knee. She is with her daughter today   She has a history of DVT behind the right knee multiple years ago.   She reports that about a week ago she developed sudden pain behind the left knee with some swelling noted. She is usually ambulatory but is unable to walk over the last week do to pain. Has been using tylenol without relief.   Denies chest pain, SOB, or long rides in a car.   Review of Systems See HPI   Past Medical History:  Diagnosis Date   Acute sinusitis with symptoms greater than 10 days 06/23/2013   Arthritis    Breast cancer ([place])    mastectomy 3   neg ln    HOH (hard of hearing)    HTN (hypertension)    Wears glasses    Wears hearing aid    both ears    Social History   Socioeconomic History   Marital status: Widowed    Spouse name: Not on file   Number of children: 1   Years of education: 12   Highest education level: High school graduate  Occupational History    Comment: retired  Tobacco Use   Smoking status: Never   Smokeless tobacco: Never  Vaping Use   Vaping Use: Never used  Substance and Sexual Activity   Alcohol use: Yes    Alcohol/week: 0.0 standard drinks    Comment: 1 time a month. has a sip ever now and then    Drug use: No   Sexual activity: Not on file  Other Topics Concern   Not on file  Social History Narrative   2 people living in the home   Dog Small yorkie and [place]   From [place] moved from [place]  Adopted daughter nearby.      Neg ets  Wine with meals  No tob rd.   G0P0   Married husband [place]  background  H[place]elf [place] descent.   Husband passed CHF and prostate cancer 2017   Now living with daughter         Social Determinants of Health   Financial Resource Strain: Low Risk    Difficulty of Paying Living Expenses: Not hard at all  Food Insecurity: No Food Insecurity   Worried About Charity fundraiser in the Last Year: Never true   Arboriculturist in the Last Year: Never true  Transportation Needs: No Transportation Needs   Lack of Transportation (Medical): No   Lack of Transportation (Non-Medical): No  Physical Activity: Inactive   Days of Exercise per Week: 0 days   Minutes of Exercise per Session: 0 min  Stress: Stress Concern Present   Feeling of Stress : To some extent  Social Connections: Moderately Isolated   Frequency of Communication with Friends and Family: More than three times a week   Frequency of Social Gatherings with Friends and Family: Twice a week   Attends Religious Services: 1 to 4 times per year   Active Member  of Clubs or Organizations: No   Attends Archivist Meetings: Never   Marital Status: Widowed  Intimate Partner Violence: Not At Risk   Fear of Current or Ex-Partner: No   Emotionally Abused: No   Physically Abused: No   Sexually Abused: No    Past Surgical History:  Procedure Laterality Date   APPENDECTOMY  2006   BREAST BIOPSY Left 2012   COLONOSCOPY     HERNIA REPAIR     INSERTION OF MESH N/A 09/05/2013   Procedure: INSERTION OF MESH;  Surgeon: Joyice Faster. Cornett, MD;  Location: [facility];  Service: General;  Laterality: N/A;   MASTECTOMY Left 2013   TONSILLECTOMY AND ADENOIDECTOMY     Childhood   UMBILICAL HERNIA REPAIR N/A 09/05/2013   Procedure: HERNIA REPAIR UMBILICAL ADULT WITH MESH;  Surgeon: Joyice Faster. Cornett, MD;  Location: [facility];  Service: General;  Laterality: N/A;    Family History  Problem Relation Age of Onset   Hypertension Mother    Stroke Mother    Heart failure  Mother    Lung disease Sister        smoker   Colon cancer Father        dx in his late 42's   Esophageal cancer Neg Hx    Inflammatory bowel disease Neg Hx    Liver disease Neg Hx    Pancreatic cancer Neg Hx    Stomach cancer Neg Hx     Allergies  Allergen Reactions   Shellfish Allergy Anaphylaxis and Swelling   Morphine And Related Nausea And Vomiting    Current Outpatient Medications on File Prior to Visit  Medication Sig Dispense Refill   apixaban (ELIQUIS) 5 MG TABS tablet Take 1 tablet (5 mg total) by mouth 2 (two) times daily. 180 tablet 3   atenolol (TENORMIN) 50 MG tablet TAKE 1 TABLET (50 MG TOTAL) BY MOUTH DAILY. TAKE 1 AND 1/2 TABLETS EVERY DAY 135 tablet 1   Cholecalciferol (VITAMIN D-3) 125 MCG (5000 UT) TABS Take 1 tablet by mouth daily.      Cyanocobalamin (VITAMIN B12) 3000 MCG SUBL Place 1 tablet under the tongue daily.     hydrOXYzine (ATARAX/VISTARIL) 10 MG tablet Take 1-2 tablets (10-20 mg total) by mouth 3 (three) times daily as needed for itching. 40 tablet 0   lisinopril (ZESTRIL) 40 MG tablet TAKE 1 TABLET BY MOUTH DAILY. MAY DECREASE TO 1/2 TABLET BY MOUTH PER DAY AS INDICATED 90 tablet 1   meclizine (ANTIVERT) 25 MG tablet 1/2 to 1  po up to every 8 hours as needed for vertigo  Fall precautions (Patient taking differently: 1/2 to 1  po up to every 8 hours as needed for vertigo  Fall precautions) 20 tablet 0   Omega-3 Fatty Acids (FISH OIL) 500 MG CAPS Take 500 mg by mouth 3 (three) times a week.     triamcinolone cream (KENALOG) 0.1 % Apply 1 application topically 2 (two) times daily. To rash 60 g 1   UNABLE TO FIND Med Name: [place] leg cramps ho meo pathix med  Takes as needed for leg cramps says it works well     No current facility-administered medications on file prior to visit.    BP 120/78   Pulse 70   Temp 98.1 F (36.7 C)       Objective:   Physical Exam Vitals and nursing note reviewed.  Constitutional:      Appearance: Normal  appearance.  Cardiovascular:     Rate and Rhythm: Normal rate and regular rhythm.     Pulses: Normal pulses.     Heart sounds: Normal heart sounds.  Pulmonary:     Effort: Pulmonary effort is normal.     Breath sounds: Normal breath sounds.  Musculoskeletal:     Left knee: Decreased range of motion. Tenderness present.       Legs:  Skin:    General: Skin is warm and dry.  Neurological:     General: No focal deficit present.     Mental Status: She is alert and oriented to person, place, and time.     Comments: In wheelchair for exam   Psychiatric:        Mood and Affect: Mood normal.        Behavior: Behavior normal.        Thought Content: Thought content normal.      Assessment & Plan:  1. Acute pain of left knee -DVT versus Baker's cyst.  With her history we need to rule out DVT.  If Baker's cyst shows on ultrasound then we will have her come back to the office for intra-articular steroid injection. - VAS [place] LOWER EXTREMITY VENOUS (DVT); Future  [name], NP

## 2020-12-04 ENCOUNTER — Telehealth: Payer: Self-pay | Admitting: Adult Health

## 2020-12-04 ENCOUNTER — Encounter: Payer: Self-pay | Admitting: Adult Health

## 2020-12-04 ENCOUNTER — Ambulatory Visit (HOSPITAL_COMMUNITY)
Admission: RE | Admit: 2020-12-04 | Discharge: 2020-12-04 | Disposition: A | Discharge status: Home / Self Care | Payer: Medicare Other | Source: Ambulatory Visit | Attending: Adult Health | Admitting: Adult Health

## 2020-12-04 DIAGNOSIS — M25562 Pain in left knee: Secondary | ICD-10-CM | POA: Insufficient documentation

## 2020-12-04 DIAGNOSIS — I82532 Chronic embolism and thrombosis of left popliteal vein: Secondary | ICD-10-CM

## 2020-12-04 MED ORDER — TRAMADOL HCL 50 MG PO TABS: 50.0000 mg | ORAL_TABLET | Freq: Three times a day (TID) | ORAL | 0 refills | Status: AC | PRN

## 2020-12-04 NOTE — Telephone Encounter (Signed)
This has been taking care of. Pt wants to know if she should still be wearing her compression stockings. I advised that Tommi Rumps was out of the office but he will answer when he returns. Pt daughter verbalized understanding.

## 2020-12-04 NOTE — Telephone Encounter (Signed)
Updated patient and her daughter on results of the ultrasound of left lower extremity which showed  - Findings consistent with chronic deep vein thrombosis involving the left popliteal vein.  Reports that she is in significant pain, will send in short course of tramadol.  She was advised that this may make her sleepy so to try it at night first.  We will also refer her over to hematology for further evaluation.  Continue with Eliquis 5 mg twice daily

## 2020-12-05 ENCOUNTER — Telehealth: Payer: Self-pay | Admitting: Hematology and Oncology

## 2020-12-05 NOTE — Telephone Encounter (Signed)
Scheduled appt per 10/5 referral. Pt's daughter is aware of appt date and time.

## 2020-12-06 ENCOUNTER — Other Ambulatory Visit: Payer: Medicare Other

## 2020-12-06 ENCOUNTER — Encounter: Payer: Medicare Other | Admitting: Hematology and Oncology

## 2020-12-09 ENCOUNTER — Encounter: Payer: Self-pay | Admitting: Adult Health

## 2020-12-09 ENCOUNTER — Inpatient Hospital Stay: Payer: Medicare Other | Attending: Hematology and Oncology | Admitting: Hematology and Oncology

## 2020-12-09 ENCOUNTER — Inpatient Hospital Stay: Payer: Medicare Other

## 2020-12-09 ENCOUNTER — Other Ambulatory Visit: Payer: Self-pay

## 2020-12-09 ENCOUNTER — Encounter: Payer: Self-pay | Admitting: Hematology and Oncology

## 2020-12-09 VITALS — BP 135/84 | HR 57 | Temp 97.5°F | Resp 18

## 2020-12-09 DIAGNOSIS — Z808 Family history of malignant neoplasm of other organs or systems: Secondary | ICD-10-CM | POA: Diagnosis not present

## 2020-12-09 DIAGNOSIS — Z86718 Personal history of other venous thrombosis and embolism: Secondary | ICD-10-CM

## 2020-12-09 DIAGNOSIS — I10 Essential (primary) hypertension: Secondary | ICD-10-CM

## 2020-12-09 DIAGNOSIS — Z853 Personal history of malignant neoplasm of breast: Secondary | ICD-10-CM | POA: Diagnosis not present

## 2020-12-09 DIAGNOSIS — I82532 Chronic embolism and thrombosis of left popliteal vein: Secondary | ICD-10-CM | POA: Diagnosis not present

## 2020-12-09 LAB — ANTITHROMBIN III: AntiThromb III Func: 84 % (ref 75–120)

## 2020-12-09 MED ORDER — ENOXAPARIN SODIUM 100 MG/ML IJ SOSY: 100.0000 mg | PREFILLED_SYRINGE | INTRAMUSCULAR | 1 refills | Status: DC

## 2020-12-09 NOTE — Progress Notes (Signed)
[place] CONSULT NOTE  Patient Care Team: Panosh, Standley Brooking, MD as PCP - General (Internal Medicine) Elouise Munroe, MD as PCP - Cardiology (Cardiology) Erroll Luna, MD as Consulting Physician (General Surgery) Inocencio Homes, DPM as Consulting Physician (Podiatry) Earnie Larsson, [facility] as Pharmacist (Pharmacist)  CHIEF COMPLAINTS/PURPOSE OF CONSULTATION:  Chronic DVT while on anticoagulation  ASSESSMENT & PLAN:  This is a pleasant 87 year old female patient with past medical history of left breast DCIS status postmastectomy, right lower extremity DVT on anticoagulation who recently complained of severe pain behind the left knee, had a repeat ultrasound which showed a chronic left popliteal DVT. Besides the knee pain, she denies any worsening swelling or chest pain or shortness of breath.  She has been compliant with Eliquis.  We have discussed that it is quite unusual to clot while on Eliquis.  Physical examination, extremely anxious 87 year old female patient sitting in the chair with emesis bag.  Bilateral lower extremities appear symmetrical, chronic venous stasis changes. No palpable lymphadenopathy in the cervical or axilla but axillary exam limited by her sweater and her anxiety.  Chest was clear to auscultation bilaterally.  We have discussed about multiple options for anticoagulation since there is concern for Eliquis failure.  We have discussed about transitioning to Lovenox, she prefers once a day dosing at 1.5 mg/kg body weight versus transitioning to warfarin or another DOAC.  I have discussed that changing to another DOAC is not the most preferred way.  She is willing to try Lovenox at this time.  She understands the risks of bleeding with blood thinners.  I prescribed 100 mg once a day, approximately 1.5 mg/kg/day, rounded to the nearest dose for easier administration for the patient.  She will return to clinic in 4 weeks.  We have also discussed about  hypercoagulable work-up, this was ordered.  We have discussed that hypercoagulable work-up may not be very valuable given her age however since she was concerned about having a blood clot while on Eliquis, I think it is reasonable to proceed.  She will follow-up with one of my colleagues to review the lab results and to discuss any additional anticoagulation recommendations.  If her knee pain resolves, given her age and since she is very worried about taking long-term Lovenox, I think it is reasonable to consider another DOAC although this is not the most efficient method of anticoagulation when there is a failure within the same class. We do not have any concerns for active malignancy since her review of systems was not concerning for any other symptoms. Thank you for consulting [place] the care of this patient.  Please not hesitate to contact us with any additional questions or concerns.  Orders Placed This Encounter  Procedures   Lupus anticoagulant panel    Standing Status:   Future    Number of Occurrences:   1    Standing Expiration Date:   12/09/2021   Prothrombin gene mutation    Standing Status:   Future    Number of Occurrences:   1    Standing Expiration Date:   12/09/2021   Factor 5 leiden    Standing Status:   Future    Number of Occurrences:   1    Standing Expiration Date:   12/09/2021   Hexagonal Phospholipid Neutralization    Standing Status:   Future    Number of Occurrences:   1    Standing Expiration Date:   12/09/2021   Cardiolipin antibodies, IgG,  IgM, IgA    Standing Status:   Future    Number of Occurrences:   1    Standing Expiration Date:   12/09/2021   Antithrombin III    Standing Status:   Future    Number of Occurrences:   1    Standing Expiration Date:   12/09/2021   Protein C activity    Standing Status:   Future    Number of Occurrences:   1    Standing Expiration Date:   12/09/2021   Protein S activity    Standing Status:   Future    Number of  Occurrences:   1    Standing Expiration Date:   12/09/2021   Beta-2-glycoprotein i abs, IgG/M/A    Standing Status:   Future    Number of Occurrences:   1    Standing Expiration Date:   12/09/2021     HISTORY OF PRESENTING ILLNESS:  Julie Boone 87 y.o. female is here because of chronic DVT of left popliteal vein. Back in February 2020 she had findings consistent with acute DVT involving right common femoral vein, right popliteal vein, right posterior tibial vein, right peroneal vein and right gastrocnemius vein.  She arrived to the appointment today with her daughter.  She is hard of hearing and extremely anxious.  Daughter helps with history.  Back in 2020, she had a DVT of the right leg as well as a PE and was started on anticoagulation with Eliquis.  She denies any noncompliance.  Most recently about a week or week and a half ago she has noticed excruciating pain behind her left knee and has had another ultrasound which showed chronic DVT of the left popliteal vein.  She denies any worsening chest pain, shortness of breath at this time.  She has been taking Eliquis as prescribed, may have missed occasional doses.  Family history significant for DVT in sister and her mother.  Before 2020, she never had any episodes of DVT/PE despite surgeries and history of breast cancer.  She had left breast mastectomy for DCIS back in 2012.  Again she is very anxious, stressed out about coming to the doctor.  Besides having this left knee pain, she denies any other complaints.  She tells me that she is perfectly healthy for 87 year old.  Rest of the pertinent 10 point ROS reviewed and negative.  MEDICAL HISTORY:  Past Medical History:  Diagnosis Date   Acute sinusitis with symptoms greater than 10 days 06/23/2013   Arthritis    Breast cancer ([place])    mastectomy 3   neg ln    HOH (hard of hearing)    HTN (hypertension)    Wears glasses    Wears hearing aid    both ears    SURGICAL  HISTORY: Past Surgical History:  Procedure Laterality Date   APPENDECTOMY  2006   BREAST BIOPSY Left 2012   COLONOSCOPY     HERNIA REPAIR     INSERTION OF MESH N/A 09/05/2013   Procedure: INSERTION OF MESH;  Surgeon: Joyice Faster. Cornett, MD;  Location: [facility];  Service: General;  Laterality: N/A;   MASTECTOMY Left 2013   TONSILLECTOMY AND ADENOIDECTOMY     Childhood   UMBILICAL HERNIA REPAIR N/A 09/05/2013   Procedure: HERNIA REPAIR UMBILICAL ADULT WITH MESH;  Surgeon: Joyice Faster. Cornett, MD;  Location: [facility];  Service: General;  Laterality: N/A;    SOCIAL HISTORY: Social History   Socioeconomic  History   Marital status: Widowed    Spouse name: Not on file   Number of children: 1   Years of education: 12   Highest education level: High school graduate  Occupational History    Comment: retired  Tobacco Use   Smoking status: Never   Smokeless tobacco: Never  Vaping Use   Vaping Use: Never used  Substance and Sexual Activity   Alcohol use: Yes    Alcohol/week: 0.0 standard drinks    Comment: 1 time a month. has a sip ever now and then    Drug use: No   Sexual activity: Not on file  Other Topics Concern   Not on file  Social History Narrative   2 people living in the home   Dog Small yorkie and [place]   From [place] moved from [place]  Adopted daughter nearby.      Neg ets  Wine with meals  No tob rd.   G0P0   Married husband [place] background  Herself [place] descent.   Husband passed CHF and prostate cancer 2017   Now living with daughter         Social Determinants of Health   Financial Resource Strain: Low Risk    Difficulty of Paying Living Expenses: Not hard at all  Food Insecurity: No Food Insecurity   Worried About Charity fundraiser in the Last Year: Never true   Arboriculturist in the Last Year: Never true  Transportation Needs: No Transportation Needs   Lack of Transportation (Medical): No   Lack of Transportation  (Non-Medical): No  Physical Activity: Inactive   Days of Exercise per Week: 0 days   Minutes of Exercise per Session: 0 min  Stress: Stress Concern Present   Feeling of Stress : To some extent  Social Connections: Moderately Isolated   Frequency of Communication with Friends and Family: More than three times a week   Frequency of Social Gatherings with Friends and Family: Twice a week   Attends Religious Services: 1 to 4 times per year   Active Member of [employer] or Organizations: No   Attends Archivist Meetings: Never   Marital Status: Widowed  Human resources officer Violence: Not At Risk   Fear of Current or Ex-Partner: No   Emotionally Abused: No   Physically Abused: No   Sexually Abused: No    FAMILY HISTORY: Family History  Problem Relation Age of Onset   Hypertension Mother    Stroke Mother    Heart failure Mother    Lung disease Sister        smoker   Colon cancer Father        dx in his late 58's   Esophageal cancer Neg Hx    Inflammatory bowel disease Neg Hx    Liver disease Neg Hx    Pancreatic cancer Neg Hx    Stomach cancer Neg Hx     ALLERGIES:  is allergic to shellfish allergy and morphine and related.  MEDICATIONS:  Current Outpatient Medications  Medication Sig Dispense Refill   atenolol (TENORMIN) 50 MG tablet TAKE 1 TABLET (50 MG TOTAL) BY MOUTH DAILY. TAKE 1 AND 1/2 TABLETS EVERY DAY 135 tablet 1   Cholecalciferol (VITAMIN D-3) 125 MCG (5000 UT) TABS Take 1 tablet by mouth daily.      Cyanocobalamin (VITAMIN B12) 3000 MCG SUBL Place 1 tablet under the tongue daily.     enoxaparin (LOVENOX) 100 MG/ML injection Inject 1 mL (100  mg total) into the skin daily. 30 mL 1   hydrOXYzine (ATARAX/VISTARIL) 10 MG tablet Take 1-2 tablets (10-20 mg total) by mouth 3 (three) times daily as needed for itching. 40 tablet 0   lisinopril (ZESTRIL) 40 MG tablet TAKE 1 TABLET BY MOUTH DAILY. MAY DECREASE TO 1/2 TABLET BY MOUTH PER DAY AS INDICATED 90 tablet 1    meclizine (ANTIVERT) 25 MG tablet 1/2 to 1  po up to every 8 hours as needed for vertigo  Fall precautions (Patient taking differently: 1/2 to 1  po up to every 8 hours as needed for vertigo  Fall precautions) 20 tablet 0   Omega-3 Fatty Acids (FISH OIL) 500 MG CAPS Take 500 mg by mouth 3 (three) times a week.     traMADol (ULTRAM) 50 MG tablet Take 1 tablet (50 mg total) by mouth every 8 (eight) hours as needed for up to 5 days. 15 tablet 0   triamcinolone cream (KENALOG) 0.1 % Apply 1 application topically 2 (two) times daily. To rash 60 g 1   UNABLE TO FIND Med Name: [place] leg cramps ho meo pathix med  Takes as needed for leg cramps says it works well     No current facility-administered medications for this visit.   PHYSICAL EXAMINATION: ECOG PERFORMANCE STATUS: 0 - Asymptomatic  Vitals:   12/09/20 1044  BP: 135/84  Pulse: (!) 57  Resp: 18  Temp: (!) 97.5 F (36.4 C)  SpO2: 94%   Filed Weights    GENERAL:alert, no distress and comfortable SKIN: skin color, texture, turgor are normal, no rashes or significant lesions EYES: normal, conjunctiva are pink and non-injected, sclera clear OROPHARYNX:no exudate, no erythema and lips, buccal mucosa, and tongue normal  NECK: supple, thyroid normal size, non-tender, without nodularity LYMPH:  no palpable lymphadenopathy in the cervical, axillary or inguinal LUNGS: clear to auscultation and percussion with normal breathing effort HEART: regular rate & rhythm and no murmurs and no lower extremity edema ABDOMEN:abdomen soft, non-tender and normal bowel sounds Musculoskeletal:no cyanosis of digits and no clubbing  PSYCH: alert & oriented x 3 with fluent speech NEURO: no focal motor/sensory deficits  LABORATORY DATA:  I have reviewed the data as listed Lab Results  Component Value Date   WBC 7.8 05/21/2020   HGB 15.1 (H) 05/21/2020   HCT 44.7 05/21/2020   MCV 95.2 05/21/2020   PLT 236.0 05/21/2020     Chemistry      Component  Value Date/Time   NA 140 05/21/2020 1055   NA 144 04/25/2018 1138   K 4.6 05/21/2020 1055   CL 107 05/21/2020 1055   CO2 24 05/21/2020 1055   BUN 25 (H) 05/21/2020 1055   BUN 15 04/25/2018 1138   CREATININE 0.88 05/21/2020 1055      Component Value Date/Time   CALCIUM 9.6 05/21/2020 1055   ALKPHOS 74 05/21/2020 1055   AST 25 05/21/2020 1055   ALT 21 05/21/2020 1055   BILITOT 0.7 05/21/2020 1055       RADIOGRAPHIC STUDIES: I have personally reviewed the radiological images as listed and agreed with the findings in the report. VAS [place] LOWER EXTREMITY VENOUS (DVT)  Result Date: 12/04/2020  Lower Venous DVT Study Patient Name:  TING CAGE  Date of Exam:   12/04/2020 Medical Rec #: [id number]             Accession #:    [id number] Date of Birth: [birth year]  Patient Gender: F Patient Age:   73 years Exam Location:  [facility] Procedure:      VAS [place] LOWER EXTREMITY VENOUS (DVT) Referring Phys: Tommi Rumps NAFZIGER --------------------------------------------------------------------------------  Indications: Discomfort involving the left popliteal fossa with rash of the lower leg. History of right leg DVT with PE 04/05/2018  Limitations: Patient mobility. Performing Technologist: Ronal Fear RVS, RCS  Examination Guidelines: A complete evaluation includes B-mode imaging, spectral Doppler, color Doppler, and power Doppler as needed of all accessible portions of each vessel. Bilateral testing is considered an integral part of a complete examination. Limited examinations for reoccurring indications may be performed as noted. The reflux portion of the exam is performed with the patient in reverse Trendelenburg.  +---------+---------------+---------+-----------+----------+--------------+ LEFT     CompressibilityPhasicitySpontaneityPropertiesThrombus Aging +---------+---------------+---------+-----------+----------+--------------+ CFV      Full                                                         +---------+---------------+---------+-----------+----------+--------------+ SFJ      Full                                                        +---------+---------------+---------+-----------+----------+--------------+ FV Prox  Full                                                        +---------+---------------+---------+-----------+----------+--------------+ FV Mid   Full                                                        +---------+---------------+---------+-----------+----------+--------------+ FV DistalFull                                                        +---------+---------------+---------+-----------+----------+--------------+ POP      Partial                                      Chronic        +---------+---------------+---------+-----------+----------+--------------+ PTV      Full                                                        +---------+---------------+---------+-----------+----------+--------------+ PERO     Full                                                        +---------+---------------+---------+-----------+----------+--------------+  GSV      Full                                                        +---------+---------------+---------+-----------+----------+--------------+    Findings reported to [name] NP office. No answer at 9:30 am.  Summary: RIGHT: - No evidence of common femoral vein obstruction.  LEFT: - Findings consistent with chronic deep vein thrombosis involving the left popliteal vein.  - No cystic structure found in the popliteal fossa.  *See table(s) above for measurements and observations. Electronically signed by Deitra Mayo MD on 12/04/2020 at 10:15:32 AM.    Final     All questions were answered. The patient knows to call the clinic with any problems, questions or concerns. I spent 45 minutes in the care of this patient including H and P, review  of records, counseling and coordination of care.     [name], MD 12/09/2020 12:51 PM

## 2020-12-10 LAB — DRVVT MIX: dRVVT Mix: 65.2 s — ABNORMAL HIGH (ref 0.0–40.4)

## 2020-12-10 LAB — DRVVT CONFIRM: dRVVT Confirm: 1 ratio (ref 0.8–1.2)

## 2020-12-10 LAB — LUPUS ANTICOAGULANT PANEL
DRVVT: 90.9 s — ABNORMAL HIGH (ref 0.0–47.0)
PTT Lupus Anticoagulant: 35.8 s (ref 0.0–51.9)

## 2020-12-10 LAB — BETA-2-GLYCOPROTEIN I ABS, IGG/M/A
Beta-2 Glyco I IgG: <9 GPI IgG units (ref 0–20)
Beta-2-Glycoprotein I IgA: <9 GPI IgA units (ref 0–25)
Beta-2-Glycoprotein I IgM: <9 GPI IgM units (ref 0–32)

## 2020-12-10 LAB — CARDIOLIPIN ANTIBODIES, IGG, IGM, IGA
Anticardiolipin IgA: <9 APL U/mL (ref 0–11)
Anticardiolipin IgG: <9 GPL U/mL (ref 0–14)
Anticardiolipin IgM: <9 MPL U/mL (ref 0–12)

## 2020-12-10 LAB — PROTEIN S ACTIVITY: Protein S Activity: 114 % (ref 63–140)

## 2020-12-10 LAB — PROTEIN C ACTIVITY: Protein C Activity: 103 % (ref 73–180)

## 2020-12-11 ENCOUNTER — Encounter: Payer: Self-pay | Admitting: Hematology and Oncology

## 2020-12-11 MED ORDER — ONDANSETRON HCL 8 MG PO TABS: 8.0000 mg | ORAL_TABLET | Freq: Three times a day (TID) | ORAL | 0 refills | Status: DC | PRN

## 2020-12-11 NOTE — Telephone Encounter (Signed)
Please advise 

## 2020-12-11 NOTE — Telephone Encounter (Signed)
This has been addressed. Please see other MyChart encounter.

## 2020-12-12 LAB — HEXAGONAL PHOSPHOLIPID NEUTRALIZATION: Hexagonal Phospholipid Neutral: 9 s

## 2020-12-12 LAB — FACTOR 5 LEIDEN

## 2020-12-13 LAB — PROTHROMBIN GENE MUTATION

## 2020-12-13 NOTE — Telephone Encounter (Signed)
Hi Barb   and Ms Stegenga I was out of   office for 10 days .  Sorry you are  having  problems with  leg pain  that seems to be from  ongoing chronic  DVT.   I reviewed the notes  and seems like the pain is from the chronic clot in leg  ( and not a knee problem alone)   sometimes  recurrent  clotting  happens when  leg veins are distorted from past clot and   she may be  has a hereditary tendency to have  clots .    The hematologist advised  lovenox injection ( this is a very good medication ,although  injection sounds  scary.) Some patients  are put on warfarin which is harder  and tedious to manage in her age group.   Before we had these pill options ,we prescribed this lovenox type medications   often (able to treat  as outpatient  instead of  being in hospital    on heparin infusions.). This med has been used to prevent clots  and treat clots   in hospital and also  in people who still clot on the pills. ( Or is pregnant  )    Also less tendency to bleed  a lot since it is a short acting medication .   Since she had  a life threatening clot to the lungs a few years  ago,    adequate  blood thinning is   advised.   Benefit more than risk of this medication .  I have  had a few patients that  take lovenox as advised by hematology   We can make a  virtual to discuss further if you wish    otherwise in person  visit in about  2-3 weeks to check her leg   and follow up.

## 2020-12-16 ENCOUNTER — Other Ambulatory Visit: Payer: Self-pay

## 2020-12-16 ENCOUNTER — Encounter (HOSPITAL_BASED_OUTPATIENT_CLINIC_OR_DEPARTMENT_OTHER): Payer: Self-pay

## 2020-12-16 ENCOUNTER — Emergency Department (HOSPITAL_BASED_OUTPATIENT_CLINIC_OR_DEPARTMENT_OTHER): Payer: Medicare Other

## 2020-12-16 ENCOUNTER — Emergency Department (HOSPITAL_BASED_OUTPATIENT_CLINIC_OR_DEPARTMENT_OTHER): Payer: Medicare Other | Admitting: Radiology

## 2020-12-16 ENCOUNTER — Emergency Department (HOSPITAL_BASED_OUTPATIENT_CLINIC_OR_DEPARTMENT_OTHER)
Admission: EM | Admit: 2020-12-16 | Discharge: 2020-12-16 | Disposition: A | Discharge status: Home / Self Care | Payer: Medicare Other | Attending: Emergency Medicine | Admitting: Emergency Medicine

## 2020-12-16 DIAGNOSIS — Z853 Personal history of malignant neoplasm of breast: Secondary | ICD-10-CM | POA: Diagnosis not present

## 2020-12-16 DIAGNOSIS — R0602 Shortness of breath: Secondary | ICD-10-CM | POA: Insufficient documentation

## 2020-12-16 DIAGNOSIS — R110 Nausea: Secondary | ICD-10-CM | POA: Insufficient documentation

## 2020-12-16 DIAGNOSIS — Z79899 Other long term (current) drug therapy: Secondary | ICD-10-CM | POA: Insufficient documentation

## 2020-12-16 DIAGNOSIS — M25562 Pain in left knee: Secondary | ICD-10-CM | POA: Insufficient documentation

## 2020-12-16 DIAGNOSIS — M109 Gout, unspecified: Secondary | ICD-10-CM

## 2020-12-16 DIAGNOSIS — I10 Essential (primary) hypertension: Secondary | ICD-10-CM | POA: Diagnosis not present

## 2020-12-16 LAB — CBC WITH DIFFERENTIAL/PLATELET
Abs Immature Granulocytes: 0.05 10*3/uL (ref 0.00–0.07)
Basophils Absolute: 0 10*3/uL (ref 0.0–0.1)
Basophils Relative: 0 %
Eosinophils Absolute: 0.2 10*3/uL (ref 0.0–0.5)
Eosinophils Relative: 2 %
HCT: 39.3 % (ref 36.0–46.0)
Hemoglobin: 13.4 g/dL (ref 12.0–15.0)
Immature Granulocytes: 1 %
Lymphocytes Relative: 24 %
Lymphs Abs: 2.2 10*3/uL (ref 0.7–4.0)
MCH: 32.4 pg (ref 26.0–34.0)
MCHC: 34.1 g/dL (ref 30.0–36.0)
MCV: 94.9 fL (ref 80.0–100.0)
Monocytes Absolute: 0.8 10*3/uL (ref 0.1–1.0)
Monocytes Relative: 9 %
Neutro Abs: 5.8 10*3/uL (ref 1.7–7.7)
Neutrophils Relative %: 64 %
Platelets: 253 10*3/uL (ref 150–400)
RBC: 4.14 MIL/uL (ref 3.87–5.11)
RDW: 12.4 % (ref 11.5–15.5)
WBC: 9.1 10*3/uL (ref 4.0–10.5)
nRBC: 0 % (ref 0.0–0.2)

## 2020-12-16 LAB — BASIC METABOLIC PANEL
Anion gap: 10 (ref 5–15)
BUN: 17 mg/dL (ref 8–23)
CO2: 23 mmol/L (ref 22–32)
Calcium: 9.2 mg/dL (ref 8.9–10.3)
Chloride: 105 mmol/L (ref 98–111)
Creatinine, Ser: 0.92 mg/dL (ref 0.44–1.00)
GFR, Estimated: 58 mL/min — ABNORMAL LOW (ref >60)
Glucose, Bld: 130 mg/dL — ABNORMAL HIGH (ref 70–99)
Potassium: 4.4 mmol/L (ref 3.5–5.1)
Sodium: 138 mmol/L (ref 135–145)

## 2020-12-16 MED ORDER — TRAMADOL HCL 50 MG PO TABS: 50.0000 mg | ORAL_TABLET | Freq: Four times a day (QID) | ORAL | 0 refills | Status: AC | PRN

## 2020-12-16 MED ORDER — COLCHICINE 0.6 MG PO TABS: 0.6000 mg | ORAL_TABLET | Freq: Once | ORAL | Status: DC

## 2020-12-16 MED ORDER — IOHEXOL 350 MG/ML SOLN
75.0000 mL | Freq: Once | INTRAVENOUS | Status: AC | PRN
  Administered 2020-12-16: 75 mL via INTRAVENOUS

## 2020-12-16 MED ORDER — COLCHICINE 0.6 MG PO TABS
1.2000 mg | ORAL_TABLET | Freq: Once | ORAL | Status: AC
  Administered 2020-12-16: 1.2 mg via ORAL
  Filled 2020-12-16: qty 2

## 2020-12-16 MED ORDER — ONDANSETRON HCL 4 MG/2ML IJ SOLN
4.0000 mg | Freq: Once | INTRAMUSCULAR | Status: AC
  Administered 2020-12-16: 4 mg via INTRAVENOUS

## 2020-12-16 MED ORDER — FENTANYL CITRATE PF 50 MCG/ML IJ SOSY
50.0000 ug | PREFILLED_SYRINGE | Freq: Once | INTRAMUSCULAR | Status: AC
  Administered 2020-12-16: 50 ug via INTRAVENOUS
  Filled 2020-12-16: qty 1

## 2020-12-16 NOTE — ED Provider Notes (Signed)
Signout note  87 year old lady presenting to ER with concern for left knee pain.  Ongoing for many weeks.  Has chronic DVT.  Recently switched to Lovenox.  X-ray concerning for possible gout.  No signs or symptoms to suggest septic arthritis, arthrocentesis deferred.  Had endorsing shortness of breath so PE study was ordered.  Given dose of colchicine.  If PE study negative, discharge.  7:00 Am received sign out from [place]  8:24 AM PE study negative, pain well controlled, patient denies ongoing symptoms, will give very short Rx of tramadol for breakthrough pain, recommend follow-up with primary doctor and hematology   Lucrezia Starch, MD 12/16/20 [phone]

## 2020-12-16 NOTE — ED Triage Notes (Addendum)
Pt is present for left knee pain that has been ongoing for three weeks. Pt states that she had an US done but it was negative. Did not injure knee but the pain has become intolerable. Pt also c/o nausea that has also been ongoing for weeks but unsure if it is associated to the knee pain. Pain when bearing weight on leg but is able to. Has been ambulating with a walker. Currently taking Eliquis for DVT hx.

## 2020-12-16 NOTE — ED Notes (Signed)
Patient transported to CT 

## 2020-12-16 NOTE — Discharge Instructions (Addendum)
Please follow-up both with your hematologist/oncologist as well as with your primary care doctor.  Continue the Lovenox as previously prescribed.  For pain recommend Tylenol as needed.  Can take tramadol as needed for severe pain.  Note this can make you drowsy and should not be taken while driving.  Come back to ER if you develop worsening pain, knee swelling, fever, difficulty breathing, chest pain or other new concerning symptom.

## 2020-12-16 NOTE — ED Notes (Signed)
XR at the bedside.

## 2020-12-16 NOTE — ED Provider Notes (Addendum)
[place] EMERGENCY DEPT Provider Note   CSN: [id number] Arrival date & time: 12/16/20  0543     History Chief Complaint  Patient presents with   Knee Pain   Nausea    Julie Boone is a 87 y.o. female.  HPI     This is a 87 year old female with a history of breast cancer, hypertension, DVT who presents with left knee pain.  Patient has had ongoing and worsening left knee pain over the last several weeks.  Patient and daughter reveal that she has been worked up as an outpatient and was found to have a chronic blood clot behind her left knee which was thought to be causing her pain.  She had previously been on Eliquis but was transitioned to Lovenox for concern that Eliquis had failed.  Patient reports that she does not tolerate Lovenox injections for fear of needles.  She states that her pain has progressively worsened and now she can barely bear weight on her leg.  She had been taking tramadol with some relief but that seems to not work as much anymore.  Patient also reports ongoing nausea which is not necessarily new.  She does not relate it specifically to pain medications.  She is taking Zofran with some relief.  Additionally she states she has felt intermittent shortness of breath.  Again, she cannot reliably tell me whether this is new or has been ongoing but is concerned about that this morning.  She states that overnight last night she "just prayed that the pain would go away."  Currently she rates her pain at 10 out of 10.  Has not noticed numbness or tingling of the legs.  No recent fevers.  Chart reviewed.  Patient seen by primary physician and hematologist.  Concerned that chronic DVT was the cause of her pain.  Highly recommended transition to Lovenox.  Past Medical History:  Diagnosis Date   Acute sinusitis with symptoms greater than 10 days 06/23/2013   Arthritis    Breast cancer [facility])    mastectomy 3   neg ln    HOH (hard of hearing)    HTN  (hypertension)    Wears glasses    Wears hearing aid    both ears    Patient Active Problem List   Diagnosis Date Noted   Gastroesophageal reflux disease 01/12/2019   Lower abdominal pain 01/12/2019   Other constipation 01/12/2019   Acute pulmonary embolism without acute cor pulmonale (HCC) 04/05/2018   Abnormal chest x-ray 09/22/2013   Allergic rhinitis 06/23/2013   Wax in ear 06/23/2013   Abnormal thyroid blood test 04/14/2013   Elevated blood sugar 04/14/2013   Decreased hearing 04/14/2013   Wears hearing aid 03/03/2013   Essential hypertension 03/03/2013   Leg cramps 03/03/2013   Influenza vaccination declined by patient 03/03/2013   Pneumococcal vaccination declined by patient 03/03/2013   Breast cancer [facility])     Past Surgical History:  Procedure Laterality Date   APPENDECTOMY  2006   BREAST BIOPSY Left 2012   COLONOSCOPY     HERNIA REPAIR     INSERTION OF MESH N/A 09/05/2013   Procedure: INSERTION OF MESH;  Surgeon: Joyice Faster. Cornett, MD;  Location: [place];  Service: General;  Laterality: N/A;   MASTECTOMY Left 2013   TONSILLECTOMY AND ADENOIDECTOMY     Childhood   UMBILICAL HERNIA REPAIR N/A 09/05/2013   Procedure: HERNIA REPAIR UMBILICAL ADULT WITH MESH;  Surgeon: Joyice Faster. Cornett, MD;  Location: [place];  Service: General;  Laterality: N/A;     OB History     Gravida  0   Para  0   Term  0   Preterm  0   AB  0   Living  0      SAB  0   IAB  0   Ectopic  0   Multiple  0   Live Births              Family History  Problem Relation Age of Onset   Hypertension Mother    Stroke Mother    Heart failure Mother    Lung disease Sister        smoker   Colon cancer Father        dx in his late 27's   Esophageal cancer Neg Hx    Inflammatory bowel disease Neg Hx    Liver disease Neg Hx    Pancreatic cancer Neg Hx    Stomach cancer Neg Hx     Social History   Tobacco Use   Smoking status: Never    Smokeless tobacco: Never  Vaping Use   Vaping Use: Never used  Substance Use Topics   Alcohol use: Yes    Alcohol/week: 0.0 standard drinks    Comment: 1 time a month. has a sip ever now and then    Drug use: No    Home Medications Prior to Admission medications   Medication Sig Start Date End Date Taking? Authorizing Provider  atenolol (TENORMIN) 50 MG tablet TAKE 1 TABLET (50 MG TOTAL) BY MOUTH DAILY. TAKE 1 AND 1/2 TABLETS EVERY DAY 08/12/20   Panosh, Standley Brooking, MD  Cholecalciferol (VITAMIN D-3) 125 MCG (5000 UT) TABS Take 1 tablet by mouth daily.     [provider]  Cyanocobalamin (VITAMIN B12) 3000 MCG SUBL Place 1 tablet under the tongue daily.    [provider]  enoxaparin (LOVENOX) 100 MG/ML injection Inject 1 mL (100 mg total) into the skin daily. 12/09/20   [name], MD  hydrOXYzine (ATARAX/VISTARIL) 10 MG tablet Take 1-2 tablets (10-20 mg total) by mouth 3 (three) times daily as needed for itching. 09/09/20   Panosh, Standley Brooking, MD  lisinopril (ZESTRIL) 40 MG tablet TAKE 1 TABLET BY MOUTH DAILY. MAY DECREASE TO 1/2 TABLET BY MOUTH PER DAY AS INDICATED 07/22/20   Panosh, Standley Brooking, MD  meclizine (ANTIVERT) 25 MG tablet 1/2 to 1  po up to every 8 hours as needed for vertigo  Fall precautions Patient taking differently: 1/2 to 1  po up to every 8 hours as needed for vertigo  Fall precautions 06/13/19   Panosh, Standley Brooking, MD  Omega-3 Fatty Acids (FISH OIL) 500 MG CAPS Take 500 mg by mouth 3 (three) times a week.    [provider]  ondansetron (ZOFRAN) 8 MG tablet Take 1 tablet (8 mg total) by mouth every 8 (eight) hours as needed for nausea or vomiting. 12/11/20   Nafziger, Tommi Rumps, NP  triamcinolone cream (KENALOG) 0.1 % Apply 1 application topically 2 (two) times daily. To rash 09/09/20   Panosh, Standley Brooking, MD  UNABLE TO FIND Med Name: [facility] leg cramps ho meo pathix med  Takes as needed for leg cramps says it works well    [provider]     Allergies    Shellfish allergy and Morphine and related  Review of Systems   Review of Systems  Constitutional:  Negative for fever.  Respiratory:  Positive for shortness of breath.   Cardiovascular:  Negative for chest pain.  Gastrointestinal:  Positive for nausea. Negative for abdominal pain and vomiting.  Genitourinary:  Negative for dysuria.  Musculoskeletal:        Left knee pain  Skin:  Negative for color change.  All other systems reviewed and are negative.  Physical Exam Updated Vital Signs BP (!) 154/89 (BP Location: Right Arm)   Pulse 66   Temp 98.4 F (36.9 C) (Oral)   Resp 18   Ht 1.6 m (5\' 3" )   Wt 76.2 kg   SpO2 98%   BMI 29.76 kg/m   Physical Exam Vitals and nursing note reviewed.  Constitutional:      Appearance: She is well-developed. She is not ill-appearing.  HENT:     Head: Normocephalic and atraumatic.     Nose: Nose normal.     Mouth/Throat:     Mouth: Mucous membranes are moist.  Eyes:     Pupils: Pupils are equal, round, and reactive to light.  Cardiovascular:     Rate and Rhythm: Normal rate and regular rhythm.     Heart sounds: Normal heart sounds.  Pulmonary:     Effort: Pulmonary effort is normal. No respiratory distress.     Breath sounds: No wheezing.     Comments: No respiratory distress Abdominal:     Palpations: Abdomen is soft.     Tenderness: There is no abdominal tenderness.  Musculoskeletal:     Cervical back: Neck supple.     Comments: Normal active and passive range of motion of the left knee, no overlying skin changes, tenderness to palpation over the medial inferior portion of the knee, slight asymmetric swelling left greater than right, 2+ DP pulses distally  Skin:    General: Skin is warm and dry.  Neurological:     Mental Status: She is alert and oriented to person, place, and time.  Psychiatric:        Mood and Affect: Mood normal.    ED Results / Procedures / Treatments   Labs (all labs ordered are  listed, but only abnormal results are displayed) Labs Reviewed  CBC WITH DIFFERENTIAL/PLATELET  BASIC METABOLIC PANEL    EKG None  Radiology No results found.  Procedures Procedures   Medications Ordered in ED Medications  fentaNYL (SUBLIMAZE) injection 50 mcg (has no administration in time range)  ondansetron (ZOFRAN) injection 4 mg (has no administration in time range)    ED Course  I have reviewed the triage vital signs and the nursing notes.  Pertinent labs & imaging results that were available during my care of the patient were reviewed by me and considered in my medical decision making (see chart for details).    MDM Rules/Calculators/A&P                           Patient presents with ongoing and worsening left knee pain.  Also reports nausea and shortness of breath.  She is overall nontoxic and vital signs are reassuring.  She is afebrile.  Pulse rate 66, O2 sats 98%.  She is no respiratory distress.  Her symptoms appear acute on chronic and/or subacute.  Her pain seems more joint related; however, there are no signs or symptoms of septic arthritis and no overlying skin changes.  She would be high risk for arthrocentesis given she is on blood thinners.  She does endorse  some shortness of breath so PE is a consideration although this is less likely with a chronic DVT.  We will start work-up and provide pain medication.  6:57 AM X-ray independently reviewed by myself.  Suggestion of chondrocalcinosis.  Patient's pain corresponds with location of calcium deposits in the medial aspect of the knee.  She is too high risk for arthroscopy and confirmation.  We will opt for colchichine.  Final Clinical Impression(s) / ED Diagnoses Final diagnoses:  None    Rx / DC Orders ED Discharge Orders     None        Chasitty Hehl, Barbette Hair, MD 12/16/20 [id number]    Merryl Hacker, MD 12/16/20 [phone]

## 2020-12-18 ENCOUNTER — Other Ambulatory Visit: Payer: Self-pay | Admitting: Hematology and Oncology

## 2020-12-18 ENCOUNTER — Encounter: Payer: Self-pay | Admitting: Hematology and Oncology

## 2020-12-18 NOTE — Progress Notes (Signed)
I called Ms Pamala Hurry back. She said mom was having lot of knee pain. I did review the recent X rays which suggested pseudogout.  Last [place] didn't reveal acute DVT or popliteal fossa abnormalities. We discussed that initial treatment for pseudogout is either NSAID's /steroids. I discussed increased risk of bleeding and GI intolerance with NSAIDs. She will try naproxen 500 mg PO BID for 48 hrs and was asked to contact her PCP for any further recommendations to manage her knee pain. I have reviewed hypercoagulable work up which is neg for any clotting disorder. Abnormal LA testing likely from concomitant use of Eliquis, She apparently tried lovenox, but couldn't tolerate it. She went back on Eliquis, Since the exact timing of her DVT is unknown and its not very clear if she certainly failed eliquis, given her age, poor tolerance to lovenox and inconvenience with warfarin, She amy continue eliquis for now. Daughter instructed to take her to the nearest facility if any concerns for PE or acute DVT  Josmar Messimer

## 2021-01-02 ENCOUNTER — Telehealth: Payer: Self-pay | Admitting: Pharmacist

## 2021-01-02 NOTE — Chronic Care Management (AMB) (Signed)
Chronic Care Management Pharmacy Assistant   Name: Julie Boone  MRN: [id number] DOB: 1924-04-17  Reason for Encounter: General Assessment Call    Conditions to be addressed/monitored: HTN  Recent office visits:  12/03/20 Julie Peng, NP - Patient presented for Acute pain of left knee. No medication changes.  Recent consult visits:  12/09/20 Julie Pike, MD (Oncology) - Patient presented for Lower extremity history of DVT. Prescribed Enoxaparin Sodium 100 mg. Stopped Eliquis 5 mg.   11/26/20 Julie Nunnery, MD (Otolaryngology) - Patient presented for Bilateral impacted cerumen and other concerns. No medication changes.  Hospital visits:  Medication Reconciliation was completed by comparing discharge summary, patient's EMR and Pharmacy list, and upon discussion with patient.  Patient presented to [facility] ED on 12/16/20 due to knee pain and nausea. Patient was there for 2 hours.  New?Medications Started at [facility] Discharge:?? -started  Tramadol 50 mg Q6H for 3 days  Medication Changes at Hospital Discharge: -Changed None  Medications Discontinued at Hospital Discharge: -Stopped None  Medications that remain the same after Hospital Discharge:??  -All other medications will remain the same.    Medications: Outpatient Encounter Medications as of 01/02/2021  Medication Sig   atenolol (TENORMIN) 50 MG tablet TAKE 1 TABLET (50 MG TOTAL) BY MOUTH DAILY. TAKE 1 AND 1/2 TABLETS EVERY DAY   Cholecalciferol (VITAMIN D-3) 125 MCG (5000 UT) TABS Take 1 tablet by mouth daily.    Cyanocobalamin (VITAMIN B12) 3000 MCG SUBL Place 1 tablet under the tongue daily.   enoxaparin (LOVENOX) 100 MG/ML injection Inject 1 mL (100 mg total) into the skin daily.   hydrOXYzine (ATARAX/VISTARIL) 10 MG tablet Take 1-2 tablets (10-20 mg total) by mouth 3 (three) times daily as needed for itching.   lisinopril (ZESTRIL) 40 MG tablet TAKE 1 TABLET BY MOUTH DAILY.  MAY DECREASE TO 1/2 TABLET BY MOUTH PER DAY AS INDICATED   meclizine (ANTIVERT) 25 MG tablet 1/2 to 1  po up to every 8 hours as needed for vertigo  Fall precautions (Patient taking differently: 1/2 to 1  po up to every 8 hours as needed for vertigo  Fall precautions)   Omega-3 Fatty Acids (FISH OIL) 500 MG CAPS Take 500 mg by mouth 3 (three) times a week.   ondansetron (ZOFRAN) 8 MG tablet Take 1 tablet (8 mg total) by mouth every 8 (eight) hours as needed for nausea or vomiting.   triamcinolone cream (KENALOG) 0.1 % Apply 1 application topically 2 (two) times daily. To rash   UNABLE TO FIND Med Name: [place] leg cramps ho meo pathix med  Takes as needed for leg cramps says it works well   No facility-administered encounter medications on file as of 01/02/2021.  Reviewed chart prior to disease state call. Spoke with patient regarding BP  Recent Office Vitals: BP Readings from Last 3 Encounters:  12/16/20 (!) 138/58  12/09/20 135/84  12/03/20 120/78   Pulse Readings from Last 3 Encounters:  12/16/20 (!) 58  12/09/20 (!) 57  12/03/20 70    Wt Readings from Last 3 Encounters:  12/16/20 168 lb (76.2 kg)  09/09/20 167 lb 6.4 oz (75.9 kg)  07/30/20 164 lb 3.2 oz (74.5 kg)     Kidney Function Lab Results  Component Value Date/Time   CREATININE 0.92 12/16/2020 06:30 AM   CREATININE 0.88 05/21/2020 10:55 AM   GFR 56.36 (L) 05/21/2020 10:55 AM   GFRNONAA 58 (L) 12/16/2020 06:30 AM   GFRAA 69 04/25/2018  11:38 AM    BMP Latest Ref Rng & Units 12/16/2020 05/21/2020 07/10/2019  Glucose 70 - 99 mg/dL 130(H) 122(H) 106(H)  BUN 8 - 23 mg/dL 17 25(H) 26(H)  Creatinine 0.44 - 1.00 mg/dL 0.92 0.88 1.10  BUN/Creat Ratio 12 - 28 - - -  Sodium 135 - 145 mmol/L 138 140 142  Potassium 3.5 - 5.1 mmol/L 4.4 4.6 4.8  Chloride 98 - 111 mmol/L 105 107 104  CO2 22 - 32 mmol/L 23 24 28   Calcium 8.9 - 10.3 mg/dL 9.2 9.6 10.0   Spoke with patients daughter  Current antihypertensive regimen:   Atenolol 50mg ,1 tablet once daily (AM) Lisinopril 40mg , 1 tablet once daily.(PM) How often are you checking your Blood Pressure? Daughter reports she is having it checked frequently at [employer] and she checks it for her as well her readings include 130/80 she states she has not been complaining of any headache, dizziness or lightheadedness. Current home BP readings: 130/80 homed 138/58 office What recent interventions/DTPs have been made by any provider to improve Blood Pressure control since last CPP Visit: Daughter reports no changes Any recent hospitalizations or ED visits since last visit with CPP? No What diet changes have been made to improve Blood Pressure Control?  Daughter reports no changes What exercise is being done to improve your Blood Pressure Control?  Daughter reports she is using her cane more and walker a little less after participating in [employer]  she reports she thinks it is really helping her mobility in the knee.  Adherence Review: Is the patient currently on ACE/ARB medication? Yes Does the patient have >5 day gap between last estimated fill dates? Yes  Lisinopril (Zestril) 40 mg - Last filled 07/26/2020 90 DS at [facility] Atenolol (Tenormin) 50 mg - Last filled 08/14/20 90 DS at [facility]  Notes: Daughter reports she is intrested in a triangular folding walker to  use when they are out would like to see if a prescription for one is an option. Advised I would forward her request to the office an that she may also check her local medical supply stores or even [employer] .  Care Gaps: BP - 138/58 (10/22) PNA Vaccine - Overdue TDAP - Overdue Zoster Vaccine - Overdue Mammogram - Overdue COVID Booster #4 Therapist, music) - Overdue Flu Vaccine - Overdue AWV - 07/2021 CCM F/U Call - Declined at this time  Star Rating Drugs: Lisinopril (Zestril) 40 mg - Last filled 07/26/2020 90 DS at [facility] Call to pharmacies on file and verified this date is  accurate    [place] Pharmacist Assistant [phone]

## 2021-01-06 ENCOUNTER — Other Ambulatory Visit: Payer: Self-pay | Admitting: Hematology and Oncology

## 2021-01-06 ENCOUNTER — Other Ambulatory Visit: Payer: Medicare Other

## 2021-01-06 ENCOUNTER — Ambulatory Visit: Payer: Medicare Other | Admitting: Hematology and Oncology

## 2021-01-06 DIAGNOSIS — Z86718 Personal history of other venous thrombosis and embolism: Secondary | ICD-10-CM

## 2021-01-13 ENCOUNTER — Other Ambulatory Visit: Payer: Self-pay

## 2021-01-13 MED ORDER — ATENOLOL 50 MG PO TABS: ORAL_TABLET | ORAL | 0 refills | Status: DC

## 2021-01-13 MED ORDER — MECLIZINE HCL 25 MG PO TABS: ORAL_TABLET | ORAL | 0 refills | Status: DC

## 2021-01-13 NOTE — Telephone Encounter (Signed)
I received rx request regarding the pt's Atenolol 50mg . The pharmacy was requesting clarification on the pt's sig. I contacted the pt in which the pt's daughter informed me the pt is currently taking Atenolol 50 mg once daily. Pt sig has been updated and rx has been sent to reflect change. Per pt request Meclizine 25mg  has been refilled.

## 2021-01-16 ENCOUNTER — Telehealth: Payer: Self-pay

## 2021-01-16 NOTE — Telephone Encounter (Signed)
-----   Message from Viona Gilmore, [facility] sent at 01/16/2021  2:48 PM EST ----- Regarding: FW: foldable walker Hi,  Please see message below. My assistant spoke with her daughter inquiring about a foldable triangular walker. Can this be prescribed? I really have no clue.  Thanks! Maddie  ----- Message ----- From: Ned Clines Sent: 01/10/2021   3:21 PM EST To: Viona Gilmore, RPH Subject: foldable walker                                Non urgent dgt wants to know if they can get a script for a foldable triangular walker to use when they are out, advised her I would ask but she might want to check on [facility] and a medical supply store as well.

## 2021-01-16 NOTE — Telephone Encounter (Signed)
Form has been signed by PCP for medical supplies. I left a detailed vm on the home phone informing the pt's daughter that forms have been placed in front office for pickup at her convenience and to return my call if she has any question or concerns.

## 2021-03-10 ENCOUNTER — Other Ambulatory Visit: Payer: Self-pay | Admitting: Internal Medicine

## 2021-03-12 ENCOUNTER — Encounter: Payer: Self-pay | Admitting: Internal Medicine

## 2021-03-12 ENCOUNTER — Other Ambulatory Visit: Payer: Self-pay

## 2021-03-13 MED ORDER — APIXABAN 5 MG PO TABS: 5.0000 mg | ORAL_TABLET | Freq: Two times a day (BID) | ORAL | 1 refills | Status: DC

## 2021-03-13 NOTE — Telephone Encounter (Signed)
89 F 75 kg, SCr 0.92 - 5 mg bid dose appropriate.

## 2021-04-05 ENCOUNTER — Other Ambulatory Visit: Payer: Self-pay | Admitting: Internal Medicine

## 2021-05-28 ENCOUNTER — Telehealth: Payer: Self-pay | Admitting: Pharmacist

## 2021-05-28 NOTE — Progress Notes (Addendum)
? ? ?Chronic Care Management ?Pharmacy Assistant  ? ?Name: Julie Boone  MRN: [id number] DOB: 1924-11-09 ? ?Reason for Encounter: Disease State General Assessment ?  ?Conditions to be addressed/monitored: ?HTN ? ?Recent office visits:  ?None ? ?Recent consult visits:  ?None ? ?Hospital visits:  ?Medication Reconciliation was completed by comparing discharge summary, patient?s EMR and Pharmacy list, and upon discussion with patient. ?  ?Patient presented to [place] ED on 12/16/20 due to knee pain and nausea. Patient was there for 2 hours. ?  ?New?Medications Started at [facility] Discharge:?? ?-started  ?Tramadol 50 mg Q6H for 3 days ?  ?Medication Changes at Hospital Discharge: ?-Changed None ?  ?Medications Discontinued at Hospital Discharge: ?-Stopped None ?  ?Medications that remain the same after Hospital Discharge:??  ?-All other medications will remain the same.   ?  ? ?Medications: ?Outpatient Encounter Medications as of 05/28/2021  ?Medication Sig  ? apixaban (ELIQUIS) 5 MG TABS tablet Take 1 tablet (5 mg total) by mouth 2 (two) times daily.  ? atenolol (TENORMIN) 50 MG tablet TAKE 1.5 TABLETS BY MOUTH DAILY  ? Cholecalciferol (VITAMIN D-3) 125 MCG (5000 UT) TABS Take 1 tablet by mouth daily.   ? Cyanocobalamin (VITAMIN B12) 3000 MCG SUBL Place 1 tablet under the tongue daily.  ? hydrOXYzine (ATARAX/VISTARIL) 10 MG tablet Take 1-2 tablets (10-20 mg total) by mouth 3 (three) times daily as needed for itching.  ? lisinopril (ZESTRIL) 40 MG tablet TAKE 1 TABLET BY MOUTH DAILY. MAY DECREASE TO 1/2 TABLET BY MOUTH PER DAY AS INDICATED  ? meclizine (ANTIVERT) 25 MG tablet 1/2 to 1  po up to every 8 hours as needed for vertigo  Fall precautions  ? Omega-3 Fatty Acids (FISH OIL) 500 MG CAPS Take 500 mg by mouth 3 (three) times a week.  ? ondansetron (ZOFRAN) 8 MG tablet Take 1 tablet (8 mg total) by mouth every 8 (eight) hours as needed for nausea or vomiting.  ? triamcinolone cream (KENALOG)  0.1 % Apply 1 application topically 2 (two) times daily. To rash  ? UNABLE TO FIND Med Name: [place] leg cramps ho meo pathix med  ?Takes as needed for leg cramps says it works well  ? ?No facility-administered encounter medications on file as of 05/28/2021.  ?Reviewed chart prior to disease state call. Spoke with patient regarding BP ? ?Recent Office Vitals: ?BP Readings from Last 3 Encounters:  ?12/16/20 (!) 138/58  ?12/09/20 135/84  ?12/03/20 120/78  ? ?Pulse Readings from Last 3 Encounters:  ?12/16/20 (!) 58  ?12/09/20 (!) 57  ?12/03/20 70  ?  ?Wt Readings from Last 3 Encounters:  ?12/16/20 168 lb (76.2 kg)  ?09/09/20 167 lb 6.4 oz (75.9 kg)  ?07/30/20 164 lb 3.2 oz (74.5 kg)  ?  ? ?Kidney Function ?Lab Results  ?Component Value Date/Time  ? CREATININE 0.92 12/16/2020 06:30 AM  ? CREATININE 0.88 05/21/2020 10:55 AM  ? GFR 56.36 (L) 05/21/2020 10:55 AM  ? GFRNONAA 58 (L) 12/16/2020 06:30 AM  ? GFRAA 69 04/25/2018 11:38 AM  ? ? ? ?  Latest Ref Rng & Units 12/16/2020  ?  6:30 AM 05/21/2020  ? 10:55 AM 07/10/2019  ?  3:37 PM  ?BMP  ?Glucose 70 - 99 mg/dL 130   122   106    ?BUN 8 - 23 mg/dL '17   25   26    '$ ?Creatinine 0.44 - 1.00 mg/dL 0.92   0.88   1.10    ?  Sodium 135 - 145 mmol/L 138   140   142    ?Potassium 3.5 - 5.1 mmol/L 4.4   4.6   4.8    ?Chloride 98 - 111 mmol/L 105   107   104    ?CO2 22 - 32 mmol/L '23   24   28    '$ ?Calcium 8.9 - 10.3 mg/dL 9.2   9.6   10.0    ? ?Spoke to daughter Pamala Hurry for the call. ? ?Current antihypertensive regimen:  ?Atenolol '50mg'$ ,1 tablet once daily (AM) ?Lisinopril '40mg'$ , 1 tablet once daily.(PM ?How often are you checking your Blood Pressure? weekly ?Current home BP readings: Daughter reports her readings have been doing well all normal, she denies any hypo/hypertensive complaints from her mom. ?What recent interventions/DTPs have been made by any provider to improve Blood Pressure control since last CPP Visit: Daughter reports none ?Any recent hospitalizations or ED visits since  last visit with CPP? No ?What exercise is being done to improve your Blood Pressure Control?  ?Daughter reports she thinks the [employer] really helped her with her knee. She is getting around well, she tries not to use the cane but is reminded she should for her balance. ?Daughter reports no concerns or issues at this time.She reports compliance in medications for blood pressures. ? ?Adherence Review: ?Is the patient currently on ACE/ARB medication? Yes ?Does the patient have >5 day gap between last estimated fill dates? Yes ? ? ? ?Care Gaps: ?BP - 138/58 (10/22) ?PNA Vaccine - Overdue ?TDAP - Overdue ?Zoster Vaccine - Overdue ?Mammogram - Overdue ?COVID Booster #4 Therapist, music) - Overdue ?Flu Vaccine - Overdue ?AWV - 07/2021 ?CCM F/U Call - 7/23 (awv in June in office) ? ?Star Rating Drugs: ?Lisinopril (Zestril) 40 mg - Last filled 07/26/2020 90 DS at [facility] ?Call to pharm verified as accurate ? ?Ned Clines CMA ?Clinical Pharmacist Assistant ?[phone] ? ?

## 2021-06-03 ENCOUNTER — Encounter: Payer: Self-pay | Admitting: Internal Medicine

## 2021-06-03 DIAGNOSIS — I10 Essential (primary) hypertension: Secondary | ICD-10-CM

## 2021-06-03 MED ORDER — LISINOPRIL 20 MG PO TABS: 20.0000 mg | ORAL_TABLET | Freq: Every day | ORAL | 0 refills | Status: DC

## 2021-07-07 ENCOUNTER — Other Ambulatory Visit: Payer: Self-pay | Admitting: Internal Medicine

## 2021-07-21 ENCOUNTER — Ambulatory Visit (INDEPENDENT_AMBULATORY_CARE_PROVIDER_SITE_OTHER): Payer: Medicare Other | Admitting: Family Medicine

## 2021-07-21 ENCOUNTER — Encounter: Payer: Self-pay | Admitting: Family Medicine

## 2021-07-21 VITALS — BP 165/77 | HR 71 | Temp 97.8°F | Ht 63.0 in | Wt 166.0 lb

## 2021-07-21 DIAGNOSIS — I10 Essential (primary) hypertension: Secondary | ICD-10-CM

## 2021-07-21 DIAGNOSIS — R9431 Abnormal electrocardiogram [ECG] [EKG]: Secondary | ICD-10-CM | POA: Diagnosis not present

## 2021-07-21 LAB — COMPREHENSIVE METABOLIC PANEL
ALT: 14 U/L (ref 0–35)
AST: 20 U/L (ref 0–37)
Albumin: 4 g/dL (ref 3.5–5.2)
Alkaline Phosphatase: 71 U/L (ref 39–117)
BUN: 24 mg/dL — ABNORMAL HIGH (ref 6–23)
CO2: 26 mEq/L (ref 19–32)
Calcium: 9.7 mg/dL (ref 8.4–10.5)
Chloride: 106 mEq/L (ref 96–112)
Creatinine, Ser: 1.08 mg/dL (ref 0.40–1.20)
GFR: 43.72 mL/min — ABNORMAL LOW (ref >60.00)
Glucose, Bld: 173 mg/dL — ABNORMAL HIGH (ref 70–99)
Potassium: 4.9 mEq/L (ref 3.5–5.1)
Sodium: 140 mEq/L (ref 135–145)
Total Bilirubin: 0.6 mg/dL (ref 0.2–1.2)
Total Protein: 6.5 g/dL (ref 6.0–8.3)

## 2021-07-21 LAB — CBC
HCT: 42.5 % (ref 36.0–46.0)
Hemoglobin: 14.2 g/dL (ref 12.0–15.0)
MCHC: 33.5 g/dL (ref 30.0–36.0)
MCV: 97 fl (ref 78.0–100.0)
Platelets: 258 10*3/uL (ref 150.0–400.0)
RBC: 4.38 Mil/uL (ref 3.87–5.11)
RDW: 12.8 % (ref 11.5–15.5)
WBC: 8.3 10*3/uL (ref 4.0–10.5)

## 2021-07-21 LAB — TSH: TSH: 2.59 u[IU]/mL (ref 0.35–5.50)

## 2021-07-21 NOTE — Patient Instructions (Signed)
It was very nice to see you today!  I think you probably had a vertigo attack.  Your EKG today is normal and I do not see any signs of a stroke.  We will check blood work to make sure there is nothing else going on.  Please let us know if you have return of your symptoms.  Take care, Dr Jerline Pain  PLEASE NOTE:  If you had any lab tests please let us know if you have not heard back within a few days. You may see your results on mychart before we have a chance to review them but we will give you a call once they are reviewed by [place]. If we ordered any referrals today, please let us know if you have not heard from their office within the next week.   Please try these tips to maintain a healthy lifestyle:  Eat at least 3 REAL meals and 1-2 snacks per day.  Aim for no more than 5 hours between eating.  If you eat breakfast, please do so within one hour of getting up.   Each meal should contain half fruits/vegetables, one quarter protein, and one quarter carbs (no bigger than a computer mouse)  Cut down on sweet beverages. This includes juice, soda, and sweet tea.   Drink at least 1 glass of water with each meal and aim for at least 8 glasses per day  Exercise at least 150 minutes every week.

## 2021-07-21 NOTE — Progress Notes (Addendum)
   Julie Boone is a 88 y.o. female who presents today for an office visit.  Assessment/Plan:  New/Acute Problems: Dizziness No red flags.  Does not have any current symptoms.  Her neurologic exam today is reassuring.  EKG did not show any ischemic changes or any arrhythmias.  History consistent with vertigo flare.  She does have a known history of vertigo that she usually takes meclizine to use as needed.  Given that she has no further symptoms and her exam today is reassuring, do not think we need to get any neuroimaging at this point.  Patient and daughter were agreeable to this.  We will check basic labs.  We discussed reasons to return to care and seek emergent care.  Follow-up as needed.  Chronic Problems Addressed Today: Essential hypertension Above goal today though typically well controlled.  We will continue lisinopril 20 mg daily and atenolol 75 mg daily.  Continue home monitoring    Subjective:  HPI:  Patient here with concern for an episode of dizziness and nausea Yesterday.  She was in her state of normal health watching a church service on television yesterday morning when she had sudden onset severe room spinning sensation.  This lasted for about 3 minutes before subsiding.  She does have a history of vertigo but has never had anything this severe in the past.  After they were spinning sensation she felt weak and nauseated.  Her daughter was also concerned that she felt weak and clammy.  Did not have any sort of chest pain or shortness of breath though did have some mild epigastric pain.  Remainder of her symptoms resolved after about 30 minutes or so.  Patient took an EKG on an Apple watch which showed concern for possible atrial fibrillation.  She has not had any recurrence of symptoms today.  No weakness or numbness.  No vision changes.      Objective:  Physical Exam: Temp 97.8 F (36.6 C) (Temporal)   Ht '5\' 3"'$  (1.6 m)   Wt 166 lb (75.3 kg)   BMI 29.41 kg/m    Gen: No acute distress, resting comfortably  CV: Regular rate and rhythm with no murmurs appreciated Pulm: Normal work of breathing, clear to auscultation bilaterally with no crackles, wheezes, or rhonchi Neuro: Cranial nerves II through XII intact.  Finger-nose-finger testing intact bilaterally.  Strength out of 5 in upper and lower extremities.  Sensation light touch intact throughout. Psych: Normal affect and thought content  EKG: NSR. No acute ischemic changes.   Time Spent: 40 minutes of total time was spent on the date of the encounter performing the following actions: chart review prior to seeing the patient, obtaining history, performing a medically necessary exam, counseling on the treatment plan including need for labs, placing orders, and documenting in our EHR.        [name]. Jerline Pain, MD 07/21/2021 12:05 PM

## 2021-07-22 NOTE — Progress Notes (Signed)
Please inform patient of the following:  Good news! Her labs are all stable.  [name]. Jerline Pain, MD 07/22/2021 9:53 AM

## 2021-07-30 ENCOUNTER — Other Ambulatory Visit: Payer: Self-pay | Admitting: Adult Health

## 2021-07-30 ENCOUNTER — Other Ambulatory Visit: Payer: Self-pay | Admitting: Internal Medicine

## 2021-08-01 ENCOUNTER — Telehealth: Payer: Medicare Other

## 2021-08-01 MED ORDER — MECLIZINE HCL 25 MG PO TABS: ORAL_TABLET | ORAL | 0 refills | Status: DC

## 2021-08-05 ENCOUNTER — Ambulatory Visit (INDEPENDENT_AMBULATORY_CARE_PROVIDER_SITE_OTHER): Payer: Medicare Other

## 2021-08-05 VITALS — BP 124/64 | HR 79 | Temp 97.0°F | Ht 61.5 in | Wt 162.7 lb

## 2021-08-05 DIAGNOSIS — Z0000 Encounter for general adult medical examination without abnormal findings: Secondary | ICD-10-CM

## 2021-08-05 NOTE — Progress Notes (Signed)
Subjective:   Julie Boone is a 88 y.o. female who presents for Medicare Annual (Subsequent) preventive examination.  Review of Systems     Cardiac Risk Factors include: advanced age (>2mn, >>80women);hypertension;obesity (BMI >30kg/m2)     Objective:    Today's Vitals   08/05/21 1510  BP: 124/64  Pulse: 79  Temp: (!) 97 F (36.1 C)  TempSrc: Oral  SpO2: 94%  Weight: 162 lb 11.2 oz (73.8 kg)  Height: 5' 1.5" (1.562 m)   Body mass index is 30.24 kg/m.     08/05/2021    3:25 PM 12/16/2020    5:58 AM 12/09/2020   10:55 AM 07/30/2020    3:40 PM 02/27/2019    2:17 PM 11/23/2017    3:13 PM 03/24/2017    1:04 AM  Advanced Directives  Does Patient Have a Medical Advance Directive? Yes Yes Yes Yes Yes Yes No;Yes  Type of [place]aramed[place]of [place]ains[place] Center[place] will Healthcare Power of Attorney Living will;Healthcare Power of AParcof ADona AnaLiving will  [place] will  Does patient want to make changes to medical advance directive?     No - Patient declined  No - Patient declined  Copy of HCadizin Chart? No - copy requested   No - copy requested No - copy requested  No - copy requested  Would patient like information on creating a medical advance directive?       No - Patient declined    Current Medications (verified) Outpatient Encounter Medications as of 08/05/2021  Medication Sig   apixaban (ELIQUIS) 5 MG TABS tablet Take 1 tablet (5 mg total) by mouth 2 (two) times daily.   atenolol (TENORMIN) 50 MG tablet TAKE 1 AND 1/2 TABLET BY MOUTH DAILY   Cholecalciferol (VITAMIN D-3) 125 MCG (5000 UT) TABS Take 1 tablet by mouth daily.    Cyanocobalamin (VITAMIN B12) 3000 MCG SUBL Place 1 tablet under the tongue daily.   lisinopril (ZESTRIL) 20 MG tablet Take 1 tablet (20 mg total) by mouth daily.   meclizine (ANTIVERT) 25 MG tablet 1/2 to 1  po up to every 8  hours as needed for vertigo  Fall precautions   hydrOXYzine (ATARAX/VISTARIL) 10 MG tablet Take 1-2 tablets (10-20 mg total) by mouth 3 (three) times daily as needed for itching. (Patient not taking: Reported on 07/21/2021)   Omega-3 Fatty Acids (FISH OIL) 500 MG CAPS Take 500 mg by mouth 3 (three) times a week. (Patient not taking: Reported on 08/05/2021)   ondansetron (ZOFRAN) 8 MG tablet Take 1 tablet (8 mg total) by mouth every 8 (eight) hours as needed for nausea or vomiting. (Patient not taking: Reported on 08/05/2021)   triamcinolone cream (KENALOG) 0.1 % Apply 1 application topically 2 (two) times daily. To rash (Patient not taking: Reported on 08/05/2021)   [place]: HSierra Leoneleg cramps ho meo pathix med  Takes as needed for leg cramps says it works well   No facility-administered encounter medications on file as of 08/05/2021.    Allergies (verified) Shellfish allergy and Morphine and related   History: Past Medical History:  Diagnosis Date   Acute sinusitis with symptoms greater than 10 days 06/23/2013   Arthritis    [place]ast cancer ([place]    mastectomy 3   neg ln    HOH (hard of hearing)    HTN (hypertension)    Wears glasses  Wears hearing aid    both ears   Past Surgical History:  Procedure Laterality Date   APPENDECTOMY  2006   BREAST BIOPSY Left 2012   COLONOSCOPY     HERNIA REPAIR     INSERTION OF MESH N/A 09/05/2013   Procedure: INSERTION OF MESH;  Surgeon: Joyice Faster. Cornett, MD;  Location: [facility];  Service: General;  Laterality: N/A;   MASTECTOMY Left 2013   TONSILLECTOMY AND ADENOIDECTOMY     Childhood   UMBILICAL HERNIA REPAIR N/A 09/05/2013   Procedure: HERNIA REPAIR UMBILICAL ADULT WITH MESH;  Surgeon: Joyice Faster. Cornett, MD;  Location: [facility];  Service: General;  Laterality: N/A;   Family History  Problem Relation Age of Onset   Hypertension Mother    Stroke Mother    Heart failure Mother    Lung disease  Sister        smoker   Colon cancer Father        dx in his late 74's   Esophageal cancer Neg Hx    Inflammatory bowel disease Neg Hx    Liver disease Neg Hx    Pancreatic cancer Neg Hx    Stomach cancer Neg Hx    Social History   Socioeconomic History   Marital status: Widowed    Spouse name: Not on file   Number of children: 1   Years of education: 12   Highest education level: High school graduate  Occupational History    Comment: retired  Tobacco Use   Smoking status: Never   Smokeless tobacco: Never  Vaping Use   Vaping Use: Never used  Substance and Sexual Activity   Alcohol use: Yes    Alcohol/week: 0.0 standard drinks    Comment: 1 time a month. has a sip ever now and then    Drug use: No   Sexual activity: Not on file  Other Topics Concern   Not on file  Social History Narrative   2 people living in the home   Dog Small yorkie and [place]   From [place] moved from [place]  Adopted daughter nearby.      Neg ets  Wine with meals  No tob rd.   G0P0   Married husband [place] background  Herself [place] descent.   Husband passed CHF and prostate cancer 2017   Now living with daughter         Social Determinants of Health   Financial Resource Strain: Low Risk    Difficulty of Paying Living Expenses: Not hard at all  Food Insecurity: No Food Insecurity   Worried About Charity fundraiser in the Last Year: Never true   Arboriculturist in the Last Year: Never true  Transportation Needs: No Transportation Needs   Lack of Transportation (Medical): No   Lack of Transportation (Non-Medical): No  Physical Activity: Inactive   Days of Exercise per Week: 0 days   Minutes of Exercise per Session: 0 min  Stress: No Stress Concern Present   Feeling of Stress : Not at all  Social Connections: Not on file    Tobacco Counseling Counseling given: Not Answered   Clinical Intake:  Pre-visit preparation completed: Yes  Pain : No/denies pain     Nutritional  Status: BMI > 30  Obese Nutritional Risks: None Diabetes: No  How often do you need to have someone help you when you read instructions, pamphlets, or other written materials from your doctor or  pharmacy?: 1 - Never  Diabetic? no  Interpreter Needed?: No  Information entered by :: NAllen LPN   Activities of Daily Living    08/05/2021    3:27 PM  In your present state of health, do you have any difficulty performing the following activities:  Hearing? 1  Comment has hearing aids  Vision? 0  Difficulty concentrating or making decisions? 0  Walking or climbing stairs? 0  Dressing or bathing? 0  Doing errands, shopping? 0  Preparing Food and eating ? N  Using the Toilet? N  In the past six months, have you accidently leaked urine? N  Do you have problems with loss of bowel control? N  Managing your Medications? N  Managing your Finances? N  Housekeeping or managing your Housekeeping? N    Patient Care Team: Panosh, Standley Brooking, MD as PCP - General (Internal Medicine) Elouise Munroe, MD as PCP - Cardiology (Cardiology) Erroll Luna, MD as Consulting Physician (General Surgery) Inocencio Homes, DPM as Consulting Physician (Podiatry) Earnie Larsson, [facility] as Pharmacist (Pharmacist)  Indicate any recent Medical Services you may have received from other than [facility] providers in the past year (date may be approximate).     Assessment:   This is a routine wellness examination for [place].  Hearing/Vision screen Vision Screening - Comments:: Regular eye exams, [facility]  Dietary issues and exercise activities discussed: Current Exercise Habits: The patient does not participate in regular exercise at present   Goals Addressed             This Visit's Progress    Patient Stated       08/05/2021, remain independent and stay healthy       Depression Screen    08/05/2021    3:26 PM 07/21/2021   11:50 AM 07/30/2020    3:33 PM 05/21/2020   10:04 AM 02/27/2019     2:18 PM 11/23/2017    3:15 PM 12/02/2016   10:59 AM  PHQ 2/9 Scores  PHQ - 2 Score 0 0 0 0 0 1 0    Fall Risk    08/05/2021    3:26 PM 07/21/2021   11:50 AM 07/30/2020    3:42 PM 05/21/2020   10:04 AM 02/27/2019    2:18 PM  Fall Risk   Falls in the past year? 0 0 0 0 0  Number falls in past yr: 0 0 0 0   Injury with Fall? 0 0 0 0   Risk for fall due to : Medication side effect No Fall Risks Impaired vision;Impaired balance/gait    Follow up Falls evaluation completed;Education provided;Falls prevention discussed  Falls prevention discussed      FALL RISK PREVENTION PERTAINING TO THE HOME:  Any stairs in or around the home? No  If so, are there any without handrails?  N/a Home free of loose throw rugs in walkways, pet beds, electrical cords, etc? Yes  Adequate lighting in your home to reduce risk of falls? Yes   ASSISTIVE DEVICES UTILIZED TO PREVENT FALLS:  Life alert? No  Use of a cane, walker or w/c? Yes  Grab bars in the bathroom? Yes  Shower chair or bench in shower? Yes  Elevated toilet seat or a handicapped toilet? Yes   TIMED UP AND GO:  Was the test performed? No .    Gait slow and steady with assistive device  Cognitive Function:        08/05/2021    3:29 PM 07/30/2020  3:49 PM 02/27/2019    2:21 PM  6CIT Screen  What Year? 0 points 0 points 0 points  What month? 0 points 0 points 0 points  What time? 0 points 0 points 0 points  Count back from 20 0 points 0 points 0 points  Months in reverse 0 points 0 points 0 points  Repeat phrase 10 points 0 points 0 points  Total Score 10 points 0 points 0 points    Immunizations Immunization History  Administered Date(s) Administered   PFIZER(Purple Top)SARS-COV-2 Vaccination 03/22/2019, 04/10/2019, 02/21/2020    TDAP status: Due, Education has been provided regarding the importance of this vaccine. Advised may receive this vaccine at local pharmacy or Health Dept. Aware to provide a copy of the vaccination  record if obtained from local pharmacy or Health Dept. Verbalized acceptance and understanding.  Flu Vaccine status: Declined, Education has been provided regarding the importance of this vaccine but patient still declined. Advised may receive this vaccine at local pharmacy or Health Dept. Aware to provide a copy of the vaccination record if obtained from local pharmacy or Health Dept. Verbalized acceptance and understanding.  Pneumococcal vaccine status: Declined,  Education has been provided regarding the importance of this vaccine but patient still declined. Advised may receive this vaccine at local pharmacy or Health Dept. Aware to provide a copy of the vaccination record if obtained from local pharmacy or Health Dept. Verbalized acceptance and understanding.   Covid-19 vaccine status: Completed vaccines  Qualifies for Shingles Vaccine? Yes   Zostavax completed No   Shingrix Completed?: No.    Education has been provided regarding the importance of this vaccine. Patient has been advised to call insurance company to determine out of pocket expense if they have not yet received this vaccine. Advised may also receive vaccine at local pharmacy or Health Dept. Verbalized acceptance and understanding.  Screening Tests Health Maintenance  Topic Date Due   COVID-19 Vaccine (4 - Booster for Pfizer series) 04/17/2020   Zoster Vaccines- Shingrix (1 of 2) 11/05/2021 (Originally 03/25/1943)   Pneumonia Vaccine 77+ Years old (1 - PCV) 08/06/2022 (Originally 03/26/1989)   MAMMOGRAM  08/06/2022 (Originally 02/15/2018)   TETANUS/TDAP  08/06/2022 (Originally 03/25/1943)   INFLUENZA VACCINE  09/30/2021   DEXA SCAN  Completed   HPV VACCINES  Aged Out    Health Maintenance  Health Maintenance Due  Topic Date Due   COVID-19 Vaccine (4 - Booster for [place] series) 04/17/2020    Colorectal cancer screening: No longer required.   Mammogram status: No longer required due to age.  Bone Density status:  Completed 02/10/2010.   Lung Cancer Screening: (Low Dose CT Chest recommended if Age 72-80 years, 30 pack-year currently smoking OR have quit w/in 15years.) does not qualify.   Lung Cancer Screening Referral: no  Additional Screening:  Hepatitis C Screening: does not qualify;   Vision Screening: Recommended annual ophthalmology exams for early detection of glaucoma and other disorders of the eye. Is the patient up to date with their annual eye exam?  Yes  Who is the provider or what is the name of the office in which the patient attends annual eye exams? [facility] If pt is not established with a provider, would they like to be referred to a provider to establish care? No .   Dental Screening: Recommended annual dental exams for proper oral hygiene  Community Resource Referral / Chronic Care Management: CRR required this visit?  No   CCM required this visit?  No      Plan:     I have personally reviewed and noted the following in the patient's chart:   Medical and social history Use of alcohol, tobacco or illicit drugs  Current medications and supplements including opioid prescriptions.  Functional ability and status Nutritional status Physical activity Advanced directives List of other physicians Hospitalizations, surgeries, and ER visits in previous 12 months Vitals Screenings to include cognitive, depression, and falls Referrals and appointments  In addition, I have reviewed and discussed with patient certain preventive protocols, quality metrics, and best practice recommendations. A written personalized care plan for preventive services as well as general preventive health recommendations were provided to patient.     Kellie Simmering, LPN   0/05/1279   Nurse Notes: none

## 2021-08-05 NOTE — Patient Instructions (Signed)
Julie Boone , Thank you for taking time to come for your Medicare Wellness Visit. I appreciate your ongoing commitment to your health goals. Please review the following plan we discussed and let me know if I can assist you in the future.   Screening recommendations/referrals: Colonoscopy: not required Mammogram: not required Bone Density: completed 02/10/2010 Recommended yearly ophthalmology/optometry visit for glaucoma screening and checkup Recommended yearly dental visit for hygiene and checkup  Vaccinations: Influenza vaccine: decline Pneumococcal vaccine: n/a Tdap vaccine: decline Shingles vaccine: decline   Covid-19: 02/21/2020, 04/10/2019, 03/22/2019  Advanced directives: Please bring a copy of your POA (Power of Attorney) and/or Living Will to your next appointment.    Conditions/risks identified: none  Next appointment: Follow up in one year for your annual wellness visit    Preventive Care 65 Years and Older, Female Preventive care refers to lifestyle choices and visits with your health care provider that can promote health and wellness. What does preventive care include? A yearly physical exam. This is also called an annual well check. Dental exams once or twice a year. Routine eye exams. Ask your health care provider how often you should have your eyes checked. Personal lifestyle choices, including: Daily care of your teeth and gums. Regular physical activity. Eating a healthy diet. Avoiding tobacco and drug use. Limiting alcohol use. Practicing safe sex. Taking low-dose aspirin every day. Taking vitamin and mineral supplements as recommended by your health care provider. What happens during an annual well check? The services and screenings done by your health care provider during your annual well check will depend on your age, overall health, lifestyle risk factors, and family history of disease. Counseling  Your health care provider may ask you questions about  your: Alcohol use. Tobacco use. Drug use. Emotional well-being. Home and relationship well-being. Sexual activity. Eating habits. History of falls. Memory and ability to understand (cognition). Work and work Statistician. Reproductive health. Screening  You may have the following tests or measurements: Height, weight, and BMI. Blood pressure. Lipid and cholesterol levels. These may be checked every 5 years, or more frequently if you are over 34 years old. Skin check. Lung cancer screening. You may have this screening every year starting at age 43 if you have a 30-pack-year history of smoking and currently smoke or have quit within the past 15 years. Fecal occult blood test (FOBT) of the stool. You may have this test every year starting at age 48. Flexible sigmoidoscopy or colonoscopy. You may have a sigmoidoscopy every 5 years or a colonoscopy every 10 years starting at age 44. Hepatitis C blood test. Hepatitis B blood test. Sexually transmitted disease (STD) testing. Diabetes screening. This is done by checking your blood sugar (glucose) after you have not eaten for a while (fasting). You may have this done every 1-3 years. Bone density scan. This is done to screen for osteoporosis. You may have this done starting at age 32. Mammogram. This may be done every 1-2 years. Talk to your health care provider about how often you should have regular mammograms. Talk with your health care provider about your test results, treatment options, and if necessary, the need for more tests. Vaccines  Your health care provider may recommend certain vaccines, such as: Influenza vaccine. This is recommended every year. Tetanus, diphtheria, and acellular pertussis (Tdap, Td) vaccine. You may need a Td booster every 10 years. Zoster vaccine. You may need this after age 67. Pneumococcal 13-valent conjugate (PCV13) vaccine. One dose is recommended after age  86. Pneumococcal polysaccharide (PPSV23) vaccine.  One dose is recommended after age 46. Talk to your health care provider about which screenings and vaccines you need and how often you need them. This information is not intended to replace advice given to you by your health care provider. Make sure you discuss any questions you have with your health care provider. Document Released: 03/15/2015 Document Revised: 11/06/2015 Document Reviewed: 12/18/2014 Elsevier Interactive Patient Education  2017 [place] Prevention in the Home Falls can cause injuries. They can happen to people of all ages. There are many things you can do to make your home safe and to help prevent falls. What can I do on the outside of my home? Regularly fix the edges of walkways and driveways and fix any cracks. Remove anything that might make you trip as you walk through a door, such as a raised step or threshold. Trim any bushes or trees on the path to your home. Use bright outdoor lighting. Clear any walking paths of anything that might make someone trip, such as rocks or tools. Regularly check to see if handrails are loose or broken. Make sure that both sides of any steps have handrails. Any raised decks and porches should have guardrails on the edges. Have any leaves, snow, or ice cleared regularly. Use sand or salt on walking paths during winter. Clean up any spills in your garage right away. This includes oil or grease spills. What can I do in the bathroom? Use night lights. Install grab bars by the toilet and in the tub and shower. Do not use towel bars as grab bars. Use non-skid mats or decals in the tub or shower. If you need to sit down in the shower, use a plastic, non-slip stool. Keep the floor dry. Clean up any water that spills on the floor as soon as it happens. Remove soap buildup in the tub or shower regularly. Attach bath mats securely with double-sided non-slip rug tape. Do not have throw rugs and other things on the floor that can make  you trip. What can I do in the bedroom? Use night lights. Make sure that you have a light by your bed that is easy to reach. Do not use any sheets or blankets that are too big for your bed. They should not hang down onto the floor. Have a firm chair that has side arms. You can use this for support while you get dressed. Do not have throw rugs and other things on the floor that can make you trip. What can I do in the kitchen? Clean up any spills right away. Avoid walking on wet floors. Keep items that you use a lot in easy-to-reach places. If you need to reach something above you, use a strong step stool that has a grab bar. Keep electrical cords out of the way. Do not use floor polish or wax that makes floors slippery. If you must use wax, use non-skid floor wax. Do not have throw rugs and other things on the floor that can make you trip. What can I do with my stairs? Do not leave any items on the stairs. Make sure that there are handrails on both sides of the stairs and use them. Fix handrails that are broken or loose. Make sure that handrails are as long as the stairways. Check any carpeting to make sure that it is firmly attached to the stairs. Fix any carpet that is loose or worn. Avoid having throw rugs at  the top or bottom of the stairs. If you do have throw rugs, attach them to the floor with carpet tape. Make sure that you have a light switch at the top of the stairs and the bottom of the stairs. If you do not have them, ask someone to add them for you. What else can I do to help prevent falls? Wear shoes that: Do not have high heels. Have rubber bottoms. Are comfortable and fit you well. Are closed at the toe. Do not wear sandals. If you use a stepladder: Make sure that it is fully opened. Do not climb a closed stepladder. Make sure that both sides of the stepladder are locked into place. Ask someone to hold it for you, if possible. Clearly mark and make sure that you can  see: Any grab bars or handrails. First and last steps. Where the edge of each step is. Use tools that help you move around (mobility aids) if they are needed. These include: Canes. Walkers. Scooters. Crutches. Turn on the lights when you go into a dark area. Replace any light bulbs as soon as they burn out. Set up your furniture so you have a clear path. Avoid moving your furniture around. If any of your floors are uneven, fix them. If there are any pets around you, be aware of where they are. Review your medicines with your doctor. Some medicines can make you feel dizzy. This can increase your chance of falling. Ask your doctor what other things that you can do to help prevent falls. This information is not intended to replace advice given to you by your health care provider. Make sure you discuss any questions you have with your health care provider. Document Released: 12/13/2008 Document Revised: 07/25/2015 Document Reviewed: 03/23/2014 Elsevier Interactive Patient Education  2017 [employer].

## 2021-08-31 ENCOUNTER — Emergency Department (HOSPITAL_BASED_OUTPATIENT_CLINIC_OR_DEPARTMENT_OTHER)
Admission: EM | Admit: 2021-08-31 | Discharge: 2021-08-31 | Disposition: A | Discharge status: Home / Self Care | Payer: Medicare Other | Attending: Emergency Medicine | Admitting: Emergency Medicine

## 2021-08-31 ENCOUNTER — Other Ambulatory Visit: Payer: Self-pay

## 2021-08-31 ENCOUNTER — Encounter (HOSPITAL_BASED_OUTPATIENT_CLINIC_OR_DEPARTMENT_OTHER): Payer: Self-pay

## 2021-08-31 ENCOUNTER — Other Ambulatory Visit: Payer: Self-pay | Admitting: Internal Medicine

## 2021-08-31 DIAGNOSIS — Z7901 Long term (current) use of anticoagulants: Secondary | ICD-10-CM | POA: Diagnosis not present

## 2021-08-31 DIAGNOSIS — M5432 Sciatica, left side: Secondary | ICD-10-CM | POA: Insufficient documentation

## 2021-08-31 DIAGNOSIS — I10 Essential (primary) hypertension: Secondary | ICD-10-CM

## 2021-08-31 DIAGNOSIS — M79605 Pain in left leg: Secondary | ICD-10-CM | POA: Diagnosis present

## 2021-08-31 MED ORDER — OXYCODONE-ACETAMINOPHEN 5-325 MG PO TABS: 1.0000 | ORAL_TABLET | Freq: Four times a day (QID) | ORAL | 0 refills | Status: DC | PRN

## 2021-08-31 MED ORDER — DEXAMETHASONE 4 MG PO TABS
6.0000 mg | ORAL_TABLET | Freq: Once | ORAL | Status: AC
  Administered 2021-08-31: 6 mg via ORAL
  Filled 2021-08-31: qty 2

## 2021-08-31 MED ORDER — ONDANSETRON 4 MG PO TBDP
4.0000 mg | ORAL_TABLET | Freq: Once | ORAL | Status: AC
  Administered 2021-08-31: 4 mg via ORAL
  Filled 2021-08-31: qty 1

## 2021-08-31 MED ORDER — ONDANSETRON 4 MG PO TBDP: 4.0000 mg | ORAL_TABLET | Freq: Three times a day (TID) | ORAL | 0 refills | Status: AC | PRN

## 2021-08-31 MED ORDER — OXYCODONE-ACETAMINOPHEN 5-325 MG PO TABS
1.0000 | ORAL_TABLET | Freq: Once | ORAL | Status: AC
  Administered 2021-08-31: 1 via ORAL
  Filled 2021-08-31: qty 1

## 2021-08-31 NOTE — ED Provider Notes (Signed)
[facility] EMERGENC[facility] DEPT Provider Note   CSN: [id number] Arrival date & time: 08/31/21  3818     History  Chief Complaint  Patient presents with   Leg Pain    Julie Boone is a 88 y.o. female.  Patient presents chief complaint of left leg pain.  Describes it as a tingling sensation running down from the left buttock all the way down to the calf.  This been going on for 4 days.  Is worse when she makes certain motions or movements.  Improved with rest.  Overall she states has been present most of the 4 days.  Denies any specific fall or trauma.  No fevers no cough positive nausea.  No diarrhea.  She has been taking Tylenol without improvement took a tramadol she had from a prior injury without improvement.       Home Medications Prior to Admission medications   Medication Sig Start Date End Date Taking? Authorizing Provider  ondansetron (ZOFRAN-ODT) 4 MG disintegrating tablet Take 1 tablet (4 mg total) by mouth every 8 (eight) hours as needed for nausea or vomiting. 08/31/21  Yes Cyerra Yim, Greggory Brandy, MD  oxyCODONE-acetaminophen (PERCOCET/ROXICET) 5-325 MG tablet Take 1 tablet by mouth every 6 (six) hours as needed for up to 8 doses for severe pain. 08/31/21  Yes Glennice Marcos, Greggory Brandy, MD  apixaban (ELIQUIS) 5 MG TABS tablet Take 1 tablet (5 mg total) by mouth 2 (two) times daily. 03/13/21   Elouise Munroe, MD  atenolol (TENORMIN) 50 MG tablet TAKE 1 AND 1/2 TABLET BY MOUTH DAILY 07/07/21   Panosh, Standley Brooking, MD  Cholecalciferol (VITAMIN D-3) 125 MCG (5000 UT) TABS Take 1 tablet by mouth daily.     [provider]  Cyanocobalamin (VITAMIN B12) 3000 MCG SUBL Place 1 tablet under the tongue daily.    [provider]  hydrOXYzine (ATARAX/VISTARIL) 10 MG tablet Take 1-2 tablets (10-20 mg total) by mouth 3 (three) times daily as needed for itching. Patient not taking: Reported on 07/21/2021 09/09/20   Burnis Medin, MD  lisinopril (ZESTRIL) 20 MG tablet Take 1  tablet (20 mg total) by mouth daily. 06/03/21   Panosh, Standley Brooking, MD  meclizine (ANTIVERT) 25 MG tablet 1/2 to 1  po up to every 8 hours as needed for vertigo  Fall precautions 08/01/21   Panosh, Standley Brooking, MD  Omega-3 Fatty Acids (FISH OIL) 500 MG CAPS Take 500 mg by mouth 3 (three) times a week. Patient not taking: Reported on 08/05/2021    [provider]  triamcinolone cream (KENALOG) 0.1 % Apply 1 application topically 2 (two) times daily. To rash Patient not taking: Reported on 08/05/2021 09/09/20   Panosh, Standley Brooking, MD  UNABLE TO FIND Med Name: [facility] leg cramps ho meo pathix med  Takes as needed for leg cramps says it works well    [provider]      Allergies    Shellfish allergy and Morphine and related    Review of Systems   Review of Systems  Constitutional:  Negative for fever.  HENT:  Negative for ear pain.   Eyes:  Negative for pain.  Respiratory:  Negative for cough.   Cardiovascular:  Negative for chest pain.  Gastrointestinal:  Negative for abdominal pain.  Genitourinary:  Negative for flank pain.  Musculoskeletal:  Negative for back pain.  Skin:  Negative for rash.  Neurological:  Negative for headaches.    Physical Exam Updated Vital Signs BP [name])  166/77 (BP Location: Right Arm)   Pulse 77   Temp 98 F (36.7 C) (Oral)   Resp 20   Ht '5\' 2"'$  (1.575 m)   Wt 74.4 kg   SpO2 97%   BMI 30.00 kg/m  Physical Exam Constitutional:      General: She is not in acute distress.    Appearance: Normal appearance.  HENT:     Head: Normocephalic.     Nose: Nose normal.  Eyes:     Extraocular Movements: Extraocular movements intact.  Cardiovascular:     Rate and Rhythm: Normal rate.  Pulmonary:     Effort: Pulmonary effort is normal.  Musculoskeletal:        General: Normal range of motion.     Cervical back: Normal range of motion.     Comments: Normal range of motion of the left lower extremity.  On visual inspection there is no deformity noted.   Normal range of motion of bilateral hips knees and ankles without exacerbation of pain.  Patient able to stand and weight-bear with mild discomfort.  Dorsalis pedis and posterior popliteal pulses are 2+ bilateral lower extremities.  Bilateral lower extremities appear well-perfused with normal warmth.  Compartments are soft.  No bony tenderness elicited.  No midline C or T or L-spine tenderness noted.  Mild to moderate L3-4 left paraspinal tenderness noted.  Neurological:     General: No focal deficit present.     Mental Status: She is alert. Mental status is at baseline.     ED Results / Procedures / Treatments   Labs (all labs ordered are listed, but only abnormal results are displayed) Labs Reviewed - No data to display  EKG None  Radiology No results found.  Procedures Procedures    Medications Ordered in ED Medications  ondansetron (ZOFRAN-ODT) disintegrating tablet 4 mg (has no administration in time range)  oxyCODONE-acetaminophen (PERCOCET/ROXICET) 5-325 MG per tablet 1 tablet (has no administration in time range)  dexamethasone (DECADRON) tablet 6 mg (has no administration in time range)    ED Course/ Medical Decision Making/ A&P                           Medical Decision Making Risk Prescription drug management.   History obtained from family at bedside.  Review of record shows office visit Jul 21, 2021 for abnormal EKG.  Cardiac monitoring showing sinus rhythm.    Given history and exam, I feel this is more consistent with sciatica.  Aortic etiology and urinary tract infection considered, however symptoms appear more consistent with sciatica.  Given medications here with improvement, will recommend outpatient follow-up with her doctor in 3 to 4 days.  Recommending immediate return for worsening symptoms or any additional concerns.        Final Clinical Impression(s) / ED Diagnoses Final diagnoses:  Sciatica of left side    Rx / DC Orders ED  Discharge Orders          Ordered    oxyCODONE-acetaminophen (PERCOCET/ROXICET) 5-325 MG tablet  Every 6 hours PRN        08/31/21 0743    ondansetron (ZOFRAN-ODT) 4 MG disintegrating tablet  Every 8 hours PRN        08/31/21 0743              Luna Fuse, MD 08/31/21 [phone]

## 2021-08-31 NOTE — ED Triage Notes (Signed)
She c/o non-traumatic pain "my whole left leg" sinc Thurs. (Three days). She is in no distress and her daughter is with  her.

## 2021-08-31 NOTE — Discharge Instructions (Signed)
Call your primary care doctor or specialist as discussed in the next 2-3 days.   Return immediately back to the ER if:  Your symptoms worsen within the next 12-24 hours. You develop new symptoms such as new fevers, persistent vomiting, new pain, shortness of breath, or new weakness or numbness, or if you have any other concerns.  

## 2021-09-02 ENCOUNTER — Emergency Department (HOSPITAL_BASED_OUTPATIENT_CLINIC_OR_DEPARTMENT_OTHER): Payer: Medicare Other

## 2021-09-02 ENCOUNTER — Emergency Department (HOSPITAL_BASED_OUTPATIENT_CLINIC_OR_DEPARTMENT_OTHER)
Admission: EM | Admit: 2021-09-02 | Discharge: 2021-09-02 | Disposition: A | Discharge status: Home / Self Care | Payer: Medicare Other | Attending: Emergency Medicine | Admitting: Emergency Medicine

## 2021-09-02 ENCOUNTER — Other Ambulatory Visit: Payer: Self-pay

## 2021-09-02 ENCOUNTER — Emergency Department (HOSPITAL_BASED_OUTPATIENT_CLINIC_OR_DEPARTMENT_OTHER): Payer: Medicare Other | Admitting: Radiology

## 2021-09-02 ENCOUNTER — Encounter (HOSPITAL_BASED_OUTPATIENT_CLINIC_OR_DEPARTMENT_OTHER): Payer: Self-pay | Admitting: Radiology

## 2021-09-02 DIAGNOSIS — M5442 Lumbago with sciatica, left side: Secondary | ICD-10-CM | POA: Insufficient documentation

## 2021-09-02 DIAGNOSIS — R109 Unspecified abdominal pain: Secondary | ICD-10-CM | POA: Insufficient documentation

## 2021-09-02 DIAGNOSIS — M5441 Lumbago with sciatica, right side: Secondary | ICD-10-CM | POA: Insufficient documentation

## 2021-09-02 DIAGNOSIS — M545 Low back pain, unspecified: Secondary | ICD-10-CM | POA: Diagnosis present

## 2021-09-02 LAB — BASIC METABOLIC PANEL
Anion gap: 13 (ref 5–15)
BUN: 28 mg/dL — ABNORMAL HIGH (ref 8–23)
CO2: 21 mmol/L — ABNORMAL LOW (ref 22–32)
Calcium: 9.5 mg/dL (ref 8.9–10.3)
Chloride: 102 mmol/L (ref 98–111)
Creatinine, Ser: 1.19 mg/dL — ABNORMAL HIGH (ref 0.44–1.00)
GFR, Estimated: 42 mL/min — ABNORMAL LOW (ref >60)
Glucose, Bld: 99 mg/dL (ref 70–99)
Potassium: 4.9 mmol/L (ref 3.5–5.1)
Sodium: 136 mmol/L (ref 135–145)

## 2021-09-02 LAB — CBC
HCT: 43.2 % (ref 36.0–46.0)
Hemoglobin: 14.2 g/dL (ref 12.0–15.0)
MCH: 31.9 pg (ref 26.0–34.0)
MCHC: 32.9 g/dL (ref 30.0–36.0)
MCV: 97.1 fL (ref 80.0–100.0)
Platelets: 184 10*3/uL (ref 150–400)
RBC: 4.45 MIL/uL (ref 3.87–5.11)
RDW: 12.4 % (ref 11.5–15.5)
WBC: 12.8 10*3/uL — ABNORMAL HIGH (ref 4.0–10.5)
nRBC: 0 % (ref 0.0–0.2)

## 2021-09-02 LAB — URINALYSIS, ROUTINE W REFLEX MICROSCOPIC
Bilirubin Urine: NEGATIVE
Glucose, UA: NEGATIVE mg/dL
Hgb urine dipstick: NEGATIVE
Ketones, ur: NEGATIVE mg/dL
Leukocytes,Ua: NEGATIVE
Nitrite: NEGATIVE
Protein, ur: NEGATIVE mg/dL
Specific Gravity, Urine: 1.019 (ref 1.005–1.030)
pH: 6 (ref 5.0–8.0)

## 2021-09-02 MED ORDER — IOHEXOL 300 MG/ML  SOLN
100.0000 mL | Freq: Once | INTRAMUSCULAR | Status: AC | PRN
  Administered 2021-09-02: 75 mL via INTRAVENOUS

## 2021-09-02 MED ORDER — PREDNISONE 10 MG PO TABS: 20.0000 mg | ORAL_TABLET | Freq: Every day | ORAL | 0 refills | Status: DC

## 2021-09-02 MED ORDER — LACTATED RINGERS IV BOLUS
750.0000 mL | Freq: Once | INTRAVENOUS | Status: AC
  Administered 2021-09-02: 750 mL via INTRAVENOUS

## 2021-09-02 MED ORDER — CYCLOBENZAPRINE HCL 5 MG PO TABS
5.0000 mg | ORAL_TABLET | Freq: Once | ORAL | Status: AC
  Administered 2021-09-02: 5 mg via ORAL
  Filled 2021-09-02: qty 1

## 2021-09-02 MED ORDER — CYCLOBENZAPRINE HCL 10 MG PO TABS: 10.0000 mg | ORAL_TABLET | Freq: Two times a day (BID) | ORAL | 0 refills | Status: DC | PRN

## 2021-09-02 NOTE — ED Notes (Signed)
23 mL on bladder scan

## 2021-09-02 NOTE — ED Notes (Signed)
Patient transported to CT 

## 2021-09-02 NOTE — ED Notes (Signed)
Back from CT

## 2021-09-02 NOTE — Discharge Instructions (Signed)
Please take medications as prescribed Turn if you are having any new weakness, numbness, tingling, loss of bowel or bladder control Please follow-up with Dr. Regis Bill tomorrow as scheduled

## 2021-09-02 NOTE — ED Triage Notes (Signed)
Pt comes to ED for a re-evaluation of bilateral leg pain ongoing since last Wednesday. No injury. Unable to sleep through the night. Taking Oxycodone without relief.

## 2021-09-02 NOTE — ED Provider Notes (Signed)
[facility] Provider Note   CSN: [id number] Arrival date & time: 09/02/21  0559     History  Chief Complaint  Patient presents with   Leg Pain    Julie Bo[facility] is a 88 y.o. female.  HPI 88 year old female presents today complaining of low back to bilateral hip pain left greater than right that began approximately 5 days ago.  She was seen here last night and started on some pain medicine.  She says helped somewhat but she continues to have pain.  She has had some urinary burning.  She denies any weakness, numbness, tingling, urinary retention or incontinency of stool     Home Medications Prior to Admission medications   Medication Sig Start Date End Date Taking? Authorizing Provider  cyclobenzaprine (FLEXERIL) 10 MG tablet Take 1 tablet (10 mg total) by mouth 2 (two) times daily as needed for muscle spasms. 09/02/21  Yes Pattricia Boss, MD  predniSONE (DELTASONE) 10 MG tablet Take 2 tablets (20 mg total) by mouth daily. 09/02/21  Yes Pattricia Boss, MD  apixaban (ELIQUIS) 5 MG TABS tablet Take 1 tablet (5 mg total) by mouth 2 (two) times daily. 03/13/21   Elouise Munroe, MD  atenolol (TENORMIN) 50 MG tablet TAKE 1 AND 1/2 TABLET BY MOUTH DAILY 07/07/21   Panosh, Standley Brooking, MD  Cholecalciferol (VITAMIN D-3) 125 MCG (5000 UT) TABS Take 1 tablet by mouth daily.     [provider]  Cyanocobalamin (VITAMIN B12) 3000 MCG SUBL Place 1 tablet under the tongue daily.    [provider]  hydrOXYzine (ATARAX/VISTARIL) 10 MG tablet Take 1-2 tablets (10-20 mg total) by mouth 3 (three) times daily as needed for itching. Patient not taking: Reported on 07/21/2021 09/09/20   Burnis Medin, MD  lisinopril (ZESTRIL) 20 MG tablet Take 1 tablet (20 mg total) by mouth daily. 06/03/21   Panosh, Standley Brooking, MD  meclizine (ANTIVERT) 25 MG tablet 1/2 to 1  po up to every 8 hours as needed for vertigo  Fall precautions 08/01/21   Panosh, Standley Brooking, MD  Omega-3 Fatty  Acids (FISH OIL) 500 MG CAPS Take 500 mg by mouth 3 (three) times a week. Patient not taking: Reported on 08/05/2021    [provider]  ondansetron (ZOFRAN-ODT) 4 MG disintegrating tablet Take 1 tablet (4 mg total) by mouth every 8 (eight) hours as needed for nausea or vomiting. 08/31/21   Luna Fuse, MD  oxyCODONE-acetaminophen (PERCOCET/ROXICET) 5-325 MG tablet Take 1 tablet by mouth every 6 (six) hours as needed for up to 8 doses for severe pain. 08/31/21   Luna Fuse, MD  triamcinolone cream (KENALOG) 0.1 % Apply 1 application topically 2 (two) times daily. To rash Patient not taking: Reported on 08/05/2021 09/09/20   Panosh, Standley Brooking, MD  UNABLE TO FIND Med Name: [facility] leg cramps ho meo pathix med  Takes as needed for leg cramps says it works well    [provider]      Allergies    Shellfish allergy and Morphine and related    Review of Systems   Review of Systems  Physical Exam Updated Vital Signs BP (!) 158/65 (BP Location: Left Arm)   Pulse 69   Temp 97.8 F (36.6 C)   Resp 18   SpO2 92%  Physical Exam Vitals and nursing note reviewed.  Constitutional:      Appearance: Normal appearance.  HENT:     Head: Normocephalic.  Right Ear: External ear normal.     Left Ear: External ear normal.     Nose: Nose normal.     Mouth/Throat:     Mouth: Mucous membranes are moist.  Eyes:     Pupils: Pupils are equal, round, and reactive to light.  Cardiovascular:     Rate and Rhythm: Normal rate and regular rhythm.     Comments: Dorsal pedalis pulses are intact bilaterally Pulmonary:     Effort: Pulmonary effort is normal.     Breath sounds: Normal breath sounds.     Comments: Status post left breast mastectomy Abdominal:     General: Abdomen is flat.  Musculoskeletal:     Cervical back: Normal range of motion and neck supple.  Skin:    General: Skin is warm and dry.     Capillary Refill: Capillary refill takes less than 2 seconds.  Neurological:      General: No focal deficit present.     Mental Status: She is alert.     Cranial Nerves: No cranial nerve deficit.     Sensory: No sensory deficit.     Motor: No weakness.     Coordination: Coordination normal.     Deep Tendon Reflexes: Reflexes normal.     Comments: No perineal numbness Strength is 5 out of 5 bilateral knee extensors and flexors 5 out of 5 bilateral ankle flexors and extensors 5 out of 5 bilateral hip flexors Sensation intact throughout lower extremities Pulses intact bilateral lower extremity  Psychiatric:        Mood and Affect: Mood normal.        Behavior: Behavior normal.     ED Results / Procedures / Treatments   Labs (all labs ordered are listed, but only abnormal results are displayed) Labs Reviewed  URINALYSIS, ROUTINE W REFLEX MICROSCOPIC - Abnormal; Notable for the following components:      Result Value   Color, Urine COLORLESS (*)    All other components within normal limits  CBC - Abnormal; Notable for the following components:   WBC 12.8 (*)    All other components within normal limits  BASIC METABOLIC PANEL - Abnormal; Notable for the following components:   CO2 21 (*)    BUN 28 (*)    Creatinine, Ser 1.19 (*)    GFR, Estimated 42 (*)    All other components within normal limits    EKG None  Radiology CT ABDOMEN PELVIS W CONTRAST  Result Date: 09/02/2021 CLINICAL DATA:  Abdominal pain. EXAM: CT ABDOMEN AND PELVIS WITH CONTRAST TECHNIQUE: Multidetector CT imaging of the abdomen and pelvis was performed using the standard protocol following bolus administration of intravenous contrast. RADIATION DOSE REDUCTION: This exam was performed according to the departmental dose-optimization program which includes automated exposure control, adjustment of the mA and/or kV according to patient size and/or use of iterative reconstruction technique. CONTRAST:  2m OMNIPAQUE IOHEXOL 300 MG/ML  SOLN COMPARISON:  11/28/2018 FINDINGS: Lower chest: Chronic  interstitial changes. Hepatobiliary: Stable tiny hypodensity posterior right liver, likely a cyst. Small area of low attenuation in the anterior liver, adjacent to the falciform ligament, is in a characteristic location for focal fatty deposition. There is no evidence for gallstones, gallbladder wall thickening, or pericholecystic fluid. No intrahepatic or extrahepatic biliary dilation. Pancreas: No focal mass lesion. No dilatation of the main duct. No intraparenchymal cyst. No peripancreatic edema. Spleen: No splenomegaly. No focal mass lesion. Adrenals/Urinary Tract: No adrenal nodule or mass. Bilateral renal cysts evident.  No hydronephrosis. No evidence for hydroureter. The urinary bladder appears normal for the degree of distention. Stomach/Bowel: Stomach is unremarkable. No gastric wall thickening. No evidence of outlet obstruction. Duodenum is normally positioned as is the ligament of Treitz. No small bowel wall thickening. No small bowel dilatation. The terminal ileum is normal. Nonvisualization of the appendix is consistent with the reported history of appendectomy. No gross colonic mass. No colonic wall thickening. A few scattered diverticuli are evident. Vascular/Lymphatic: There is moderate atherosclerotic calcification of the abdominal aorta without aneurysm. There is no gastrohepatic or hepatoduodenal ligament lymphadenopathy. No retroperitoneal or mesenteric lymphadenopathy. No pelvic sidewall lymphadenopathy. Reproductive: Unremarkable. Other: Trace free fluid is seen in the right pelvis. Musculoskeletal: No worrisome lytic or sclerotic osseous abnormality. IMPRESSION: 1. No acute findings in the abdomen or pelvis. Specifically, no findings to explain the patient's history of abdominal pain. 2. As noted on CT lumbar spine, there is trace free fluid in the pelvis and while unexpected in a 88 year old female, appearance is nonspecific. 3. Bilateral renal cysts. 4. Aortic Atherosclerosis (ICD10-I70.0).  Electronically Signed   By: Misty Stanley M.D.   On: 09/02/2021 11:03   DG Hips Bilat W or Wo Pelvis 2 Views  Result Date: 09/02/2021 CLINICAL DATA:  88 year old female with increased back and leg pain. No known injury. EXAM: DG HIP (WITH OR WITHOUT PELVIS) 2V BILAT COMPARISON:  Lumbar spine CT today. Pelvis and bilateral hip series 02/16/2017. FINDINGS: Pelvis appears stable and intact. Osteopenia. Femoral heads are normally located. Hip joint spaces are stable and normal for age. Bilateral proximal femurs appear stable and intact. No acute osseous abnormality identified. Chronic right lower quadrant surgical clips. Negative visible bowel gas. IMPRESSION: No acute osseous abnormality identified about the bilateral hips or pelvis. Electronically Signed   By: Genevie Ann M.D.   On: 09/02/2021 08:33   CT Lumbar Spine Wo Contrast  Result Date: 09/02/2021 CLINICAL DATA:  88 year old female with low back and bilateral leg pain since last week. History of breast cancer. EXAM: CT LUMBAR SPINE WITHOUT CONTRAST TECHNIQUE: Multidetector CT imaging of the lumbar spine was performed without intravenous contrast administration. Multiplanar CT image reconstructions were also generated. RADIATION DOSE REDUCTION: This exam was performed according to the departmental dose-optimization program which includes automated exposure control, adjustment of the mA and/or kV according to patient size and/or use of iterative reconstruction technique. COMPARISON:  CT Abdomen and Pelvis 11/28/2018. FINDINGS: Segmentation: Normal. Alignment: Chronic mild to moderate levoconvex lumbar scoliosis and lumbar lordosis appears stable since 2020. Mild chronic grade 1 anterolisthesis of L4 on L5. Vertebrae: Combined chronic widespread osteopenia with degenerative endplate sclerosis at W4-X3. New L5 anterior superior endplate sclerosis since 2020, but no significant loss of L5 vertebral body height. No acute osseous abnormality identified. Visible  sacrum and SI joints appear grossly intact. Paraspinal and other soft tissues: Normal caliber abdominal aorta. Aortoiliac calcified atherosclerosis. Negative visible other noncontrast abdominal viscera. There is trace nonspecific low-density free fluid in the pelvis now (series 4, image 128), but otherwise negative visible noncontrast pelvic viscera. Disc levels: T12-L1:  Stable disc space.  No stenosis. L1-L2: Stable disc space. Left eccentric circumferential disc bulging with no significant spinal stenosis. Stable mild left L1 foraminal stenosis. L2-L3: Chronic severe disc space loss. Increased vacuum disc since 2020. Circumferential disc osteophyte complex. Multifactorial mild spinal and mild to moderate L2 foraminal stenosis does not appear significantly changed. L3-L4: Chronic disc space loss and vacuum disc. Bulky circumferential disc bulge with moderate facet and ligament  flavum hypertrophy. Moderate to severe spinal stenosis here appears progressed. Stable mild L3 foraminal stenosis. L4-L5: Chronic anterolisthesis and vacuum disc. Partially calcified disc herniation or pseudo disc in the midline is new since 2020 (series 4, image 72). Bulky chronic facet and ligament flavum hypertrophy. Probable severe spinal stenosis appears progressed since 2020. With moderate to severe bilateral L4 foraminal stenosis suspected. L5-S1: Stable disc space. Chronic vacuum disc. But comparatively mild disc bulging and moderate facet hypertrophy. No spinal stenosis. Incidental chronic sacral Tarlov cysts (no follow-up imaging recommended). IMPRESSION: 1. Advanced osteopenia and lumbar spine degeneration but no acute osseous abnormality in the lumbar spine. 2. Suspect progressed multifactorial lumbar spinal stenosis since the 2020 CT at both L3-L4 and L4-L5. Moderate or severe associated bilateral L4 foraminal stenosis. 3. There is new trace free fluid in the pelvis. This is abnormal but nonspecific. No associated acute finding  of the visible noncontrast abdominal and pelvic viscera. 4. Aortic Atherosclerosis (ICD10-I70.0). Electronically Signed   By: Genevie Ann M.D.   On: 09/02/2021 08:32    Procedures Procedures    Medications Ordered in ED Medications  cyclobenzaprine (FLEXERIL) tablet 5 mg (5 mg Oral Given 09/02/21 0858)  lactated ringers bolus 750 mL (750 mLs Intravenous New Bag/Given 09/02/21 1032)  iohexol (OMNIPAQUE) 300 MG/ML solution 100 mL (75 mLs Intravenous Contrast Given 09/02/21 1008)    ED Course/ Medical Decision Making/ A&P Clinical Course as of 09/02/21 1214  Tue Sep 02, 2021  1209 CBC(!) CBC with mild leukocytosis of 12,800 otherwise within normal limit [DR]  6948 Basic metabolic panel(!) Basic metabolic panel reviewed interpreted and significant for mildly elevated creatinine at 1.19 which is relatively normal with age-adjusted [DR]  1210 Urinalysis, Routine w reflex microscopic Urine, Catheterized(!) Cath urinalysis obtained and is clear [DR]  1210 Plain hip x-rays obtained interpreted with no evidence of acute fracture or abnormality radiologist interpretation concurs [DR]  1210 CT lumbar spine significant for progressed multifactorial lumbar stenosis since 2020 at L3-L4 and L4-L5 moderate or severe associated bilateral L4 foraminal stenosis is noted [DR]    Clinical Course User Index [DR] Pattricia Boss, MD                           Medical Decision Making 88 year old female presents today with bilateral leg pain and some low back pain.  This been present for approximately 5 days and was fairly acute in onset patient does have a history of breast cancer. Differential diagnosis includes but is not limited to lumbar spine and disc disease including fracture, disc protrusion, degenerative joint disease, metastatic breast cancer, urinary tract infection. Patient was evaluated with CT scan of lumbar spine.  There was some free fluid noted in her pelvis.  Secondary to this CT of the abdomen and  pelvis were done with no obvious source of the fluid noted.  There is significant spinal stenosis throughout but no definite fracture or impingement of her spinal cord or metastatic disease. Urinalysis obtained and clear Patient's pain is most consistent with spinal stenosis.  However, given her age and comorbidities, it is difficult to definitively rule out other etiologies.  Patient will be started on steroids and continue muscle relaxants.  She has an appointment with her primary care physician Dr. Regis Bill tomorrow.  Patient and her daughter are advised regarding return precautions and need for close follow-up and voiced understanding.  Amount and/or Complexity of Data Reviewed Independent Historian:     Details: Daughter assisted  with history as patient is hard of hearing Labs: ordered. Decision-making details documented in ED Course. Radiology: ordered and independent interpretation performed. Decision-making details documented in ED Course.  Risk Prescription drug management.          Final Clinical Impression(s) / ED Diagnoses Final diagnoses:  Bilateral low back pain with bilateral sciatica, unspecified chronicity    Rx / DC Orders ED Discharge Orders          Ordered    predniSONE (DELTASONE) 10 MG tablet  Daily        09/02/21 1214    cyclobenzaprine (FLEXERIL) 10 MG tablet  2 times daily PRN        09/02/21 1214              Pattricia Boss, MD 09/02/21 1214

## 2021-09-03 ENCOUNTER — Ambulatory Visit (INDEPENDENT_AMBULATORY_CARE_PROVIDER_SITE_OTHER): Payer: Medicare Other | Admitting: Internal Medicine

## 2021-09-03 ENCOUNTER — Encounter: Payer: Self-pay | Admitting: Internal Medicine

## 2021-09-03 VITALS — BP 160/80 | HR 77 | Temp 98.5°F | Ht 62.0 in | Wt 163.6 lb

## 2021-09-03 DIAGNOSIS — Z79899 Other long term (current) drug therapy: Secondary | ICD-10-CM

## 2021-09-03 DIAGNOSIS — I10 Essential (primary) hypertension: Secondary | ICD-10-CM

## 2021-09-03 DIAGNOSIS — M4807 Spinal stenosis, lumbosacral region: Secondary | ICD-10-CM | POA: Diagnosis not present

## 2021-09-03 DIAGNOSIS — M79605 Pain in left leg: Secondary | ICD-10-CM

## 2021-09-03 DIAGNOSIS — M5417 Radiculopathy, lumbosacral region: Secondary | ICD-10-CM

## 2021-09-03 DIAGNOSIS — T887XXA Unspecified adverse effect of drug or medicament, initial encounter: Secondary | ICD-10-CM

## 2021-09-03 NOTE — Patient Instructions (Addendum)
I suspect that  the  pain has been from   spinal stenosis and back arthritis . Pinched nerves  The prednisone may help the inflammation of  nerve and   back disc.   Finish the prednisone . we can do a prednisone  taper.    20 mg  per day for total  of 3  days and then  15 mg per day for 2 days then 10 mg  .  X 2 days and off.  The flexeril at 10 mg could cause    to be very loopy  if needed would use only 2.5- 5 mg  dosing .   Bp is up today: check  when not in office.  Let me know how doing at end of prednisone taper.  If ongoin then plan would be to  see a spine.  specialist  or physiatrist.

## 2021-09-03 NOTE — Progress Notes (Signed)
Chief Complaint  Patient presents with   Hospitalization Follow-up    In hospital twice     HPI:  Julie Boone 88 y.o. come in with daughter Julie Boone for  fu ed x2 for leg pain?  7 2 abd  7 4  for leg pains bilateral and pain was described as diminished excruciating that she is never seen her mother in that much pain. On second evaluation had blood work abdominal CT spinal CT and hip x-rays. Noted to have significant spinal stenosis and disc protrusion but no other acute finding or lesions.  It was felt to have progressed over time since 2020. Given prednsiosne 10 mg twice daily dispense 14 week . Nausea from pain .  Gave left over zofran.   Given     cyclobenzaprin e 10 mg given and pred  was very loopy like talking out of her head and okk no narcotic as previous narcotic given made her sick.  Fortunately today her pain is down are almost gone and she was able to eat something.   Severe of ear pain was on the anterior left leg running up and down likes constant stabbing that was excruciating some went up to her buttocks area but not in the midline back..   Sever pain  yesterday .  As compared to the day [facility]   ROS: See pertinent positives and negatives per HPI.  No chest pain shortness of breath although she has "indigestion today.  Was eating better.  Past Medical History:  Diagnosis Date   Acute sinusitis with symptoms greater than 10 days 06/23/2013   Arthritis    Breast cancer ([place])    mastectomy 3   neg ln    HOH (hard of hearing)    HTN (hypertension)    Wears glasses    Wears hearing aid    both ears    Family History  Problem Relation Age of Onset   Hypertension Mother    Stroke Mother    Heart failure Mother    Lung disease Sister        smoker   Colon cancer Father        dx in his late 63's   Esophageal cancer Neg Hx    Inflammatory bowel disease Neg Hx    Liver disease Neg Hx    Pancreatic cancer Neg Hx    Stomach cancer Neg Hx     Social  History   Socioeconomic History   Marital status: Widowed    Spouse name: Not on file   Number of children: 1   Years of education: 12   Highest education level: High school graduate  Occupational History    Comment: retired  Tobacco Use   Smoking status: Never   Smokeless tobacco: Never  Vaping Use   Vaping Use: Never used  Substance and Sexual Activity   Alcohol use: Yes    Alcohol/week: 0.0 standard drinks of alcohol    Comment: 1 time a month. has a sip ever now and then    Drug use: No   Sexual activity: Not on file  Other Topics Concern   Not on file  Social History Narrative   2 people living in the home   Dog Small yorkie and Wallace   From [place] moved from [place]  Adopted daughter nearby.      Neg ets  Wine with meals  No tob rd.   G0P0   Married husband [place] background  Herself irish  descent.   Husband passed CHF and prostate cancer 2017   Now living with daughter         Social Determinants of Health   Financial Resource Strain: Low Risk  (08/05/2021)   Overall Financial Resource Strain (CARDIA)    Difficulty of Paying Living Expenses: Not hard at all  Food Insecurity: No Food Insecurity (08/05/2021)   Hunger Vital Sign    Worried About Running Out of Food in the Last Year: Never true    Ran Out of Food in the Last Year: Never true  Transportation Needs: No Transportation Needs (08/05/2021)   PRAPARE - Hydrologist (Medical): No    Lack of Transportation (Non-Medical): No  Physical Activity: Inactive (08/05/2021)   Exercise Vital Sign    Days of Exercise per Week: 0 days    Minutes of Exercise per Session: 0 min  Stress: No Stress Concern Present (08/05/2021)   [place]    Feeling of Stress : Not at all  Social Connections: Moderately Isolated (07/30/2020)   Social Connection and Isolation Panel [NHANES]    Frequency of Communication with Friends and Family:  More than three times a week    Frequency of Social Gatherings with Friends and Family: Twice a week    Attends Religious Services: 1 to 4 times per year    Active Member of [employer] or Organizations: No    Attends Archivist Meetings: Never    Marital Status: Widowed    Outpatient Medications Prior to Visit  Medication Sig Dispense Refill   apixaban (ELIQUIS) 5 MG TABS tablet Take 1 tablet (5 mg total) by mouth 2 (two) times daily. 180 tablet 1   atenolol (TENORMIN) 50 MG tablet TAKE 1 AND 1/2 TABLET BY MOUTH DAILY 135 tablet 0   Cholecalciferol (VITAMIN D-3) 125 MCG (5000 UT) TABS Take 1 tablet by mouth daily.      Cyanocobalamin (VITAMIN B12) 3000 MCG SUBL Place 1 tablet under the tongue daily.     cyclobenzaprine (FLEXERIL) 10 MG tablet Take 1 tablet (10 mg total) by mouth 2 (two) times daily as needed for muscle spasms. 20 tablet 0   hydrOXYzine (ATARAX/VISTARIL) 10 MG tablet Take 1-2 tablets (10-20 mg total) by mouth 3 (three) times daily as needed for itching. 40 tablet 0   lisinopril (ZESTRIL) 20 MG tablet TAKE ONE TABLET BY MOUTH DAILY 90 tablet 0   meclizine (ANTIVERT) 25 MG tablet 1/2 to 1  po up to every 8 hours as needed for vertigo  Fall precautions 20 tablet 0   Omega-3 Fatty Acids (FISH OIL) 500 MG CAPS Take 500 mg by mouth 3 (three) times a week.     ondansetron (ZOFRAN-ODT) 4 MG disintegrating tablet Take 1 tablet (4 mg total) by mouth every 8 (eight) hours as needed for nausea or vomiting. 20 tablet 0   oxyCODONE-acetaminophen (PERCOCET/ROXICET) 5-325 MG tablet Take 1 tablet by mouth every 6 (six) hours as needed for up to 8 doses for severe pain. 8 tablet 0   predniSONE (DELTASONE) 10 MG tablet Take 2 tablets (20 mg total) by mouth daily. 15 tablet 0   triamcinolone cream (KENALOG) 0.1 % Apply 1 application topically 2 (two) times daily. To rash 60 g 1   UNABLE TO FIND Med Name: [place] leg cramps ho meo pathix med  Takes as needed for leg cramps says it works  well     No  facility-administered medications prior to visit.     EXAM:  BP (!) 160/80   Pulse 77   Temp 98.5 F (36.9 C) (Oral)   Ht '5\' 2"'$  (1.575 m)   Wt 163 lb 9.6 oz (74.2 kg)   SpO2 98%   BMI 29.92 kg/m   Body mass index is 29.92 kg/m.  GENERAL: vitals reviewed and listed above, alert, oriented, appears well hydrated and in no acute distress HEENT: atraumatic, conjunctiva  clear, no obvious abnormalities on inspection of external nose and ears NECK: no obvious masses on inspection palpation  LUNGS: clear to auscultation bilaterally, no wheezes, rales or rhonchi, good air movement CV: HRRR, no clubbing cyanosis or  peripheral edema nl cap refill no midline spine tenderness able to walk gingerly somewhat flat-footed with holding on she cannot do toe raise and heel raise MS: moves all extremities PSYCH: pleasant and cooperative, no obvious depression or anxiety Lab Results  Component Value Date   WBC 12.8 (H) 09/02/2021   HGB 14.2 09/02/2021   HCT 43.2 09/02/2021   PLT 184 09/02/2021   GLUCOSE 99 09/02/2021   CHOL 187 05/21/2020   TRIG 176.0 (H) 05/21/2020   HDL 35.30 (L) 05/21/2020   LDLDIRECT 103.0 07/10/2019   LDLCALC 117 (H) 05/21/2020   ALT 14 07/21/2021   AST 20 07/21/2021   NA 136 09/02/2021   K 4.9 09/02/2021   CL 102 09/02/2021   CREATININE 1.19 (H) 09/02/2021   BUN 28 (H) 09/02/2021   CO2 21 (L) 09/02/2021   TSH 2.59 07/21/2021   INR 0.93 04/01/2018   HGBA1C 6.2 05/21/2020   BP Readings from Last 3 Encounters:  09/03/21 (!) 160/80  09/02/21 (!) 161/68  08/31/21 (!) 151/66    ASSESSMENT AND PLAN:  Discussed the following assessment and plan:  Spinal stenosis of lumbosacral region with radiculopathy  Leg pain, anterior, left  Medication management  Essential hypertension  Medication side effect - 10 mg of flexeril ?  limit to 2.5 5 mg if needed at all Significantly better whether it was going to happen or the prednisone was helpful.  And  suspect spinal stenosis related symptoms.  WBC was slightly up but unclear if this is before after prednisone and no signs of infection. She probably had a side effect of the Flexeril at 10 mg. Julie Boone will send in some blood pressure readings and how she is doing in a week See instructions is given to do a quick taper.  If recurrence of pain we can do a slower taper. In addition if persistent progressive can refer to physical medicine rehab or orthos neuro [facility] fortunately no other alarm symptoms today weakness lesions. -Patient advised to return or notify health care team  if  new concerns arise.  Patient Instructions  I suspect that  the  pain has been from   spinal stenosis and back arthritis . Pinched nerves  The prednisone may help the inflammation of  nerve and   back disc.   Finish the prednisone . we can do a prednisone  taper.    20 mg  per day for total  of 3  days and then  15 mg per day for 2 days then 10 mg  .  X 2 days and off.  The flexeril at 10 mg could cause    to be very loopy  if needed would use only 2.5- 5 mg  dosing .   Bp is up today: check  when not in office.  Let me know how doing at end of prednisone taper.  If ongoin then plan would be to  see a spine.  specialist  or physiatrist.      Standley Brooking. Ariahna Smiddy M.D.

## 2021-09-06 ENCOUNTER — Other Ambulatory Visit: Payer: Self-pay | Admitting: Internal Medicine

## 2021-09-12 ENCOUNTER — Telehealth: Payer: Self-pay | Admitting: Pharmacist

## 2021-09-12 NOTE — Chronic Care Management (AMB) (Signed)
    Chronic Care Management Pharmacy Assistant   Name: Julie Boone  MRN: [id number] DOB: [birth year]  09/15/2021 APPOINTMENT REMINDER   Called Julie Boone, No answer, left message of appointment on 09/15/2021 at 10:30 via telephone visit with Jeni Salles, Pharm D. Notified to have all medications, supplements, blood pressure and/or blood sugar logs available during appointment and to return call if need to reschedule.  Care Gaps: AWV - completed 08/05/2021 Last BP - 160/80 on 09/03/2021  Star Rating Drug: Lisinopril 20 mg - last filled 09/03/2021 90 DS at [facility]  Any gaps in medications fill history?  NO  Gennie Alma [facility]  Catering manager [phone]

## 2021-09-15 ENCOUNTER — Ambulatory Visit (INDEPENDENT_AMBULATORY_CARE_PROVIDER_SITE_OTHER): Payer: Medicare Other | Admitting: Pharmacist

## 2021-09-15 DIAGNOSIS — I82532 Chronic embolism and thrombosis of left popliteal vein: Secondary | ICD-10-CM

## 2021-09-15 DIAGNOSIS — I10 Essential (primary) hypertension: Secondary | ICD-10-CM

## 2021-09-15 NOTE — Patient Instructions (Addendum)
Hi Julie Boone and Julie Boone,  It was great to get to meet you over the telephone! Below is a summary of some of the topics we discussed.   Keep up the good work with checking your blood pressures regularly!  Please reach out to me if you have any questions or need anything!  Best, Maddie  Jeni Salles, PharmD, [facility]   Visit Information   Goals Addressed   None    Patient Care Plan: CCM Pharmacy Care Plan     Problem Identified: Problem: Hypertension, hyperlipidemia, history of PE      Long-Range Goal: Patient-Specific Goal   Start Date: 09/15/2021  Expected End Date: 09/16/2022  This Visit's Progress: On track  Priority: High  Note:   Current Barriers:  Unable to maintain control of blood pressure  Pharmacist Clinical Goal(s):  Patient will maintain control of blood pressure as evidenced by home and office BP readings  through collaboration with PharmD and provider.   Interventions: 1:1 collaboration with Panosh, Standley Brooking, MD regarding development and update of comprehensive plan of care as evidenced by provider attestation and co-signature Inter-disciplinary care team collaboration (see longitudinal plan of care) Comprehensive medication review performed; medication list updated in electronic medical record  Hypertension (BP goal <140/90) -Not ideally controlled -Current treatment: Atenolol 50 mg 1.5 tablets daily - Appropriate, Query effective, Safe, Accessible Lisinopril 20 mg 1 tablet daily - Appropriate, Query effective, Safe, Accessible -Medications previously tried: n/a  -Current home readings: 137/74 (daughter checking it 3 days in a row) - arm cuff (apple product app on her phone records it) -Current dietary habits: limiting sugar intake -Current exercise habits: did not discuss -Denies hypotensive/hypertensive symptoms -Educated on BP goals and benefits of medications for prevention of heart  attack, stroke and kidney damage; Importance of home blood pressure monitoring; Proper BP monitoring technique; -Counseled to monitor BP at home weekly, document, and provide log at future appointments -Counseled on diet and exercise extensively Recommended to continue current medication  Hyperlipidemia: (LDL goal < 100) -Uncontrolled -Current treatment: No medications -Medications previously tried: fish oil  -Current dietary patterns: eating less sweets overall -Current exercise habits: did not discuss -Educated on Cholesterol goals;  Importance of limiting foods high in cholesterol; -Counseled on diet and exercise extensively  History of PE (Goal: prevent blood clots) -Controlled -Current treatment  Eliquis 5 mg 1 tablet twice daily - Appropriate, Effective, Safe, Accessible -Medications previously tried: none  -Counseled on monitoring for signs of bleeding such as unexplained and excessive bleeding from a cut or injury, easy or excessive bruising, blood in urine or stools, and nosebleeds without a known cause.  Health Maintenance -Vaccine gaps: shingrix, Prevnar20, tetanus -Current therapy:  Vitamin D 2000 units 1 tablet daily  Vitamin B12 1000 mcg 1 tablet daily Meclizine 25 mg 1 tablet as needed Ondansetron 4 mg 1 tablet as needed Cyclobenzaprine 10 mg 1/2 to 1 tablet daily -Educated on Cost vs benefit of each product must be carefully weighed by individual consumer -Patient is satisfied with current therapy and denies issues -Recommended to continue current medication  Patient Goals/Self-Care Activities Patient will:  - take medications as prescribed as evidenced by patient report and record review check blood pressure weekly, document, and provide at future appointments  Follow Up Plan: The patient has been provided with contact information for the care management team and has been advised to call with any health related questions or concerns.  Patient  verbalizes understanding of instructions and care plan provided today and agrees to view in [place]. Active MyChart status and patient understanding of how to access instructions and care plan via MyChart confirmed with patient.    The patient has been provided with contact information for the care management team and has been advised to call with any health related questions or concerns.   Viona Gilmore, [facility]

## 2021-09-15 NOTE — Progress Notes (Signed)
Chronic Care Management Pharmacy Note  09/15/2021 Name:  Julie Boone MRN:  [id number] DOB:  [id number]  Summary: BP is at goal < 140/90 per home BP readings  Recommendations/Changes made from today's visit: -Recommended continued BP monitoring at home -Consider repeat lipid panel  Plan: Follow up as needed   Subjective: Julie Boone is an 88 y.o. year old female who is a primary patient of Julie Boone.  The CCM team was consulted for assistance with disease management and care coordination needs.    Engaged with patient by telephone for follow up visit in response to provider referral for pharmacy case management and/or care coordination services.   Consent to Services:  The patient was given information about Chronic Care Management services, agreed to services, and gave verbal consent prior to initiation of services.  Please see initial visit note for detailed documentation.   Patient Care Team: Julie Boone as PCP - General (Internal Medicine) Julie Boone as PCP - Cardiology (Cardiology) Julie Boone as Consulting Physician (General Surgery) Julie Boone as Consulting Physician (Podiatry) Julie Boone, [facility] as Pharmacist (Pharmacist)  Recent office visits: 09/03/21 Julie Boone: Patient presented for hospital follow up. Recommended limiting cyclobenzaprine to 2.5 or 5 mg.  08/05/21 Julie Boone: Patient presented for AWV.  Recent consult visits: 07/21/21 Julie Boone (primary care): Patient presented for dizziness and abnormal EKG.  Hospital visits: 09/03/21 Patient presented to [place] ED for 5 hours for low back and leg pain. Prescribed prednisone and cyclobenzaprine.  08/31/21 Patient presented to [place] ED for 1 hour for leg pain. Prescribed oxycodone and Zofran ODT.  Objective:  Lab Results  Component Value Date   CREATININE 1.19 (H) 09/02/2021   BUN 28 (H)  09/02/2021   GFR 43.72 (L) 07/21/2021   GFRNONAA 42 (L) 09/02/2021   GFRAA 69 04/25/2018   NA 136 09/02/2021   K 4.9 09/02/2021   CALCIUM 9.5 09/02/2021   CO2 21 (L) 09/02/2021   GLUCOSE 99 09/02/2021    Lab Results  Component Value Date/Time   HGBA1C 6.2 05/21/2020 10:55 AM   HGBA1C 6.2 07/10/2019 03:37 PM   GFR 43.72 (L) 07/21/2021 12:18 PM   GFR 56.36 (L) 05/21/2020 10:55 AM    Last diabetic Eye exam: No results found for: "HMDIABEYEEXA"  Last diabetic Foot exam: No results found for: "HMDIABFOOTEX"   Lab Results  Component Value Date   CHOL 187 05/21/2020   HDL 35.30 (L) 05/21/2020   LDLCALC 117 (H) 05/21/2020   LDLDIRECT 103.0 07/10/2019   TRIG 176.0 (H) 05/21/2020   CHOLHDL 5 05/21/2020       Latest Ref Rng & Units 07/21/2021   12:18 PM 05/21/2020   10:55 AM 07/10/2019    3:37 PM  Hepatic Function  Total Protein 6.0 - 8.3 g/dL 6.5  6.3  6.3   Albumin 3.5 - 5.2 g/dL 4.0  4.1  4.1   AST 0 - 37 U/L '20  25  29   ' ALT 0 - 35 U/L '14  21  25   ' Alk Phosphatase 39 - 117 U/L 71  74  84   Total Bilirubin 0.2 - 1.2 mg/dL 0.6  0.7  0.6   Bilirubin, Direct 0.0 - 0.3 mg/dL  0.1  0.1     Lab Results  Component Value Date/Time   TSH 2.59 07/21/2021 12:18 PM   TSH 2.71 05/21/2020 10:55 AM  FREET4 0.96 05/21/2020 10:55 AM   FREET4 0.73 07/10/2019 03:37 PM       Latest Ref Rng & Units 09/02/2021    9:08 AM 07/21/2021   12:18 PM 12/16/2020    6:30 AM  CBC  WBC 4.0 - 10.5 K/uL 12.8  8.3  9.1   Hemoglobin 12.0 - 15.0 g/dL 14.2  14.2  13.4   Hematocrit 36.0 - 46.0 % 43.2  42.5  39.3   Platelets 150 - 400 K/uL 184  258.0  253     No results found for: "VD25OH"  Clinical ASCVD: No  The ASCVD Risk score (Arnett DK, et al., 2019) failed to calculate for the following reasons:   The 2019 ASCVD risk score is only valid for ages 25 to 63       09/03/2021    3:48 PM 08/05/2021    3:26 PM 07/21/2021   11:50 AM  Depression screen PHQ 2/9  Decreased Interest 0 0 0  Down,  Depressed, Hopeless 0 0 0  PHQ - 2 Score 0 0 0  Altered sleeping 1    Tired, decreased energy 0    Change in appetite 0    Feeling bad or failure about yourself  0    Trouble concentrating 0    Moving slowly or fidgety/restless 0    Suicidal thoughts 0    PHQ-9 Score 1    Difficult doing work/chores Not difficult at all        Social History   Tobacco Use  Smoking Status Never  Smokeless Tobacco Never   BP Readings from Last 3 Encounters:  09/03/21 (!) 160/80  09/02/21 (!) 161/68  08/31/21 (!) 151/66   Pulse Readings from Last 3 Encounters:  09/03/21 77  09/02/21 67  08/31/21 (!) 58   Wt Readings from Last 3 Encounters:  09/03/21 163 lb 9.6 oz (74.2 kg)  08/31/21 164 lb (74.4 kg)  08/05/21 162 lb 11.2 oz (73.8 kg)   BMI Readings from Last 3 Encounters:  09/03/21 29.92 kg/m  08/31/21 30.00 kg/m  08/05/21 30.24 kg/m    Assessment/Interventions: Review of patient past medical history, allergies, medications, health status, including review of consultants reports, laboratory and other test data, was performed as part of comprehensive evaluation and provision of chronic care management services.   SDOH:  (Social Determinants of Health) assessments and interventions performed: Yes SDOH Interventions    Flowsheet Row Most Recent Value  SDOH Interventions   Financial Strain Interventions Intervention Not Indicated      SDOH Screenings   Alcohol Screen: Not on file  Depression (PHQ2-9): Low Risk  (09/03/2021)   Depression (PHQ2-9)    PHQ-2 Score: 1  Financial Resource Strain: Low Risk  (09/15/2021)   Overall Financial Resource Strain (CARDIA)    Difficulty of Paying Living Expenses: Not hard at all  Food Insecurity: No Food Insecurity (08/05/2021)   Hunger Vital Sign    Worried About Running Out of Food in the Last Year: Never true    Ran Out of Food in the Last Year: Never true  Housing: Low Risk  (07/30/2020)   Housing    Last Housing Risk Score: 0  Physical  Activity: Inactive (08/05/2021)   Exercise Vital Sign    Days of Exercise per Week: 0 days    Minutes of Exercise per Session: 0 min  Social Connections: Moderately Isolated (07/30/2020)   Social Connection and Isolation Panel [NHANES]    Frequency of Communication with Friends and Family:  More than three times a week    Frequency of Social Gatherings with Friends and Family: Twice a week    Attends Religious Services: 1 to 4 times per year    Active Member of [employer] or Organizations: No    Attends Archivist Meetings: Never    Marital Status: Widowed  Stress: No Stress Concern Present (08/05/2021)   [place]    Feeling of Stress : Not at all  Tobacco Use: Low Risk  (09/03/2021)   Patient History    Smoking Tobacco Use: Never    Smokeless Tobacco Use: Never    Passive Exposure: Not on file  Transportation Needs: No Transportation Needs (08/05/2021)   PRAPARE - Transportation    Lack of Transportation (Medical): No    Lack of Transportation (Non-Medical): No    CCM Care Plan  Allergies  Allergen Reactions   Shellfish Allergy Anaphylaxis and Swelling   Morphine And Related Nausea And Vomiting    Medications Reviewed Today     Reviewed by Julie Boone, [facility] (Pharmacist) on 09/15/21 at 77  Med List Status: <None>   Medication Order Taking? Sig Documenting Provider Last Dose Status Informant  apixaban (ELIQUIS) 5 MG TABS tablet [id number] Yes Take 1 tablet (5 mg total) by mouth 2 (two) times daily. Julie Boone Taking Active   atenolol (TENORMIN) 50 MG tablet [id number] Yes TAKE 1 AND 1/2 TABLET BY MOUTH DAILY Julie Boone Taking Active   Cholecalciferol (VITAMIN D-3 PO) [id number] Yes Take 2,000 Units by mouth daily. Provider, Historical, Boone Taking Active Self  Cyanocobalamin (VITAMIN B12 SL) [id number] Yes Place 1,000 mcg under the tongue daily. Provider, Historical, Boone Taking Active    cyclobenzaprine (FLEXERIL) 10 MG tablet [id number] Yes Take 1 tablet (10 mg total) by mouth 2 (two) times daily as needed for muscle spasms. Pattricia Boss, Boone Taking Active   lisinopril (ZESTRIL) 20 MG tablet [id number] Yes TAKE ONE TABLET BY MOUTH DAILY Julie Boone Taking Active   meclizine (ANTIVERT) 25 MG tablet [id number]  1/2 to 1  po up to every 8 hours as needed for vertigo  Fall precautions Julie Boone  Active   ondansetron (ZOFRAN-ODT) 4 MG disintegrating tablet [id number] No Take 1 tablet (4 mg total) by mouth every 8 (eight) hours as needed for nausea or vomiting.  Patient not taking: Reported on 09/15/2021   Luna Fuse, Boone Not Taking Active   UNABLE TO FIND [id number] Yes Med Name: [facility] leg cramps ho meo pathix med  Takes as needed for leg cramps says it works well Provider, Historical, Boone Taking Active Family Member            Patient Active Problem List   Diagnosis Date Noted   Gastroesophageal reflux disease 01/12/2019   Lower abdominal pain 01/12/2019   Other constipation 01/12/2019   Acute pulmonary embolism without acute cor pulmonale ([place]) 04/05/2018   Abnormal chest x-ray 09/22/2013   Allergic rhinitis 06/23/2013   Wax in ear 06/23/2013   Abnormal thyroid blood test 04/14/2013   Elevated blood sugar 04/14/2013   Decreased hearing 04/14/2013   Wears hearing aid 03/03/2013   Essential hypertension 03/03/2013   Leg cramps 03/03/2013   Influenza vaccination declined by patient 03/03/2013   Pneumococcal vaccination declined by patient 03/03/2013   Breast cancer ([place])     Immunization History  Administered Date(s) Administered   PFIZER(Purple Top)SARS-COV-2 Vaccination 03/22/2019, 04/10/2019, 02/21/2020  Patient lives with her daughter Julie Boone) who helps her manage her medications. She feels very blessed to have her daughter and is very pleased by the care she is receiving from her and her healthcare team.  Conditions to be  addressed/monitored:  Hypertension, hyperlipidemia, GERD, history of PE  Conditions addressed this visit: Hypertension, hyperlipidemia, history of PE  Care Plan : [place]  Updates made by Julie Boone, [place] since 09/15/2021 12:00 AM     Problem: Problem: Hypertension, hyperlipidemia, history of PE      Long-Range Goal: Patient-Specific Goal   Start Date: 09/15/2021  Expected End Date: 09/16/2022  This Visit's Progress: On track  Priority: High  Note:   Current Barriers:  Unable to maintain control of blood pressure  Pharmacist Clinical Goal(s):  Patient will maintain control of blood pressure as evidenced by home and office BP readings  through collaboration with PharmD and provider.   Interventions: 1:1 collaboration with Julie Boone regarding development and update of comprehensive plan of care as evidenced by provider attestation and co-signature Inter-disciplinary care team collaboration (see longitudinal plan of care) Comprehensive medication review performed; medication list updated in electronic medical record  Hypertension (BP goal <140/90) -Not ideally controlled -Current treatment: Atenolol 50 mg 1.5 tablets daily - Appropriate, Query effective, Safe, Accessible Lisinopril 20 mg 1 tablet daily - Appropriate, Query effective, Safe, Accessible -Medications previously tried: n/a  -Current home readings: 137/74 (daughter checking it 3 days in a row) - arm cuff (apple product app on her phone records it) -Current dietary habits: limiting sugar intake -Current exercise habits: did not discuss -Denies hypotensive/hypertensive symptoms -Educated on BP goals and benefits of medications for prevention of heart attack, stroke and kidney damage; Importance of home blood pressure monitoring; Proper BP monitoring technique; -Counseled to monitor BP at home weekly, document, and provide log at future appointments -Counseled on diet and exercise  extensively Recommended to continue current medication  Hyperlipidemia: (LDL goal < 100) -Uncontrolled -Current treatment: No medications -Medications previously tried: fish oil  -Current dietary patterns: eating less sweets overall -Current exercise habits: did not discuss -Educated on Cholesterol goals;  Importance of limiting foods high in cholesterol; -Counseled on diet and exercise extensively  History of PE (Goal: prevent blood clots) -Controlled -Current treatment  Eliquis 5 mg 1 tablet twice daily - Appropriate, Effective, Safe, Accessible -Medications previously tried: none  -Counseled on monitoring for signs of bleeding such as unexplained and excessive bleeding from a cut or injury, easy or excessive bruising, blood in urine or stools, and nosebleeds without a known cause.  Health Maintenance -Vaccine gaps: shingrix, Prevnar20, tetanus -Current therapy:  Vitamin D 2000 units 1 tablet daily  Vitamin B12 1000 mcg 1 tablet daily Meclizine 25 mg 1 tablet as needed Ondansetron 4 mg 1 tablet as needed Cyclobenzaprine 10 mg 1/2 to 1 tablet daily -Educated on Cost vs benefit of each product must be carefully weighed by individual consumer -Patient is satisfied with current therapy and denies issues -Recommended to continue current medication  Patient Goals/Self-Care Activities Patient will:  - take medications as prescribed as evidenced by patient report and record review check blood pressure weekly, document, and provide at future appointments  Follow Up Plan: The patient has been provided with contact information for the care management team and has been advised to call with any health related questions or concerns.        Medication Assistance: None required.  Patient affirms current coverage meets needs.  Compliance/Adherence/Medication fill history: Care Gaps: Shingrix, COVID booster, Prevnar20, mammogram, tetanus Last BP - 160/80 on 09/03/2021  Star-Rating  Drugs: Lisinopril 20 mg - last filled 09/03/2021 90 DS at [facility]  Patient's preferred pharmacy is:  [id number] - [place], [place] [facility] AT [facility] [address] [place] [address] Phone: [phone] Fax: [place] [id number] [place], [place] [address] Phone: [phone] Fax: [phone]  [id number] [place] [id number]: [phone]: [phone] Uses pill box? Yes Pt endorses 99% compliance  We discussed: Current pharmacy is preferred with insurance plan and patient is satisfied with pharmacy services Patient decided to: Continue current medication management strategy  Care Plan and Follow Up Patient Decision:  Patient agrees to Care Plan and Follow-up.  Plan: The patient has been provided with contact information for the care management team and has been advised to call with any health related questions or concerns.   MJeni Salles PharmD, BMurrayPharmacist [place]

## 2021-09-19 ENCOUNTER — Encounter: Payer: Self-pay | Admitting: Internal Medicine

## 2021-09-19 MED ORDER — APIXABAN 5 MG PO TABS: 5.0000 mg | ORAL_TABLET | Freq: Two times a day (BID) | ORAL | 0 refills | Status: DC

## 2021-09-25 NOTE — Telephone Encounter (Signed)
Glad she is feeling better   I dont  feel confortable to have an open rx for prednisone although we can certainly refill as inidcated  . Before taking prednisone we always want to be sure that infection not causing pain cause steroids can decrease immunity to fight infection.   So contact us if  pain flare returns .   (It appears that dr Margaretann Loveless refilled the eliquis)

## 2021-09-29 DIAGNOSIS — E785 Hyperlipidemia, unspecified: Secondary | ICD-10-CM

## 2021-09-29 DIAGNOSIS — I10 Essential (primary) hypertension: Secondary | ICD-10-CM

## 2021-09-29 DIAGNOSIS — Z86711 Personal history of pulmonary embolism: Secondary | ICD-10-CM

## 2021-10-05 ENCOUNTER — Other Ambulatory Visit: Payer: Self-pay | Admitting: Internal Medicine

## 2021-11-16 ENCOUNTER — Other Ambulatory Visit: Payer: Self-pay | Admitting: Internal Medicine

## 2021-11-26 ENCOUNTER — Encounter: Payer: Self-pay | Admitting: Internal Medicine

## 2021-11-26 ENCOUNTER — Ambulatory Visit: Payer: Medicare Other | Attending: Internal Medicine | Admitting: Internal Medicine

## 2021-11-26 VITALS — BP 142/78 | HR 76 | Ht 64.0 in | Wt 161.4 lb

## 2021-11-26 DIAGNOSIS — I480 Paroxysmal atrial fibrillation: Secondary | ICD-10-CM | POA: Diagnosis not present

## 2021-11-26 DIAGNOSIS — Z86711 Personal history of pulmonary embolism: Secondary | ICD-10-CM

## 2021-11-26 DIAGNOSIS — I10 Essential (primary) hypertension: Secondary | ICD-10-CM | POA: Diagnosis not present

## 2021-11-26 DIAGNOSIS — Z79899 Other long term (current) drug therapy: Secondary | ICD-10-CM

## 2021-11-26 MED ORDER — APIXABAN 5 MG PO TABS: 5.0000 mg | ORAL_TABLET | Freq: Two times a day (BID) | ORAL | 3 refills | Status: DC

## 2021-11-26 MED ORDER — LISINOPRIL 20 MG PO TABS: 20.0000 mg | ORAL_TABLET | Freq: Every day | ORAL | 3 refills | Status: DC

## 2021-11-26 NOTE — Patient Instructions (Signed)
Medication Instructions:  No Changes In Medications at this time.  *If you need a refill on your cardiac medications before your next appointment, please call your pharmacy*  Follow-Up: At [facility], you and your health needs are our priority.  As part of our continuing mission to provide you with exceptional heart care, we have created designated Provider Care Teams.  These Care Teams include your primary Cardiologist (physician) and Advanced Practice Providers (APPs -  Physician Assistants and Nurse Practitioners) who all work together to provide you with the care you need, when you need it.  Your next appointment:   1 year(s)  The format for your next appointment:   In Person  Provider:   Gayatri A Acharya, MD          

## 2021-11-26 NOTE — Progress Notes (Addendum)
Cardiology Office Note:    Date:  11/26/2021   ID:  Julie Boone, DOB [birth year], MRN [id number]  PCP:  Burnis Medin, MD  Cardiologist:  Elouise Munroe, MD  Electrophysiologist:  None   Referring MD: Burnis Medin, MD   Chief Complaint/Reason for Referral: PAF, HTN, PE  History of Present Illness:    Julie Boone is a 88 y.o. female with a history of PAF, HTN, and PE who presents for follow up.  She previously noted right leg cramping for which she uses an herbal product (appears to contain magnesium). She has used this for years and we discussed use sparingly.   Follows with me for unprovoked PE and continued anticoagulation. She also has PAF with CHADS2VASC of 4 with indications for continued anticoagulation. No bleeding complications.   She remains in excellent spirits as always with no significant symptoms. She denies chest pain, chest pressure, dyspnea at rest or with exertion, palpitations, PND, orthopnea, or leg swelling. Denies cough, fever, chills. Denies nausea, vomiting. Denies syncope or presyncope. Denies dizziness or lightheadedness.  Past Medical History:  Diagnosis Date   Acute sinusitis with symptoms greater than 10 days 06/23/2013   Arthritis    Breast cancer ([place])    mastectomy 3   neg ln    HOH (hard of hearing)    HTN (hypertension)    Wears glasses    Wears hearing aid    both ears    Past Surgical History:  Procedure Laterality Date   APPENDECTOMY  2006   BREAST BIOPSY Left 2012   COLONOSCOPY     HERNIA REPAIR     INSERTION OF MESH N/A 09/05/2013   Procedure: INSERTION OF MESH;  Surgeon: Joyice Faster. Cornett, MD;  Location: [facility];  Service: General;  Laterality: N/A;   MASTECTOMY Left 2013   TONSILLECTOMY AND ADENOIDECTOMY     Childhood   UMBILICAL HERNIA REPAIR N/A 09/05/2013   Procedure: HERNIA REPAIR UMBILICAL ADULT WITH MESH;  Surgeon: Joyice Faster. Cornett, MD;  Location: [facility];  Service:  General;  Laterality: N/A;    Current Medications:  Outpatient Encounter Medications as of 11/26/2021  Medication Sig   atenolol (TENORMIN) 50 MG tablet TAKE 1 AND 1/2 TABLET BY MOUTH DAILY   Cholecalciferol (VITAMIN D-3 PO) Take 2,000 Units by mouth daily.   Cyanocobalamin (VITAMIN B12 SL) Place 1,000 mcg under the tongue daily.   cyclobenzaprine (FLEXERIL) 10 MG tablet Take 1 tablet (10 mg total) by mouth 2 (two) times daily as needed for muscle spasms.   meclizine (ANTIVERT) 25 MG tablet 1/2 to 1  po up to every 8 hours as needed for vertigo  Fall precautions   ondansetron (ZOFRAN-ODT) 4 MG disintegrating tablet Take 1 tablet (4 mg total) by mouth every 8 (eight) hours as needed for nausea or vomiting.   UNABLE TO FIND Med Name: [place] leg cramps ho meo pathix med  Takes as needed for leg cramps says it works well   [DISCONTINUED] apixaban (ELIQUIS) 5 MG TABS tablet Take 1 tablet (5 mg total) by mouth 2 (two) times daily.   [DISCONTINUED] lisinopril (ZESTRIL) 20 MG tablet TAKE ONE TABLET BY MOUTH DAILY   apixaban (ELIQUIS) 5 MG TABS tablet Take 1 tablet (5 mg total) by mouth 2 (two) times daily.   lisinopril (ZESTRIL) 20 MG tablet Take 1 tablet (20 mg total) by mouth daily.   No facility-administered encounter medications on file as of 11/26/2021.  Allergies:   Shellfish allergy and Morphine and related   Social History   Tobacco Use   Smoking status: Never   Smokeless tobacco: Never  Vaping Use   Vaping Use: Never used  Substance Use Topics   Alcohol use: Yes    Alcohol/week: 0.0 standard drinks of alcohol    Comment: 1 time a month. has a sip ever now and then    Drug use: No     Family History: The patient's family history includes Colon cancer in her father; Heart failure in her mother; Hypertension in her mother; Lung disease in her sister; Stroke in her mother. There is no history of Esophageal cancer, Inflammatory bowel disease, Liver disease, Pancreatic cancer, or  Stomach cancer.  ROS:   Please see the history of present illness.    All other systems reviewed and are negative.  EKGs/Labs/Other Studies Reviewed:    The following studies were reviewed today:  EKG:  NSR, LAFB  Recent Labs: 07/21/2021: ALT 14; TSH 2.59 09/02/2021: BUN 28; Creatinine, Ser 1.19; Hemoglobin 14.2; Platelets 184; Potassium 4.9; Sodium 136  Recent Lipid Panel    Component Value Date/Time   CHOL 187 05/21/2020 1055   TRIG 176.0 (H) 05/21/2020 1055   HDL 35.30 (L) 05/21/2020 1055   CHOLHDL 5 05/21/2020 1055   VLDL 35.2 05/21/2020 1055   LDLCALC 117 (H) 05/21/2020 1055   LDLDIRECT 103.0 07/10/2019 1537    Physical Exam:    VS:  BP (!) 142/78   Pulse 76   Ht '5\' 4"'$  (1.626 m)   Wt 161 lb 6.4 oz (73.2 kg)   SpO2 96%   BMI 27.70 kg/m     Wt Readings from Last 5 Encounters:  11/26/21 161 lb 6.4 oz (73.2 kg)  09/03/21 163 lb 9.6 oz (74.2 kg)  08/31/21 164 lb (74.4 kg)  08/05/21 162 lb 11.2 oz (73.8 kg)  07/21/21 166 lb (75.3 kg)    Constitutional: No acute distress Eyes: sclera non-icteric, normal conjunctiva and lids ENMT: normal dentition, moist mucous membranes Cardiovascular: regular rhythm, normal rate, no murmurs. S1 and S2 normal.  No jugular venous distention.  Respiratory: clear to auscultation bilaterally GI : normal bowel sounds, soft and nontender. No distention.   MSK: extremities warm, well perfused. No edema.  NEURO: grossly nonfocal exam, moves all extremities. PSYCH: alert and oriented x 3, normal mood and affect.   ASSESSMENT:    1. Paroxysmal atrial fibrillation (HCC)   2. Essential hypertension   3. History of pulmonary embolism   4. Medication management     PLAN:    Paroxysmal atrial fibrillation ([place]) - Plan: EKG 12-Lead - no palpitations, no recurrence by ECG. CHADS2VASC 4, continue Eliquis 5 mg BID (meets weight and renal function criteria to continue full strength AC).  - patient takes atenolol for rate control and HTN. We  have discussed in the past the decreased renal clearance of atenolol with age, and would prefer to switch her to a different agent. She feels things are stable and would like defer this.   History of pulmonary embolism - seems unprovoked. She has an additional indication for indefinite anticoagulation for PAF, can continue AC at this time. No bleeding.  Refills provided today.  Essential hypertension - BP elevated today but she feels this is situational, and generally is within normal range at home. Continue lisinopril and atenolol, will review at follow up. Refills provided today.    Cherlynn Kaiser, MD, [place]  Medication Adjustments/Labs and Tests Ordered: Current medicines are reviewed at length with the patient today.  Concerns regarding medicines are outlined above.   Orders Placed This Encounter  Procedures   EKG 12-Lead    Meds ordered this encounter  Medications   apixaban (ELIQUIS) 5 MG TABS tablet    Sig: Take 1 tablet (5 mg total) by mouth 2 (two) times daily.    Dispense:  60 tablet    Refill:  3   lisinopril (ZESTRIL) 20 MG tablet    Sig: Take 1 tablet (20 mg total) by mouth daily.    Dispense:  90 tablet    Refill:  3    Patient Instructions  Medication Instructions:  No Changes In Medications at this time.  *If you need a refill on your cardiac medications before your next appointment, please call your pharmacy*  Follow-Up: At [facility], you and your health needs are our priority.  As part of our continuing mission to provide you with exceptional heart care, we have created designated Provider Care Teams.  These Care Teams include your primary Cardiologist (physician) and Advanced Practice Providers (APPs -  Physician Assistants and Nurse Practitioners) who all work together to provide you with the care you need, when you need it.  Your next appointment:   1 year(s)  The format for your next appointment:   In  Person  Provider:   Elouise Munroe, MD

## 2021-12-24 NOTE — Addendum Note (Signed)
Addended by: Cherlynn Kaiser A on: 12/24/2021 08:16 AM   Modules accepted: Level of Service

## 2022-01-02 ENCOUNTER — Other Ambulatory Visit: Payer: Self-pay | Admitting: Internal Medicine

## 2022-03-05 ENCOUNTER — Telehealth: Payer: Self-pay | Admitting: Internal Medicine

## 2022-03-05 ENCOUNTER — Other Ambulatory Visit: Payer: Self-pay | Admitting: Internal Medicine

## 2022-03-05 ENCOUNTER — Encounter: Payer: Self-pay | Admitting: Internal Medicine

## 2022-03-05 NOTE — Telephone Encounter (Addendum)
Daughter stated that on 03/02/22, patient has an episode of being clammy, weak, and P 180s. Patient did not go to the ED. As of 1/3, patient has been feeling good. Daughter stated patient has been taking eliquis '5mg'$  once a day since October. She went back to '5mg'$  twice daily on 03/03/22. Daughter sated patient was in donut hole and wanted to conserve medication. Informed daughter that our clinic has samples to provide for that reason; to call our clinic next time. Explained to daughter importance for patient taking medications as prescribed. There is a question on the doses of lisinopril and atenolol the patient has been taking. There is not a current BP/P. Daughter will get readings when home and provide them to [place]. Recommended that if patient has any sweating, nausea, dizziness, SOB, to call 911. Daughter verbalized understanding. Daughter called back with VS: BP 143/64, P 73. Daughter stated patient is taking atenolol '50mg'$ . Recommended that patient take '75mg'$  as prescribed. Daughter verbalized understanding.[name]

## 2022-03-05 NOTE — Telephone Encounter (Signed)
Pt returning nurses call as requested with HR and BP results. Please advise

## 2022-03-05 NOTE — Telephone Encounter (Signed)
See prior note

## 2022-03-06 MED ORDER — MECLIZINE HCL 25 MG PO TABS: ORAL_TABLET | ORAL | 0 refills | Status: DC

## 2022-04-08 ENCOUNTER — Other Ambulatory Visit: Payer: Self-pay | Admitting: Internal Medicine

## 2022-05-18 ENCOUNTER — Telehealth: Payer: Self-pay | Admitting: Internal Medicine

## 2022-05-18 NOTE — Telephone Encounter (Signed)
Daughter in law called to say Pt starts to bleed from her hemorrhoids every time she has a bowel movement.  Call was transferred to the Triage Nurse for advice.    TN advised that Pt be taken to the ED, immediately.   DIL states she does not think this necessary, as it happens all the time and they would rather just make an appt with MD.   DIL admits it is more that just a few drops, and the toilet bowl does appear to get full of blood.  Pt starts to feel weak after, but then starts to feel better after some time has passed.  DIL informed that MD is OOO today.   DIL is asking if she should follow the advice of the TN, or just make an appointment?  Please advise (ASAP)

## 2022-05-18 NOTE — Telephone Encounter (Signed)
Fyi   Spoke to daughter. Pamala Hurry. Advise her to head to ED as looking at pt's age and the amount of blood that was describe.   Daughter states it doesn't happen often but will take the advise.   Inform daughter to keep [place] updated. Verbalized understanding.

## 2022-05-23 ENCOUNTER — Other Ambulatory Visit: Payer: Self-pay | Admitting: Internal Medicine

## 2022-06-02 ENCOUNTER — Telehealth (INDEPENDENT_AMBULATORY_CARE_PROVIDER_SITE_OTHER): Payer: Medicare Other | Admitting: Internal Medicine

## 2022-06-02 ENCOUNTER — Encounter: Payer: Self-pay | Admitting: Internal Medicine

## 2022-06-02 ENCOUNTER — Telehealth: Payer: Self-pay | Admitting: Gastroenterology

## 2022-06-02 ENCOUNTER — Other Ambulatory Visit (INDEPENDENT_AMBULATORY_CARE_PROVIDER_SITE_OTHER): Payer: Medicare Other

## 2022-06-02 VITALS — BP 160/81 | HR 75 | Ht 64.0 in | Wt 161.0 lb

## 2022-06-02 DIAGNOSIS — Z79899 Other long term (current) drug therapy: Secondary | ICD-10-CM | POA: Diagnosis not present

## 2022-06-02 DIAGNOSIS — R739 Hyperglycemia, unspecified: Secondary | ICD-10-CM

## 2022-06-02 DIAGNOSIS — I480 Paroxysmal atrial fibrillation: Secondary | ICD-10-CM | POA: Diagnosis not present

## 2022-06-02 DIAGNOSIS — K649 Unspecified hemorrhoids: Secondary | ICD-10-CM

## 2022-06-02 DIAGNOSIS — Z7901 Long term (current) use of anticoagulants: Secondary | ICD-10-CM

## 2022-06-02 DIAGNOSIS — Z86711 Personal history of pulmonary embolism: Secondary | ICD-10-CM

## 2022-06-02 LAB — BASIC METABOLIC PANEL
BUN: 28 mg/dL — ABNORMAL HIGH (ref 6–23)
CO2: 25 mEq/L (ref 19–32)
Calcium: 9.5 mg/dL (ref 8.4–10.5)
Chloride: 104 mEq/L (ref 96–112)
Creatinine, Ser: 1.02 mg/dL (ref 0.40–1.20)
GFR: 46.54 mL/min — ABNORMAL LOW (ref >60.00)
Glucose, Bld: 131 mg/dL — ABNORMAL HIGH (ref 70–99)
Potassium: 4.1 mEq/L (ref 3.5–5.1)
Sodium: 138 mEq/L (ref 135–145)

## 2022-06-02 LAB — CBC WITH DIFFERENTIAL/PLATELET
Basophils Absolute: 0.1 10*3/uL (ref 0.0–0.1)
Basophils Relative: 0.8 % (ref 0.0–3.0)
Eosinophils Absolute: 0.4 10*3/uL (ref 0.0–0.7)
Eosinophils Relative: 3.2 % (ref 0.0–5.0)
HCT: 43 % (ref 36.0–46.0)
Hemoglobin: 14.4 g/dL (ref 12.0–15.0)
Lymphocytes Relative: 23.2 % (ref 12.0–46.0)
Lymphs Abs: 2.8 10*3/uL (ref 0.7–4.0)
MCHC: 33.4 g/dL (ref 30.0–36.0)
MCV: 97.3 fl (ref 78.0–100.0)
Monocytes Absolute: 1.2 10*3/uL — ABNORMAL HIGH (ref 0.1–1.0)
Monocytes Relative: 10.2 % (ref 3.0–12.0)
Neutro Abs: 7.7 10*3/uL (ref 1.4–7.7)
Neutrophils Relative %: 62.6 % (ref 43.0–77.0)
Platelets: 344 10*3/uL (ref 150.0–400.0)
RBC: 4.42 Mil/uL (ref 3.87–5.11)
RDW: 12.8 % (ref 11.5–15.5)
WBC: 12.2 10*3/uL — ABNORMAL HIGH (ref 4.0–10.5)

## 2022-06-02 LAB — HEMOGLOBIN A1C: Hgb A1c MFr Bld: 6.2 % (ref 4.6–6.5)

## 2022-06-02 NOTE — Progress Notes (Signed)
Good news .. hemoglobin is stable and no anemia  Wbc slight elevation  uncertain significance  Kidney function stable  Blood sugar in prediabetic range but stable

## 2022-06-02 NOTE — Telephone Encounter (Signed)
Patty,  Urgent referral in [place].  Patient is on eliquis for hx of pe and paf with recurrent hemorrhoidal bleeding. "Please see asap stable today."  Last saw Dr. Charlynne Cousins in 2020. Please advise scheduling  Thanks Alyse Low

## 2022-06-02 NOTE — Telephone Encounter (Signed)
Patients daughter called to schedule office visit for recurrent hemorrhoidal bleeding, I see you have a note to schedule the patient, would it be okay to schedule out in May? Please advise.

## 2022-06-02 NOTE — Progress Notes (Signed)
Virtual Visit via Video Note  I connected with [name] on [id number] at  8:30 AM EDT by a video enabled telemedicine application and verified that I am speaking with the correct person using two identifiers. Location patient: home Location provider:work office Persons participating in the virtual visit: patient, provider and daughter Julie Boone  Patient aware  of the limitations of evaluation and management by telemedicine and  availability of in person appointments. and agreed to proceed.   HPI: Julie Boone presents for video visit   Past months  she has noted  increase sx with bleeding hemmorrhoids:  BRB blood heavier and more often and no pain ( like constipated  in past )   a lot   wears a pad sometime .   3 x per week     NO other bleeding   Taking powder  and not constipated .  No more  Sitz baths  also  and no fainting other .  But feels weak as per daughter after  episodes  No other bleeding . Has used "every otc s" not helpful Has picture of toilet bowel with brb and  hem.   NO abd pain new sx   ROS: See pertinent positives and negatives per HPI.  Past Medical History:  Diagnosis Date   Acute sinusitis with symptoms greater than 10 days 06/23/2013   Arthritis    Breast cancer    mastectomy 3   neg ln    HOH (hard of hearing)    HTN (hypertension)    Wears glasses    Wears hearing aid    both ears    Past Surgical History:  Procedure Laterality Date   APPENDECTOMY  2006   BREAST BIOPSY Left 2012   COLONOSCOPY     HERNIA REPAIR     INSERTION OF MESH N/A 09/05/2013   Procedure: INSERTION OF MESH;  Surgeon: Joyice Faster. Cornett, MD;  Location: [facility];  Service: General;  Laterality: N/A;   MASTECTOMY Left 2013   TONSILLECTOMY AND ADENOIDECTOMY     Childhood   UMBILICAL HERNIA REPAIR N/A 09/05/2013   Procedure: HERNIA REPAIR UMBILICAL ADULT WITH MESH;  Surgeon: Joyice Faster. Cornett, MD;  Location: [facility];  Service:  General;  Laterality: N/A;    Family History  Problem Relation Age of Onset   Hypertension Mother    Stroke Mother    Heart failure Mother    Lung disease Sister        smoker   Colon cancer Father        dx in his late 30's   Esophageal cancer Neg Hx    Inflammatory bowel disease Neg Hx    Liver disease Neg Hx    Pancreatic cancer Neg Hx    Stomach cancer Neg Hx     Social History   Tobacco Use   Smoking status: Never   Smokeless tobacco: Never  Vaping Use   Vaping Use: Never used  Substance Use Topics   Alcohol use: Yes    Alcohol/week: 0.0 standard drinks of alcohol    Comment: 1 time a month. has a sip ever now and then    Drug use: No      Current Outpatient Medications:    apixaban (ELIQUIS) 5 MG TABS tablet, Take 1 tablet (5 mg total) by mouth 2 (two) times daily., Disp: 60 tablet, Rfl: 3   atenolol (TENORMIN) 50 MG tablet, TAKE 1 AND 1/2 TABLET BY MOUTH  DAILY, Disp: 135 tablet, Rfl: 0   Cholecalciferol (VITAMIN D-3 PO), Take 2,000 Units by mouth daily., Disp: , Rfl:    Cyanocobalamin (VITAMIN B12 SL), Place 1,000 mcg under the tongue daily., Disp: , Rfl:    cyclobenzaprine (FLEXERIL) 10 MG tablet, Take 1 tablet (10 mg total) by mouth 2 (two) times daily as needed for muscle spasms., Disp: 20 tablet, Rfl: 0   lisinopril (ZESTRIL) 20 MG tablet, Take 1 tablet (20 mg total) by mouth daily., Disp: 90 tablet, Rfl: 3   meclizine (ANTIVERT) 25 MG tablet, 1/2 to 1  po up to every 8 hours as needed for vertigo  Fall precautions, Disp: 20 tablet, Rfl: 0   ondansetron (ZOFRAN-ODT) 4 MG disintegrating tablet, Take 1 tablet (4 mg total) by mouth every 8 (eight) hours as needed for nausea or vomiting., Disp: 20 tablet, Rfl: 0   UNABLE TO FIND, Med Name: [place] leg cramps ho meo pathix med  Takes as needed for leg cramps says it works well, Disp: , Rfl:   EXAM: BP Readings from Last 3 Encounters:  06/02/22 (!) 160/81  11/26/21 (!) 142/78  09/03/21 (!) 160/80    VITALS  per patient if applicable:  GENERAL: alert, oriented, appears well and in no acute distress  HEENT: atraumatic, conjunttiva clear, no obvious abnormalities on inspection of external nose and ears  NECK: normal movements of the head and neck  LUNGS: on inspection no signs of respiratory distress, breathing rate appears normal, no obvious gross SOB, gasping or wheezing  CV: no obvious cyanosis  MS: moves all visible extremities without noticeable abnormality  PSYCH/NEURO: pleasant and cooperative, no obvious depression or anxiety, speech and thought processing grossly intact Lab Results  Component Value Date   WBC 12.8 (H) 09/02/2021   HGB 14.2 09/02/2021   HCT 43.2 09/02/2021   PLT 184 09/02/2021   GLUCOSE 99 09/02/2021   CHOL 187 05/21/2020   TRIG 176.0 (H) 05/21/2020   HDL 35.30 (L) 05/21/2020   LDLDIRECT 103.0 07/10/2019   LDLCALC 117 (H) 05/21/2020   ALT 14 07/21/2021   AST 20 07/21/2021   NA 136 09/02/2021   K 4.9 09/02/2021   CL 102 09/02/2021   CREATININE 1.19 (H) 09/02/2021   BUN 28 (H) 09/02/2021   CO2 21 (L) 09/02/2021   TSH 2.59 07/21/2021   INR 0.93 04/01/2018   HGBA1C 6.2 05/21/2020    ASSESSMENT AND PLAN:  Discussed the following assessment and plan:    ICD-10-CM   1. Bleeding hemorrhoids  [id number] Basic metabolic panel    CBC with Differential/Platelet    Hemoglobin A1c    Ambulatory referral to Gastroenterology    2. Anticoagulant long-term use  123456 Basic metabolic panel    CBC with Differential/Platelet    Hemoglobin A1c    Ambulatory referral to Gastroenterology    3. PAF (paroxysmal atrial fibrillation)  [id number] Basic metabolic panel    CBC with Differential/Platelet    Hemoglobin A1c   followed cardiology stable    4. Medication management  123456 Basic metabolic panel    CBC with Differential/Platelet    Hemoglobin A1c    5. Elevated blood sugar  123456 Basic metabolic panel    CBC with Differential/Platelet    Hemoglobin A1c   in  past    6. History of pulmonary embolism  Z86.711    primary    Revewied pixs of blood in toilet and erianal area  grape like hemorrhoids dark pink ? If prolapsing no  blood seen  Stay on stool softening   Plan lab updated cbc bmp A1c at [facility] Gi opinion for ? Procedure   if bleeding  not  stopping etc will need to stop the anticoag but not actively bleeding now and seems stable . No other evidence of systemic blood loss.  Counseled.   Expectant management and discussion of plan and treatment with opportunity to ask questions and all were answered. The patient agreed with the plan and demonstrated an understanding of the instructions.   Advised to call back or seek an in-person evaluation if worsening  or having  further concerns  in interim. Return for emer care if on going bleeding lab and gi consult .   [name], MD

## 2022-06-02 NOTE — Telephone Encounter (Signed)
Yes if that's all we have and put her on the wait list. Thank you

## 2022-06-02 NOTE — Telephone Encounter (Signed)
LM on vmail to call back to schedule with app

## 2022-06-02 NOTE — Telephone Encounter (Signed)
Can you schedule with the first available appt with app?

## 2022-06-08 ENCOUNTER — Other Ambulatory Visit: Payer: Self-pay

## 2022-06-08 DIAGNOSIS — I480 Paroxysmal atrial fibrillation: Secondary | ICD-10-CM

## 2022-06-08 MED ORDER — APIXABAN 5 MG PO TABS: 5.0000 mg | ORAL_TABLET | Freq: Two times a day (BID) | ORAL | 5 refills | Status: DC

## 2022-06-08 NOTE — Telephone Encounter (Signed)
Prescription refill request for Eliquis received. Indication: Afib  Last office visit: 11/26/21 Jacques [facility])  Scr: 1.02 (06/02/22)  Age: 89 Weight: 73kg  Appropriate dose. Refill sent.

## 2022-07-07 ENCOUNTER — Other Ambulatory Visit: Payer: Self-pay

## 2022-07-07 MED ORDER — ATENOLOL 50 MG PO TABS: 75.0000 mg | ORAL_TABLET | Freq: Every day | ORAL | 0 refills | Status: DC

## 2022-07-21 ENCOUNTER — Ambulatory Visit: Payer: Medicare Other | Admitting: Physician Assistant

## 2022-08-03 ENCOUNTER — Ambulatory Visit (INDEPENDENT_AMBULATORY_CARE_PROVIDER_SITE_OTHER): Payer: Medicare Other

## 2022-08-03 VITALS — Ht 64.0 in | Wt 161.0 lb

## 2022-08-03 DIAGNOSIS — Z0000 Encounter for general adult medical examination without abnormal findings: Secondary | ICD-10-CM

## 2022-08-03 NOTE — Progress Notes (Signed)
Subjective:   Julie Boone is a 89 y.o. female who presents for Medicare Annual (Subsequent) preventive examination.  Review of Systems    Virtual Visit via Telephone Note  I connected with  Julie Boone on 08/03/22 at  3:45 PM EDT by telephone and verified that I am speaking with the correct person using two identifiers.  Location: Patient: Home Provider: Office Persons participating in the virtual visit: patient/Nurse Health Advisor   I discussed the limitations, risks, security and privacy concerns of performing an evaluation and management service by telephone and the availability of in person appointments. The patient expressed understanding and agreed to proceed.  Interactive audio and video telecommunications were attempted between this nurse and patient, however failed, due to patient having technical difficulties OR patient did not have access to video capability.  We continued and completed visit with audio only.  Some vital signs may be absent or patient reported.   Tillie Rung, LPN  Cardiac Risk Factors include: advanced age (>49men, >64 women);hypertension     Objective:    Today's Vitals   08/03/22 1546  Weight: 161 lb (73 kg)  Height: 5\' 4"  (1.626 m)   Body mass index is 27.64 kg/m.     08/03/2022    3:56 PM 09/02/2021    6:07 AM 08/05/2021    3:25 PM 12/16/2020    5:58 AM 12/09/2020   10:55 AM 07/30/2020    3:40 PM 02/27/2019    2:17 PM  Advanced Directives  Does Patient Have a Medical Advance Directive? Yes Yes Yes Yes Yes Yes Yes  Type of Estate agent of [place];Living will Healthcare Power of [place];Out of facility DNR (pink MOST or yellow form);Living will Healthcare Power of [place];Living will Healthcare Power of Attorney Living will;Healthcare Power of [employer] Power of [employer] Power of [place];Living will  Does patient want to make changes to medical advance directive?  No -  Patient declined     No - Patient declined  Copy of Healthcare Power of Attorney in Chart? No - copy requested No - copy requested No - copy requested   No - copy requested No - copy requested  Would patient like information on creating a medical advance directive?  No - Patient declined         Current Medications (verified) Outpatient Encounter Medications as of 08/03/2022  Medication Sig   apixaban (ELIQUIS) 5 MG TABS tablet Take 1 tablet (5 mg total) by mouth 2 (two) times daily.   atenolol (TENORMIN) 50 MG tablet Take 1.5 tablets (75 mg total) by mouth daily.   Cholecalciferol (VITAMIN D-3 PO) Take 2,000 Units by mouth daily.   Cyanocobalamin (VITAMIN B12 SL) Place 1,000 mcg under the tongue daily.   cyclobenzaprine (FLEXERIL) 10 MG tablet Take 1 tablet (10 mg total) by mouth 2 (two) times daily as needed for muscle spasms.   lisinopril (ZESTRIL) 20 MG tablet Take 1 tablet (20 mg total) by mouth daily.   meclizine (ANTIVERT) 25 MG tablet 1/2 to 1  po up to every 8 hours as needed for vertigo  Fall precautions   ondansetron (ZOFRAN-ODT) 4 MG disintegrating tablet Take 1 tablet (4 mg total) by mouth every 8 (eight) hours as needed for nausea or vomiting.   UNABLE TO FIND Med Name: [place] leg cramps ho meo pathix med  Takes as needed for leg cramps says it works well   No facility-administered encounter medications on file as of 08/03/2022.  Allergies (verified) Shellfish allergy and Morphine and codeine   History: Past Medical History:  Diagnosis Date   Acute sinusitis with symptoms greater than 10 days 06/23/2013   Arthritis    Breast cancer (HCC)    mastectomy 3   neg ln    HOH (hard of hearing)    HTN (hypertension)    Wears glasses    Wears hearing aid    both ears   Past Surgical History:  Procedure Laterality Date   APPENDECTOMY  2006   BREAST BIOPSY Left 2012   COLONOSCOPY     HERNIA REPAIR     INSERTION OF MESH N/A 09/05/2013   Procedure: INSERTION OF MESH;   Surgeon: Clovis Pu. Cornett, MD;  Location: [facility];  Service: General;  Laterality: N/A;   MASTECTOMY Left 2013   TONSILLECTOMY AND ADENOIDECTOMY     Childhood   UMBILICAL HERNIA REPAIR N/A 09/05/2013   Procedure: HERNIA REPAIR UMBILICAL ADULT WITH MESH;  Surgeon: Clovis Pu. Cornett, MD;  Location: [facility];  Service: General;  Laterality: N/A;   Family History  Problem Relation Age of Onset   Hypertension Mother    Stroke Mother    Heart failure Mother    Lung disease Sister        smoker   Colon cancer Father        dx in his late 53's   Esophageal cancer Neg Hx    Inflammatory bowel disease Neg Hx    Liver disease Neg Hx    Pancreatic cancer Neg Hx    Stomach cancer Neg Hx    Social History   Socioeconomic History   Marital status: Widowed    Spouse name: Not on file   Number of children: 1   Years of education: 12   Highest education level: High school graduate  Occupational History    Comment: retired  Tobacco Use   Smoking status: Never   Smokeless tobacco: Never  Vaping Use   Vaping Use: Never used  Substance and Sexual Activity   Alcohol use: Yes    Alcohol/week: 0.0 standard drinks of alcohol    Comment: 1 time a month. has a sip ever now and then    Drug use: No   Sexual activity: Not on file  Other Topics Concern   Not on file  Social History Narrative   2 people living in the home   Dog Small [place] and [place]   From [place] moved from [place]  Adopted daughter nearby.      Neg ets  Wine with meals  No tob rd.   G0P0   Married husband [place] background  [place] descent.   Husband passed CHF and prostate cancer 2017   Now living with daughter         Social Determinants of Health   Financial Resource Strain: Low Risk  (08/03/2022)   Overall Financial Resource Strain (CARDIA)    Difficulty of Paying Living Expenses: Not hard at all  Food Insecurity: No Food Insecurity (08/03/2022)   Hunger Vital Sign     Worried About Running Out of Food in the Last Year: Never true    Ran Out of Food in the Last Year: Never true  Transportation Needs: No Transportation Needs (08/03/2022)   PRAPARE - Administrator, Civil Service (Medical): No    Lack of Transportation (Non-Medical): No  Physical Activity: Inactive (08/03/2022)   Exercise Vital Sign  Days of Exercise per Week: 0 days    Minutes of Exercise per Session: 0 min  Stress: No Stress Concern Present (08/03/2022)   [employer] of Occupational Health - Occupational Stress Questionnaire    Feeling of Stress : Not at all  Social Connections: Moderately Integrated (08/03/2022)   Social Connection and Isolation Panel [NHANES]    Frequency of Communication with Friends and Family: More than three times a week    Frequency of Social Gatherings with Friends and Family: More than three times a week    Attends Religious Services: More than 4 times per year    Active Member of [employer] or Organizations: Yes    Attends Banker Meetings: More than 4 times per year    Marital Status: Widowed    Tobacco Counseling Counseling given: Not Answered   Clinical Intake:  Pre-visit preparation completed: No  Pain : No/denies pain     BMI - recorded: 27.64 Nutritional Status: BMI 25 -29 Overweight Nutritional Risks: None Diabetes: No  How often do you need to have someone help you when you read instructions, pamphlets, or other written materials from your doctor or pharmacy?: 1 - Never  Diabetic?  No  Interpreter Needed?: No  Information entered by :: Theresa Mulligan LPN   Activities of Daily Living    08/03/2022    3:54 PM 08/05/2021    3:27 PM  In your present state of health, do you have any difficulty performing the following activities:  Hearing? 1 1  Comment Wears hearing aids has hearing aids  Vision? 0 0  Difficulty concentrating or making decisions? 0 0  Walking or climbing stairs? 0 0  Dressing or bathing? 0 0   Doing errands, shopping? 0 0  Preparing Food and eating ? N N  Using the Toilet? N N  In the past six months, have you accidently leaked urine? N N  Do you have problems with loss of bowel control? N N  Managing your Medications? N N  Managing your Finances? N N  Housekeeping or managing your Housekeeping? N N    Patient Care Team: Panosh, Neta Mends, MD as PCP - General (Internal Medicine) Parke Poisson, MD as PCP - Cardiology (Cardiology) Harriette Bouillon, MD as Consulting Physician (General Surgery) Merwyn Katos, DPM as Consulting Physician (Podiatry) Verner Chol, [facility] (Inactive) as Pharmacist (Pharmacist)  Indicate any recent Medical Services you may have received from other than [facility] providers in the past year (date may be approximate).     Assessment:   This is a routine wellness examination for [place].  Hearing/Vision screen Hearing Screening - Comments:: Wears hearing aids Vision Screening - Comments:: Wears rx glasses - up to date with routine eye exams with  Dr Hope Pigeon  Dietary issues and exercise activities discussed: Exercise limited by: None identified   Goals Addressed               This Visit's Progress     Patient stated (pt-stated)        I want to reach 89 yrs old       Depression Screen    08/03/2022    3:51 PM 09/03/2021    3:48 PM 08/05/2021    3:26 PM 07/21/2021   11:50 AM 07/30/2020    3:33 PM 05/21/2020   10:04 AM 02/27/2019    2:18 PM  PHQ 2/9 Scores  PHQ - 2 Score 0 0 0 0 0 0 0  PHQ- 9 Score  1         Fall Risk    08/03/2022    3:55 PM 09/03/2021    3:48 PM 08/05/2021    3:26 PM 07/21/2021   11:50 AM 07/30/2020    3:42 PM  Fall Risk   Falls in the past year? 0 0 0 0 0  Number falls in past yr: 0 0 0 0 0  Injury with Fall? 0 0 0 0 0  Risk for fall due to : No Fall Risks No Fall Risks Medication side effect No Fall Risks Impaired vision;Impaired balance/gait  Follow up Falls prevention discussed Falls prevention discussed Falls  evaluation completed;Education provided;Falls prevention discussed  Falls prevention discussed    FALL RISK PREVENTION PERTAINING TO THE HOME:  Any stairs in or around the home? No If so, are there any without handrails? No  Home free of loose throw rugs in walkways, pet beds, electrical cords, etc? Yes  Adequate lighting in your home to reduce risk of falls? Yes   ASSISTIVE DEVICES UTILIZED TO PREVENT FALLS:  Life alert? No  Use of a cane, walker or w/c? Yes  Grab bars in the bathroom? Yes  Shower chair or bench in shower? Yes  Elevated toilet seat or a handicapped toilet? Yes   TIMED UP AND GO:  Was the test performed? No . Audio Visit    Cognitive Function:        08/03/2022    3:57 PM 08/05/2021    3:29 PM 07/30/2020    3:49 PM 02/27/2019    2:21 PM  6CIT Screen  What Year? 0 points 0 points 0 points 0 points  What month? 0 points 0 points 0 points 0 points  What time? 0 points 0 points 0 points 0 points  Count back from 20 0 points 0 points 0 points 0 points  Months in reverse 0 points 0 points 0 points 0 points  Repeat phrase 0 points 10 points 0 points 0 points  Total Score 0 points 10 points 0 points 0 points    Immunizations Immunization History  Administered Date(s) Administered   PFIZER(Purple Top)SARS-COV-2 Vaccination 03/22/2019, 04/10/2019, 02/21/2020    TDAP status: Due, Education has been provided regarding the importance of this vaccine. Advised may receive this vaccine at local pharmacy or Health Dept. Aware to provide a copy of the vaccination record if obtained from local pharmacy or Health Dept. Verbalized acceptance and understanding.  Flu Vaccine status: Declined, Education has been provided regarding the importance of this vaccine but patient still declined. Advised may receive this vaccine at local pharmacy or Health Dept. Aware to provide a copy of the vaccination record if obtained from local pharmacy or Health Dept. Verbalized acceptance and  understanding.  Pneumococcal vaccine status: Declined,  Education has been provided regarding the importance of this vaccine but patient still declined. Advised may receive this vaccine at local pharmacy or Health Dept. Aware to provide a copy of the vaccination record if obtained from local pharmacy or Health Dept. Verbalized acceptance and understanding.   Covid-19 vaccine status: Completed vaccines  Qualifies for Shingles Vaccine? Yes   Zostavax completed No   Shingrix Completed?: No.    Education has been provided regarding the importance of this vaccine. Patient has been advised to call insurance company to determine out of pocket expense if they have not yet received this vaccine. Advised may also receive vaccine at local pharmacy or Health Dept. Verbalized acceptance and understanding.  Screening Tests Health  Maintenance  Topic Date Due   DTaP/Tdap/Td (1 - Tdap) Never done   Pneumonia Vaccine 43+ Years old (1 of 1 - PCV) 08/06/2022 (Originally 03/26/1989)   MAMMOGRAM  08/06/2022 (Originally 02/15/2018)   COVID-19 Vaccine (4 - 2023-24 season) 08/19/2022 (Originally 10/31/2021)   Zoster Vaccines- Shingrix (1 of 2) 11/03/2022 (Originally 03/25/1943)   INFLUENZA VACCINE  10/01/2022   Medicare Annual Wellness (AWV)  08/03/2023   DEXA SCAN  Completed   HPV VACCINES  Aged Out    Health Maintenance  Health Maintenance Due  Topic Date Due   DTaP/Tdap/Td (1 - Tdap) Never done    Colorectal cancer screening: No longer required.   Mammogram status: Ordered Patient declined. Pt provided with contact info and advised to call to schedule appt.   Bone Density status: Completed 02/10/10. Results reflect: Bone density results: OSTEOPOROSIS. Repeat every   years.  Lung Cancer Screening: (Low Dose CT Chest recommended if Age 42-80 years, 30 pack-year currently smoking OR have quit w/in 15years.) does not qualify.     Additional Screening:  Hepatitis C Screening: does not qualify; Completed    Vision Screening: Recommended annual ophthalmology exams for early detection of glaucoma and other disorders of the eye. Is the patient up to date with their annual eye exam?  Yes  Who is the provider or what is the name of the office in which the patient attends annual eye exams? Dr Hope Pigeon If pt is not established with a provider, would they like to be referred to a provider to establish care? No .   Dental Screening: Recommended annual dental exams for proper oral hygiene  Community Resource Referral / Chronic Care Management:  CRR required this visit?  No   CCM required this visit?  No      Plan:     I have personally reviewed and noted the following in the patient's chart:   Medical and social history Use of alcohol, tobacco or illicit drugs  Current medications and supplements including opioid prescriptions. Patient is not currently taking opioid prescriptions. Functional ability and status Nutritional status Physical activity Advanced directives List of other physicians Hospitalizations, surgeries, and ER visits in previous 12 months Vitals Screenings to include cognitive, depression, and falls Referrals and appointments  In addition, I have reviewed and discussed with patient certain preventive protocols, quality metrics, and best practice recommendations. A written personalized care plan for preventive services as well as general preventive health recommendations were provided to patient.     Tillie Rung, LPN   11/05/452   Nurse Notes: None

## 2022-08-03 NOTE — Patient Instructions (Addendum)
Julie Boone , Thank you for taking time to come for your Medicare Wellness Visit. I appreciate your ongoing commitment to your health goals. Please review the following plan we discussed and let me know if I can assist you in the future.   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 [employer]       Patient Stated      Maintain activities and take care of self       Patient Stated      08/05/2021, remain independent and stay healthy      Patient stated (pt-stated)      I want to reach 89 yrs old        This is a list of the screening recommended for you and due dates:  Health Maintenance  Topic Date Due   DTaP/Tdap/Td vaccine (1 - Tdap) Never done   Pneumonia Vaccine (1 of 1 - PCV) 08/06/2022*   Mammogram  08/06/2022*   COVID-19 Vaccine (4 - 2023-24 season) 08/19/2022*   Zoster (Shingles) Vaccine (1 of 2) 11/03/2022*   Flu Shot  10/01/2022   Medicare Annual Wellness Visit  08/03/2023   DEXA scan (bone density measurement)  Completed   HPV Vaccine  Aged Out  *Topic was postponed. The date shown is not the original due date.    Advanced directives: Please bring a copy of your health care power of attorney and living will to the office to be added to your chart at your convenience.   Conditions/risks identified: None  Next appointment: Follow up in one year for your annual wellness visit    Preventive Care 65 Years and Older, Female Preventive care refers to lifestyle choices and visits with your health care provider that can promote health and wellness. What does preventive care include? A yearly physical exam. This is also called an annual well check. Dental exams once or twice a year. Routine eye exams. Ask your health care provider how often you should have your eyes checked. Personal lifestyle choices, including: Daily care of your teeth and gums. Regular  physical activity. Eating a healthy diet. Avoiding tobacco and drug use. Limiting alcohol use. Practicing safe sex. Taking low-dose aspirin every day. Taking vitamin and mineral supplements as recommended by your health care provider. What happens during an annual well check? The services and screenings done by your health care provider during your annual well check will depend on your age, overall health, lifestyle risk factors, and family history of disease. Counseling  Your health care provider may ask you questions about your: Alcohol use. Tobacco use. Drug use. Emotional well-being. Home and relationship well-being. Sexual activity. Eating habits. History of falls. Memory and ability to understand (cognition). Work and work Astronomer. Reproductive health. Screening  You may have the following tests or measurements: Height, weight, and BMI. Blood pressure. Lipid and cholesterol levels. These may be checked every 5 years, or more frequently if you are over 46 years old. Skin check. Lung cancer screening. You may have this screening every year starting at age 56 if you have a 30-pack-year history of smoking and currently smoke or have quit within the past 15 years. Fecal occult blood test (FOBT) of the stool. You may have this test every year starting at age 42. Flexible sigmoidoscopy or colonoscopy. You may have a  sigmoidoscopy every 5 years or a colonoscopy every 10 years starting at age 51. Hepatitis C blood test. Hepatitis B blood test. Sexually transmitted disease (STD) testing. Diabetes screening. This is done by checking your blood sugar (glucose) after you have not eaten for a while (fasting). You may have this done every 1-3 years. Bone density scan. This is done to screen for osteoporosis. You may have this done starting at age 57. Mammogram. This may be done every 1-2 years. Talk to your health care provider about how often you should have regular mammograms. Talk  with your health care provider about your test results, treatment options, and if necessary, the need for more tests. Vaccines  Your health care provider may recommend certain vaccines, such as: Influenza vaccine. This is recommended every year. Tetanus, diphtheria, and acellular pertussis (Tdap, Td) vaccine. You may need a Td booster every 10 years. Zoster vaccine. You may need this after age 39. Pneumococcal 13-valent conjugate (PCV13) vaccine. One dose is recommended after age 7. Pneumococcal polysaccharide (PPSV23) vaccine. One dose is recommended after age 45. Talk to your health care provider about which screenings and vaccines you need and how often you need them. This information is not intended to replace advice given to you by your health care provider. Make sure you discuss any questions you have with your health care provider. Document Released: 03/15/2015 Document Revised: 11/06/2015 Document Reviewed: 12/18/2014 Elsevier Interactive Patient Education  2017 [employer].  Fall Prevention in the Home Falls can cause injuries. They can happen to people of all ages. There are many things you can do to make your home safe and to help prevent falls. What can I do on the outside of my home? Regularly fix the edges of walkways and driveways and fix any cracks. Remove anything that might make you trip as you walk through a door, such as a raised step or threshold. Trim any bushes or trees on the path to your home. Use bright outdoor lighting. Clear any walking paths of anything that might make someone trip, such as rocks or tools. Regularly check to see if handrails are loose or broken. Make sure that both sides of any steps have handrails. Any raised decks and porches should have guardrails on the edges. Have any leaves, snow, or ice cleared regularly. Use sand or salt on walking paths during winter. Clean up any spills in your garage right away. This includes oil or grease  spills. What can I do in the bathroom? Use night lights. Install grab bars by the toilet and in the tub and shower. Do not use towel bars as grab bars. Use non-skid mats or decals in the tub or shower. If you need to sit down in the shower, use a plastic, non-slip stool. Keep the floor dry. Clean up any water that spills on the floor as soon as it happens. Remove soap buildup in the tub or shower regularly. Attach bath mats securely with double-sided non-slip rug tape. Do not have throw rugs and other things on the floor that can make you trip. What can I do in the bedroom? Use night lights. Make sure that you have a light by your bed that is easy to reach. Do not use any sheets or blankets that are too big for your bed. They should not hang down onto the floor. Have a firm chair that has side arms. You can use this for support while you get dressed. Do not have throw rugs and other  things on the floor that can make you trip. What can I do in the kitchen? Clean up any spills right away. Avoid walking on wet floors. Keep items that you use a lot in easy-to-reach places. If you need to reach something above you, use a strong step stool that has a grab bar. Keep electrical cords out of the way. Do not use floor polish or wax that makes floors slippery. If you must use wax, use non-skid floor wax. Do not have throw rugs and other things on the floor that can make you trip. What can I do with my stairs? Do not leave any items on the stairs. Make sure that there are handrails on both sides of the stairs and use them. Fix handrails that are broken or loose. Make sure that handrails are as long as the stairways. Check any carpeting to make sure that it is firmly attached to the stairs. Fix any carpet that is loose or worn. Avoid having throw rugs at the top or bottom of the stairs. If you do have throw rugs, attach them to the floor with carpet tape. Make sure that you have a light switch at the  top of the stairs and the bottom of the stairs. If you do not have them, ask someone to add them for you. What else can I do to help prevent falls? Wear shoes that: Do not have high heels. Have rubber bottoms. Are comfortable and fit you well. Are closed at the toe. Do not wear sandals. If you use a stepladder: Make sure that it is fully opened. Do not climb a closed stepladder. Make sure that both sides of the stepladder are locked into place. Ask someone to hold it for you, if possible. Clearly mark and make sure that you can see: Any grab bars or handrails. First and last steps. Where the edge of each step is. Use tools that help you move around (mobility aids) if they are needed. These include: Canes. Walkers. Scooters. Crutches. Turn on the lights when you go into a dark area. Replace any light bulbs as soon as they burn out. Set up your furniture so you have a clear path. Avoid moving your furniture around. If any of your floors are uneven, fix them. If there are any pets around you, be aware of where they are. Review your medicines with your doctor. Some medicines can make you feel dizzy. This can increase your chance of falling. Ask your doctor what other things that you can do to help prevent falls. This information is not intended to replace advice given to you by your health care provider. Make sure you discuss any questions you have with your health care provider. Document Released: 12/13/2008 Document Revised: 07/25/2015 Document Reviewed: 03/23/2014 Elsevier Interactive Patient Education  2017 [employer].

## 2022-08-05 ENCOUNTER — Other Ambulatory Visit: Payer: Self-pay | Admitting: Internal Medicine

## 2022-08-05 MED ORDER — ATENOLOL 50 MG PO TABS: 75.0000 mg | ORAL_TABLET | Freq: Every day | ORAL | 0 refills | Status: DC

## 2022-10-05 ENCOUNTER — Other Ambulatory Visit: Payer: Self-pay | Admitting: Internal Medicine

## 2022-10-05 MED ORDER — MECLIZINE HCL 25 MG PO TABS: ORAL_TABLET | ORAL | 0 refills | Status: AC

## 2022-12-03 ENCOUNTER — Other Ambulatory Visit: Payer: Self-pay

## 2022-12-03 DIAGNOSIS — I480 Paroxysmal atrial fibrillation: Secondary | ICD-10-CM

## 2022-12-03 MED ORDER — APIXABAN 5 MG PO TABS: 5.0000 mg | ORAL_TABLET | Freq: Two times a day (BID) | ORAL | 5 refills | Status: DC

## 2022-12-03 NOTE — Telephone Encounter (Signed)
Prescription refill request for Eliquis received. Indication: Afib  Last office visit: 11/26/21 Jacques [facility])  Scr: 1.02 (06/02/22)  Age: 89 Weight: 73kg  Appropriate dose. Refill sent.

## 2022-12-08 ENCOUNTER — Ambulatory Visit (INDEPENDENT_AMBULATORY_CARE_PROVIDER_SITE_OTHER): Payer: Medicare Other | Admitting: Family Medicine

## 2022-12-08 ENCOUNTER — Encounter: Payer: Self-pay | Admitting: Family Medicine

## 2022-12-08 VITALS — BP 120/80 | HR 85 | Resp 16 | Ht 64.0 in | Wt 169.0 lb

## 2022-12-08 DIAGNOSIS — K219 Gastro-esophageal reflux disease without esophagitis: Secondary | ICD-10-CM | POA: Diagnosis not present

## 2022-12-08 DIAGNOSIS — R1032 Left lower quadrant pain: Secondary | ICD-10-CM | POA: Diagnosis not present

## 2022-12-08 DIAGNOSIS — R110 Nausea: Secondary | ICD-10-CM | POA: Diagnosis not present

## 2022-12-08 MED ORDER — OMEPRAZOLE 40 MG PO CPDR: 40.0000 mg | DELAYED_RELEASE_CAPSULE | Freq: Every day | ORAL | 0 refills | Status: DC

## 2022-12-08 NOTE — Patient Instructions (Addendum)
A few things to remember from today's visit:  Nausea without vomiting - Plan: Comprehensive metabolic panel, Comprehensive metabolic panel  LLQ abdominal pain - Plan: Comprehensive metabolic panel, CBC, CBC, Comprehensive metabolic panel, Urinalysis with Culture Reflex  ? Diverticulosis. Monitor for new symptoms. We are going to try Omeprazole 30 min before breakfast for 4-6 weeks to see if gastritis may be causing nausea.  If you need refills for medications you take chronically, please call your pharmacy. Do not use My Chart to request refills or for acute issues that need immediate attention. If you send a my chart message, it may take a few days to be addressed, specially if I am not in the office.  Please be sure medication list is accurate. If a new problem present, please set up appointment sooner than planned today.

## 2022-12-08 NOTE — Assessment & Plan Note (Signed)
Reporting occasional heartburn. Nausea could be related to this problem. She agrees with short course of omeprazole 40 mg, 30 minutes before breakfast for 4 to 6 weeks. GERD precautions recommended. Follow-up with PCP in 6 weeks, before if needed.

## 2022-12-08 NOTE — Progress Notes (Unsigned)
ACUTE VISIT Chief Complaint  Patient presents with  . Groin Pain    Left side, x a month. Comes & goes    HPI: [name] is a 89 y.o. female with a PMHx significant for HTN, GERD, breast cancer, atrial fibrillation on chronic anticoagulation, and pulmonary embolism, who is here today with her daughter complaining of aches in her left groin (points to LLQ) and stomach cramps for the last month.  She states that pain has been intermittent for a long time but become a constant ache for the last month.   Abdominal Pain This is a recurrent problem. The current episode started more than 1 month ago. The onset quality is gradual. The problem occurs intermittently. The problem has been waxing and waning. The pain is located in the LLQ. The pain is mild. The quality of the pain is aching and cramping. The abdominal pain radiates to the suprapubic region and RLQ. Associated symptoms include nausea. Pertinent negatives include no belching, constipation, dysuria, fever, flatus, headaches, hematochezia, hematuria, melena, vomiting or weight loss. Nothing aggravates the pain. The pain is relieved by Nothing. Prior diagnostic workup includes CT scan. Her past medical history is significant for GERD.  She has not identified exacerbating or alleviating factors. She reports that she has a history of constipation but this problem has greatly improved since she has been taking MiraLAX in the morning with her coffee.  She rates the pain as a 5/10, and says it does not change with eating or movement .  She says it sometimes radiates across her abdomen at night while in bed. She takes [place] an [place] aperitif, for her pain and says it helps significantly.   She denies blood in her stool, or black stool.  Her last bowel movement was yesterday.  She also denies any urinary symptoms outside of her baseline urinary frequency and urgency. Negative for changes in appetite or unusual fatigue.   Lab Results  Component Value Date   NA 138 06/02/2022   CL 104 06/02/2022   K 4.1 06/02/2022   CO2 25 06/02/2022   BUN 28 (H) 06/02/2022   CREATININE 1.02 06/02/2022   GFR 46.54 (L) 06/02/2022   CALCIUM 9.5 06/02/2022   ALBUMIN 4.0 07/21/2021   GLUCOSE 131 (H) 06/02/2022   Lab Results  Component Value Date   WBC 12.2 (H) 06/02/2022   HGB 14.4 06/02/2022   HCT 43.0 06/02/2022   MCV 97.3 06/02/2022   PLT 344.0 06/02/2022   She also complains of nausea when she wakes up before breakfast and after dinner at night, probably has been going on for longer than a month. She endorses some associated heartburn.  She has not tried OTC antiacid.  Abdominal/pelvis CT in 11/2018 and in 08/2021. 1. No acute findings in the abdomen or pelvis. Specifically, no findings to explain the patient's history of abdominal pain. 2. As noted on CT lumbar spine 97/4/23), there is trace free fluid in the pelvis and while unexpected in a 89 year old female, appearance is nonspecific. 3. Bilateral renal cysts. 4. Aortic Atherosclerosis (ICD10-I70.0)  Review of Systems  Constitutional:  Negative for fever and weight loss.  HENT:  Negative for mouth sores, sore throat and trouble swallowing.   Respiratory:  Negative for cough, shortness of breath and wheezing.   Cardiovascular:  Negative for chest pain.  Gastrointestinal:  Positive for abdominal pain and nausea. Negative for constipation, flatus, hematochezia, melena and vomiting.  Endocrine: Negative for cold intolerance and heat intolerance.  Genitourinary:  Negative for decreased urine volume, dysuria, hematuria, pelvic pain, vaginal bleeding and vaginal discharge.  Skin:  Negative for rash.  Neurological:  Negative for syncope and headaches.  Psychiatric/Behavioral:  Negative for confusion.   See other pertinent positives and negatives in HPI.  Current Outpatient Medications on File Prior to Visit  Medication Sig Dispense Refill  . apixaban (ELIQUIS)  5 MG TABS tablet Take 1 tablet (5 mg total) by mouth 2 (two) times daily. 60 tablet 5  . atenolol (TENORMIN) 50 MG tablet Take 1.5 tablets (75 mg total) by mouth daily. 135 tablet 0  . meclizine (ANTIVERT) 25 MG tablet 1/2 to 1  po up to every 8 hours as needed for vertigo  Fall precautions 20 tablet 0  . ondansetron (ZOFRAN-ODT) 4 MG disintegrating tablet Take 1 tablet (4 mg total) by mouth every 8 (eight) hours as needed for nausea or vomiting. 20 tablet 0  . UNABLE TO FIND Med Name: [place] leg cramps ho meo pathix med  Takes as needed for leg cramps says it works well     No current facility-administered medications on file prior to visit.    Past Medical History:  Diagnosis Date  . Acute sinusitis with symptoms greater than 10 days 06/23/2013  . Arthritis   . Breast cancer (HCC)    mastectomy 3   neg ln   . HOH (hard of hearing)   . HTN (hypertension)   . Wears glasses   . Wears hearing aid    both ears   Allergies  Allergen Reactions  . Shellfish Allergy Anaphylaxis and Swelling  . Morphine And Codeine Nausea And Vomiting    Social History   Socioeconomic History  . Marital status: Widowed    Spouse name: Not on file  . Number of children: 1  . Years of education: 54  . Highest education level: High school graduate  Occupational History    Comment: retired  Tobacco Use  . Smoking status: Never  . Smokeless tobacco: Never  Vaping Use  . Vaping status: Never Used  Substance and Sexual Activity  . Alcohol use: Yes    Alcohol/week: 0.0 standard drinks of alcohol    Comment: 1 time a month. has a sip ever now and then   . Drug use: No  . Sexual activity: Not on file  Other Topics Concern  . Not on file  Social History Narrative   2 people living in the home   Dog Small yorkie and [place]   From [place] moved from [place]  Adopted daughter nearby.      Neg ets  Wine with meals  No tob rd.   G0P0   Married husband [place] background  [place] descent.    Husband passed CHF and prostate cancer 2017   Now living with daughter         Social Determinants of Health   Financial Resource Strain: Low Risk  (08/03/2022)   Overall Financial Resource Strain (CARDIA)   . Difficulty of Paying Living Expenses: Not hard at all  Food Insecurity: No Food Insecurity (08/03/2022)   Hunger Vital Sign   . Worried About Programme researcher, broadcasting/film/video in the Last Year: Never true   . Ran Out of Food in the Last Year: Never true  Transportation Needs: No Transportation Needs (08/03/2022)   PRAPARE - Transportation   . Lack of Transportation (Medical): No   . Lack of Transportation (Non-Medical): No  Physical Activity: Inactive (08/03/2022)  Exercise Vital Sign   . Days of Exercise per Week: 0 days   . Minutes of Exercise per Session: 0 min  Stress: No Stress Concern Present (08/03/2022)   [employer] of Occupational Health - Occupational Stress Questionnaire   . Feeling of Stress : Not at all  Social Connections: Moderately Integrated (08/03/2022)   Social Connection and Isolation Panel [NHANES]   . Frequency of Communication with Friends and Family: More than three times a week   . Frequency of Social Gatherings with Friends and Family: More than three times a week   . Attends Religious Services: More than 4 times per year   . Active Member of Clubs or Organizations: Yes   . Attends Banker Meetings: More than 4 times per year   . Marital Status: Widowed    Vitals:   12/08/22 1555  BP: 120/80  Pulse: 85  Resp: 16  SpO2: 96%   Body mass index is 29.01 kg/m.  Physical Exam Vitals and nursing note reviewed.  Constitutional:      General: She is not in acute distress.    Appearance: She is well-developed.  HENT:     Head: Normocephalic and atraumatic.     Mouth/Throat:     Mouth: Mucous membranes are moist.  Eyes:     Conjunctiva/sclera: Conjunctivae normal.  Cardiovascular:     Rate and Rhythm: Normal rate. Rhythm irregular.     Heart  sounds: No murmur heard. Pulmonary:     Effort: Pulmonary effort is normal. No respiratory distress.     Breath sounds: Normal breath sounds.  Abdominal:     Palpations: Abdomen is soft. There is no mass.     Tenderness: There is no abdominal tenderness.  Musculoskeletal:     Lumbar back: No tenderness.     Left hip: No tenderness.     Right lower leg: No edema.     Left lower leg: No edema.  Skin:    General: Skin is warm.     Findings: No erythema or rash.  Neurological:     Mental Status: She is alert and oriented to person, place, and time.     Comments: Mildly unstable gait, daughter helps with holding her arm.  Psychiatric:        Mood and Affect: Mood and affect normal.  ASSESSMENT AND PLAN:  Ms. Clinkscale was seen today for an ache in her left groin and stomach cramps.  *** LLQ abdominal pain Pain seems to be more constant for the past month but in general seems to be a chronic problem. Not reproducible on examination today. We discussed possible etiologies and review reports of abdomen/pelvis CT is done in the past. ?  Diverticulosis. Examination today and history do not suggest an acute infectious process. She agrees with holding on abdominal imaging for now. Will obtain some blood work today. Instructed to monitor for new symptoms and educated about warning signs.  -     Comprehensive metabolic panel; Future -     CBC; Future -     Urinalysis w microscopic + reflex cultur  Nausea without vomiting Also seems to be going on for over a month. We reviewed possible causes, it could be related to GERD/dyspepsia. PPI started today.  -     Comprehensive metabolic panel; Future  Gastroesophageal reflux disease, unspecified whether esophagitis present Assessment & Plan: Reporting occasional heartburn. Nausea could be related to this problem. She agrees with short course of omeprazole 40 mg,  30 minutes before breakfast for 4 to 6 weeks. GERD precautions  recommended. Follow-up with PCP in 6 weeks, before if needed.  Orders: -     Omeprazole; Take 1 capsule (40 mg total) by mouth daily.  Dispense: 60 capsule; Refill: 0  Return in about 6 weeks (around 01/19/2023), or LLQ abd pain and nasuea woith PCP.  I, Rolla Etienne Wierda, acting as a scribe for Simran Bomkamp [place], MD., have documented all relevant documentation on the behalf of Melvin Marmo [place], MD, as directed by  Lorelle Macaluso [place], MD while in the presence of Julie Boone [place], MD.   I, Valon Glasscock [place], MD, have reviewed all documentation for this visit. The documentation on 12/08/22 for the exam, diagnosis, procedures, and orders are all accurate and complete.  Williams Dietrick G. [place], MD  [facility]. [place] office.

## 2022-12-09 ENCOUNTER — Other Ambulatory Visit: Payer: Self-pay

## 2022-12-09 LAB — COMPREHENSIVE METABOLIC PANEL
ALT: 12 U/L (ref 0–35)
AST: 16 U/L (ref 0–37)
Albumin: 4 g/dL (ref 3.5–5.2)
Alkaline Phosphatase: 85 U/L (ref 39–117)
BUN: 25 mg/dL — ABNORMAL HIGH (ref 6–23)
CO2: 23 meq/L (ref 19–32)
Calcium: 9.8 mg/dL (ref 8.4–10.5)
Chloride: 104 meq/L (ref 96–112)
Creatinine, Ser: 0.89 mg/dL (ref 0.40–1.20)
GFR: 54.61 mL/min — ABNORMAL LOW (ref >60.00)
Glucose, Bld: 157 mg/dL — ABNORMAL HIGH (ref 70–99)
Potassium: 4.3 meq/L (ref 3.5–5.1)
Sodium: 139 meq/L (ref 135–145)
Total Bilirubin: 0.5 mg/dL (ref 0.2–1.2)
Total Protein: 6.2 g/dL (ref 6.0–8.3)

## 2022-12-09 LAB — CBC
HCT: 47.8 % — ABNORMAL HIGH (ref 36.0–46.0)
Hemoglobin: 15.8 g/dL — ABNORMAL HIGH (ref 12.0–15.0)
MCHC: 33 g/dL (ref 30.0–36.0)
MCV: 98.1 fL (ref 78.0–100.0)
Platelets: 333 10*3/uL (ref 150.0–400.0)
RBC: 4.87 Mil/uL (ref 3.87–5.11)
RDW: 12.8 % (ref 11.5–15.5)
WBC: 12.6 10*3/uL — ABNORMAL HIGH (ref 4.0–10.5)

## 2022-12-09 MED ORDER — SULFAMETHOXAZOLE-TRIMETHOPRIM 800-160 MG PO TABS: 1.0000 | ORAL_TABLET | Freq: Two times a day (BID) | ORAL | 0 refills | Status: AC

## 2022-12-10 LAB — URINALYSIS W MICROSCOPIC + REFLEX CULTURE
Bacteria, UA: NONE SEEN /HPF
Bilirubin Urine: NEGATIVE
Glucose, UA: NEGATIVE
Hyaline Cast: NONE SEEN /[LPF]
Ketones, ur: NEGATIVE
Nitrites, Initial: NEGATIVE
Specific Gravity, Urine: 1.022 (ref 1.001–1.035)
pH: 5.5 (ref 5.0–8.0)

## 2022-12-10 LAB — URINE CULTURE
MICRO NUMBER:: 15568922
Result:: NO GROWTH
SPECIMEN QUALITY:: ADEQUATE

## 2022-12-10 LAB — CULTURE INDICATED

## 2023-01-07 ENCOUNTER — Encounter (INDEPENDENT_AMBULATORY_CARE_PROVIDER_SITE_OTHER): Payer: Self-pay

## 2023-01-07 ENCOUNTER — Ambulatory Visit (INDEPENDENT_AMBULATORY_CARE_PROVIDER_SITE_OTHER): Payer: Medicare Other | Admitting: Otolaryngology

## 2023-01-07 VITALS — Ht 64.0 in | Wt 168.0 lb

## 2023-01-07 DIAGNOSIS — H6123 Impacted cerumen, bilateral: Secondary | ICD-10-CM

## 2023-01-07 DIAGNOSIS — H9202 Otalgia, left ear: Secondary | ICD-10-CM

## 2023-01-07 NOTE — Progress Notes (Signed)
Patient ID: Julie Boone, female   DOB: 05/30/1924, 89 y.o.   MRN: [id number]  CC: Left ear pain  HPI: The patient is a 89 year old female who presents today complaining of left ear pain for the past 2 weeks.  The severity of the left ear pain has increased over the past 2 days.  She has self treated with Tylenol.  She also complains of muffled hearing bilaterally.  She has a history of recurrent cerumen impaction.  She denies any otorrhea or vertigo.  Exam: General: Communicates without difficulty, well nourished, no acute distress. Head: Normocephalic, no evidence injury, no tenderness, facial buttresses intact without stepoff. Face/sinus: No tenderness to palpation and percussion. Facial movement is normal and symmetric. Eyes: PERRL, EOMI. No scleral icterus, conjunctivae clear. Neuro: CN II exam reveals vision grossly intact.  No nystagmus at any point of gaze. Ears: Auricles well formed without lesions.  Bilateral cerumen impaction.  The left TMJ is tender to palpation.  Nose: External evaluation reveals normal support and skin without lesions.  Dorsum is intact.  Anterior rhinoscopy reveals congested mucosa over anterior aspect of inferior turbinates and intact septum.  No purulence noted. Oral:  Oral cavity and oropharynx are intact, symmetric, without erythema or edema.  Mucosa is moist without lesions. Neck: Full range of motion without pain.  There is no significant lymphadenopathy.  No masses palpable.  Thyroid bed within normal limits to palpation.  Parotid glands and submandibular glands equal bilaterally without mass.  Trachea is midline. Neuro:  CN 2-12 grossly intact.    Procedure: Bilateral cerumen disimpaction Anesthesia: None Description: Under the operating microscope, the cerumen is carefully removed with a combination of cerumen currette, alligator forceps, and suction catheters.  After the cerumen is removed, the TMs are noted to be normal.  No mass, erythema, or lesions. The  patient tolerated the procedure well.    Assessment: 1.  Referred left otalgia, likely secondary to left TMJ disorder. 2.  Bilateral cerumen impaction.  After the disimpaction procedure, both ear canals, tympanic membranes, and middle ear spaces are all normal.  Plan: 1.  Otomicroscopy with bilateral cerumen disimpaction. 2.  The physical exam findings are reviewed with the patient. 3.  Robaxin nightly to treat her referred otalgia/TMJ disorder. 4.  NSAID as needed. 5.  The patient will return for reevaluation in 3 months.

## 2023-01-25 ENCOUNTER — Other Ambulatory Visit: Payer: Self-pay | Admitting: Internal Medicine

## 2023-01-26 ENCOUNTER — Ambulatory Visit (INDEPENDENT_AMBULATORY_CARE_PROVIDER_SITE_OTHER): Payer: Medicare Other

## 2023-02-02 ENCOUNTER — Other Ambulatory Visit: Payer: Self-pay | Admitting: Family Medicine

## 2023-02-02 DIAGNOSIS — K219 Gastro-esophageal reflux disease without esophagitis: Secondary | ICD-10-CM

## 2023-04-26 ENCOUNTER — Telehealth (INDEPENDENT_AMBULATORY_CARE_PROVIDER_SITE_OTHER): Payer: Self-pay | Admitting: Otolaryngology

## 2023-04-26 NOTE — Telephone Encounter (Signed)
 Reminder Call: Date: 04/27/2023 Status: Sch  Time: 1:40 PM [address] [place], [place] [address]  Left voicemail w/time and location

## 2023-04-27 ENCOUNTER — Ambulatory Visit (INDEPENDENT_AMBULATORY_CARE_PROVIDER_SITE_OTHER): Payer: Medicare Other | Admitting: Otolaryngology

## 2023-04-27 VITALS — BP 175/82 | HR 70 | Ht 65.0 in | Wt 174.0 lb

## 2023-04-27 DIAGNOSIS — H6123 Impacted cerumen, bilateral: Secondary | ICD-10-CM | POA: Diagnosis not present

## 2023-04-27 DIAGNOSIS — H9202 Otalgia, left ear: Secondary | ICD-10-CM

## 2023-04-27 NOTE — Progress Notes (Unsigned)
 Patient ID: Julie Boone, female   DOB: [birth year], 89 y.o.   MRN: [id number]  Follow-up: Referred left otalgia, recurrent cerumen impaction  HPI: The patient is a 89 year old female who presents today for her follow-up evaluation.  She was last seen in November 2024.  At that time, she was complaining of left ear pain and hearing loss.  She was noted to have bilateral cerumen impaction.  Her left otalgia was likely a result of her left TMJ disorder.  The patient was treated with cerumen disimpaction and Robaxin.  The patient returns today reporting resolution of her left ear pain.  She has noted increasing clogging sensation in her ears.  She denies any significant otalgia, otorrhea, or vertigo.  Exam: General: Communicates without difficulty, well nourished, no acute distress. Head: Normocephalic, no evidence injury, no tenderness, facial buttresses intact without stepoff. Face/sinus: No tenderness to palpation and percussion. Facial movement is normal and symmetric. Eyes: PERRL, EOMI. No scleral icterus, conjunctivae clear. Neuro: CN II exam reveals vision grossly intact.  No nystagmus at any point of gaze. Ears: Auricles well formed without lesions.  Bilateral cerumen impaction.  Nose: External evaluation reveals normal support and skin without lesions.  Dorsum is intact.  Anterior rhinoscopy reveals congested mucosa over anterior aspect of inferior turbinates and intact septum.  No purulence noted. Oral:  Oral cavity and oropharynx are intact, symmetric, without erythema or edema.  Mucosa is moist without lesions. Neck: Full range of motion without pain.  There is no significant lymphadenopathy.  No masses palpable.  Thyroid bed within normal limits to palpation.  Parotid glands and submandibular glands equal bilaterally without mass.  Trachea is midline. Neuro:  CN 2-12 grossly intact.     Procedure: Bilateral cerumen disimpaction Anesthesia: None Description: Under the operating microscope,  the cerumen is carefully removed with a combination of cerumen currette, alligator forceps, and suction catheters.  After the cerumen is removed, the TMs are noted to be normal.  No mass, erythema, or lesions. The patient tolerated the procedure well.    Assessment: 1.  The patient's left referred otalgia has resolved. 2.  Recurrent bilateral cerumen impaction.  After the disimpaction procedure, both tympanic membranes and middle ear spaces are noted to be normal.  Plan: 1.  Otomicroscopy with bilateral cerumen disimpaction. 2.  The physical exam findings are reviewed with the patient. 3.  The patient is instructed not to use Q-tips to clean the ear canals. 4.  The patient will return for reevaluation in 3 months.

## 2023-04-30 ENCOUNTER — Other Ambulatory Visit: Payer: Self-pay

## 2023-04-30 MED ORDER — ATENOLOL 50 MG PO TABS: 75.0000 mg | ORAL_TABLET | Freq: Every day | ORAL | 0 refills | Status: AC

## 2023-06-10 ENCOUNTER — Ambulatory Visit (INDEPENDENT_AMBULATORY_CARE_PROVIDER_SITE_OTHER): Admitting: Internal Medicine

## 2023-06-10 ENCOUNTER — Telehealth: Payer: Self-pay

## 2023-06-10 ENCOUNTER — Other Ambulatory Visit (HOSPITAL_COMMUNITY): Payer: Self-pay

## 2023-06-10 ENCOUNTER — Encounter: Payer: Self-pay | Admitting: Internal Medicine

## 2023-06-10 VITALS — BP 138/84 | HR 70 | Temp 97.7°F | Ht 65.0 in | Wt 169.6 lb

## 2023-06-10 DIAGNOSIS — L308 Other specified dermatitis: Secondary | ICD-10-CM

## 2023-06-10 DIAGNOSIS — R54 Age-related physical debility: Secondary | ICD-10-CM | POA: Diagnosis not present

## 2023-06-10 DIAGNOSIS — G629 Polyneuropathy, unspecified: Secondary | ICD-10-CM

## 2023-06-10 LAB — CBC WITH DIFFERENTIAL/PLATELET
Basophils Absolute: 0.1 10*3/uL (ref 0.0–0.1)
Basophils Relative: 0.6 % (ref 0.0–3.0)
Eosinophils Absolute: 0.5 10*3/uL (ref 0.0–0.7)
Eosinophils Relative: 4.6 % (ref 0.0–5.0)
HCT: 48.2 % — ABNORMAL HIGH (ref 36.0–46.0)
Hemoglobin: 16 g/dL — ABNORMAL HIGH (ref 12.0–15.0)
Lymphocytes Relative: 18.3 % (ref 12.0–46.0)
Lymphs Abs: 1.8 10*3/uL (ref 0.7–4.0)
MCHC: 33.1 g/dL (ref 30.0–36.0)
MCV: 98 fl (ref 78.0–100.0)
Monocytes Absolute: 1 10*3/uL (ref 0.1–1.0)
Monocytes Relative: 10.5 % (ref 3.0–12.0)
Neutro Abs: 6.6 10*3/uL (ref 1.4–7.7)
Neutrophils Relative %: 66 % (ref 43.0–77.0)
Platelets: 302 10*3/uL (ref 150.0–400.0)
RBC: 4.92 Mil/uL (ref 3.87–5.11)
RDW: 13.3 % (ref 11.5–15.5)
WBC: 10 10*3/uL (ref 4.0–10.5)

## 2023-06-10 LAB — BASIC METABOLIC PANEL WITH GFR
BUN: 20 mg/dL (ref 6–23)
CO2: 22 meq/L (ref 19–32)
Calcium: 9.8 mg/dL (ref 8.4–10.5)
Chloride: 103 meq/L (ref 96–112)
Creatinine, Ser: 0.89 mg/dL (ref 0.40–1.20)
GFR: 54.42 mL/min — ABNORMAL LOW (ref >60.00)
Glucose, Bld: 158 mg/dL — ABNORMAL HIGH (ref 70–99)
Potassium: 4.3 meq/L (ref 3.5–5.1)
Sodium: 137 meq/L (ref 135–145)

## 2023-06-10 LAB — HEPATIC FUNCTION PANEL
ALT: 18 U/L (ref 0–35)
AST: 23 U/L (ref 0–37)
Albumin: 4.2 g/dL (ref 3.5–5.2)
Alkaline Phosphatase: 94 U/L (ref 39–117)
Bilirubin, Direct: 0.1 mg/dL (ref 0.0–0.3)
Total Bilirubin: 0.6 mg/dL (ref 0.2–1.2)
Total Protein: 6.8 g/dL (ref 6.0–8.3)

## 2023-06-10 LAB — VITAMIN B12: Vitamin B-12: >1537 pg/mL — ABNORMAL HIGH (ref 211–911)

## 2023-06-10 LAB — TSH: TSH: 2.37 u[IU]/mL (ref 0.35–5.50)

## 2023-06-10 LAB — HEMOGLOBIN A1C: Hgb A1c MFr Bld: 6.8 % — ABNORMAL HIGH (ref 4.6–6.5)

## 2023-06-10 MED ORDER — TRIAMCINOLONE ACETONIDE 0.1 % EX CREA: 1.0000 | TOPICAL_CREAM | Freq: Two times a day (BID) | CUTANEOUS | 1 refills | Status: AC

## 2023-06-10 MED ORDER — HYDROXYZINE HCL 10 MG PO TABS: 10.0000 mg | ORAL_TABLET | Freq: Three times a day (TID) | ORAL | 3 refills | Status: AC | PRN

## 2023-06-10 NOTE — Patient Instructions (Signed)
 I think this could be a combo of   skin issues  and neuropathy sx but checking out possible other causes .  Add steroid twice a day for 2 weeks and then as needed  with moisturizer such as Aquaphor.   Try hydroxyzine at  night 10 - 20 mg   ( used for itching and anxiety)   Caution with drowsiness .   Consider dermatology   even though you dont want to .

## 2023-06-10 NOTE — Telephone Encounter (Signed)
 Pharmacy Patient Advocate Encounter   Received notification from CoverMyMeds that prior authorization for hydrOXYzine HCl 10MG  tablets is required/requested.   Insurance verification completed.   The patient is insured through [facility] .   Per test claim: PA required; PA started via CoverMyMeds. KEY [id number] . Waiting for clinical questions to populate.

## 2023-06-10 NOTE — Telephone Encounter (Signed)
 Pharmacy Patient Advocate Encounter   Received notification from Onbase that prior authorization for hydrOXYzine HCl 10MG  tablets is required/requested.   Insurance verification completed.   The patient is insured through [facility] .   Per test claim: PA required; PA submitted to above mentioned insurance via CoverMyMeds Key/confirmation #/EOC [facility] Status is pending

## 2023-06-10 NOTE — Progress Notes (Signed)
 Chief Complaint  Patient presents with   Rash    Pt is here with daughter. Pt reports it is on going issues. At least 6 months. Sx started with tingling on feet then hot and itch radiates up to legs. Relief with Calamine lotion. Sx locate on legs and arms.     HPI: Julie Boone 89 y.o. come in for  Says she has the "[place] plague " skin conditions  over the years  Itches at [employer] lewer legs upper ? Calamine and  benadryl can help   Skin  problems  otc hcs   minimal help .  No bleeding  new sx using all types of intervnetins not helpful .  ROS: See pertinent positives and negatives per HPI. Hx of chonic vein  hx of pe  on anticoagulant Past Medical History:  Diagnosis Date   Acute sinusitis with symptoms greater than 10 days 06/23/2013   Arthritis    Breast cancer (HCC)    mastectomy 3   neg ln    HOH (hard of hearing)    HTN (hypertension)    Wears glasses    Wears hearing aid    both ears    Family History  Problem Relation Age of Onset   Hypertension Mother    Stroke Mother    Heart failure Mother    Lung disease Sister        smoker   Colon cancer Father        dx in his late 27's   Esophageal cancer Neg Hx    Inflammatory bowel disease Neg Hx    Liver disease Neg Hx    Pancreatic cancer Neg Hx    Stomach cancer Neg Hx     Social History   Socioeconomic History   Marital status: Widowed    Spouse name: Not on file   Number of children: 1   Years of education: 12   Highest education level: High school graduate  Occupational History    Comment: retired  Tobacco Use   Smoking status: Never   Smokeless tobacco: Never  Vaping Use   Vaping status: Never Used  Substance and Sexual Activity   Alcohol use: Yes    Alcohol/week: 0.0 standard drinks of alcohol    Comment: 1 time a month. has a sip ever now and then    Drug use: No   Sexual activity: Not on file  Other Topics Concern   Not on file  Social History Narrative   2 people living in  the home   Dog Small yorkie and [place]   From [place] moved from [place]  Adopted daughter nearby.      Neg ets  Wine with meals  No tob rd.   G0P0   Married husband [place] background  Herself [place] descent.   Husband passed CHF and prostate cancer 2017   Now living with daughter         Social Drivers of Health   Financial Resource Strain: Low Risk  (08/03/2022)   Overall Financial Resource Strain (CARDIA)    Difficulty of Paying Living Expenses: Not hard at all  Food Insecurity: No Food Insecurity (08/03/2022)   Hunger Vital Sign    Worried About Running Out of Food in the Last Year: Never true    Ran Out of Food in the Last Year: Never true  Transportation Needs: No Transportation Needs (08/03/2022)   PRAPARE - Administrator, Civil Service (Medical): No  Lack of Transportation (Non-Medical): No  Physical Activity: Inactive (08/03/2022)   Exercise Vital Sign    Days of Exercise per Week: 0 days    Minutes of Exercise per Session: 0 min  Stress: No Stress Concern Present (08/03/2022)   [employer] of Occupational Health - Occupational Stress Questionnaire    Feeling of Stress : Not at all  Social Connections: Moderately Integrated (08/03/2022)   Social Connection and Isolation Panel [NHANES]    Frequency of Communication with Friends and Family: More than three times a week    Frequency of Social Gatherings with Friends and Family: More than three times a week    Attends Religious Services: More than 4 times per year    Active Member of [employer] or Organizations: Yes    Attends Banker Meetings: More than 4 times per year    Marital Status: Widowed    Outpatient Medications Prior to Visit  Medication Sig Dispense Refill   apixaban (ELIQUIS) 5 MG TABS tablet Take 1 tablet (5 mg total) by mouth 2 (two) times daily. 60 tablet 5   atenolol (TENORMIN) 50 MG tablet Take 1.5 tablets (75 mg total) by mouth daily. 135 tablet 0   meclizine (ANTIVERT) 25 MG tablet  1/2 to 1  po up to every 8 hours as needed for vertigo  Fall precautions 20 tablet 0   omeprazole (PRILOSEC) 40 MG capsule TAKE 1 CAPSULE BY MOUTH DAILY 60 capsule 0   ondansetron (ZOFRAN-ODT) 4 MG disintegrating tablet Take 1 tablet (4 mg total) by mouth every 8 (eight) hours as needed for nausea or vomiting. 20 tablet 0   UNABLE TO FIND Med Name: [place] leg cramps ho meo pathix med  Takes as needed for leg cramps says it works well     No facility-administered medications prior to visit.     EXAM:  BP 138/84 (BP Location: Left Arm, Patient Position: Sitting, Cuff Size: Large)   Pulse 70   Temp 97.7 F (36.5 C) (Oral)   Ht 5\' 5"  (1.651 m)   Wt 169 lb 9.6 oz (76.9 kg)   SpO2 96%   BMI 28.22 kg/m   Body mass index is 28.22 kg/m.  GENERAL: vitals reviewed and listed above, alert, oriented, appears well hydrated and in no acute distress delight ful indepnedent  in nad.  HEENT: atraumatic, conjunctiva  clear, no obvious abnormalities on inspection of external nose and ears  NECK: no obvious masses on inspection palpation  LUNGS: clear to auscultation bilaterally, no wheezes, rales or rhonchi, good air movement CV: HRRR, no clubbing cyanosis  legs  feet purlis color but nl warmth  and vein seen  throughout   lower ext mild edema skin changes no weeping scratching areas noted no infection . Dry skin and number or sks  back no redness  ant chest some keratosis  right dorsal hand  a small verrucous horn?  No bruising or petechia  walks with cane and  cognition intact  MS: moves all extremities without noticeable focal  abnormality PSYCH: pleasant and cooperative, no obvious depression or anxiety   Lab Results  Component Value Date   WBC 12.6 (H) 12/08/2022   HGB 15.8 (H) 12/08/2022   HCT 47.8 (H) 12/08/2022   PLT 333.0 12/08/2022   GLUCOSE 157 (H) 12/08/2022   CHOL 187 05/21/2020   TRIG 176.0 (H) 05/21/2020   HDL 35.30 (L) 05/21/2020   LDLDIRECT 103.0 07/10/2019   LDLCALC 117  (H) 05/21/2020   ALT  12 12/08/2022   AST 16 12/08/2022   NA 139 12/08/2022   K 4.3 12/08/2022   CL 104 12/08/2022   CREATININE 0.89 12/08/2022   BUN 25 (H) 12/08/2022   CO2 23 12/08/2022   TSH 2.59 07/21/2021   INR 0.93 04/01/2018   HGBA1C 6.2 06/02/2022   BP Readings from Last 3 Encounters:  06/10/23 138/84  04/27/23 (!) 175/82  12/08/22 120/80   Ct abd 7 23 no sig  finding  ASSESSMENT AND PLAN:  Discussed the following assessment and plan:  Pruritic dermatitis - Plan: CBC with Differential/Platelet, Basic metabolic panel with GFR, Hemoglobin A1c, Hepatic function panel, Vitamin B12, TSH  Neuropathy  ? - Plan: CBC with Differential/Platelet, Basic metabolic panel with GFR, Hemoglobin A1c, Hepatic function panel, Vitamin B12, TSH  Advanced age Numbers keratosis also  right hand may be a horn .   Avoid drying etoh on skin . Add higher dose steroid    Recheck blood work count to check for erythrocytosis other ,  hyperemia on distal feet  but  no evidence of vascular compromise  suspect neuropathy type sx ?  -Patient advised to return or notify health care team  if  new concerns arise.  Patient Instructions  I think this could be a combo of   skin issues  and neuropathy sx but checking out possible other causes .  Add steroid twice a day for 2 weeks and then as needed  with moisturizer such as Aquaphor.   Try hydroxyzine at  night 10 - 20 mg   ( used for itching and anxiety)   Caution with drowsiness .   Consider dermatology   even though you dont want to .   Neta Mends. Shanterria Franta M.D.

## 2023-06-11 ENCOUNTER — Other Ambulatory Visit: Payer: Self-pay | Admitting: Internal Medicine

## 2023-06-11 ENCOUNTER — Encounter: Payer: Self-pay | Admitting: Internal Medicine

## 2023-06-11 DIAGNOSIS — D582 Other hemoglobinopathies: Secondary | ICD-10-CM

## 2023-06-11 DIAGNOSIS — L308 Other specified dermatitis: Secondary | ICD-10-CM

## 2023-06-11 DIAGNOSIS — R739 Hyperglycemia, unspecified: Secondary | ICD-10-CM

## 2023-06-11 NOTE — Progress Notes (Unsigned)
 Future orders to be done in about 3 months  fu elevated hg  and BG

## 2023-06-11 NOTE — Telephone Encounter (Signed)
 Pharmacy Patient Advocate Encounter  Received notification from [facility] that Prior Authorization for hydrOXYzine HCl 10MG  tablets  has been APPROVED from 06/10/23 to 06/09/24

## 2023-06-11 NOTE — Progress Notes (Signed)
 So sugar is now in early diabetic range Hemoglobin in high side  many causes possible  .  Either could  relate  to itching but not definitive  at all.  So limit  sugars sweets  as possible ( dont have to be spartan about this)  Want to  repeat some labs in 3 months  hg A1c cbcdiff  erythopoetin and Jak 2 mutation urine microabl ratio and UA ( I will place orders )  dont have to fast .

## 2023-06-14 NOTE — Telephone Encounter (Signed)
 Attempted to reach pt. Left a detail message on the approval of Hydroxyzine PA. To give us  a call back if have any questions.

## 2023-07-15 ENCOUNTER — Other Ambulatory Visit: Payer: Self-pay

## 2023-07-15 DIAGNOSIS — I480 Paroxysmal atrial fibrillation: Secondary | ICD-10-CM

## 2023-07-15 NOTE — Telephone Encounter (Signed)
 Prescription refill request for Eliquis  received. Indication: Afib  Last office visit: 11/26/21 Julie Boone)  Scr: 0.89 (06/10/23)  Age: 89 Weight: 76.9kg   Office visit overdue. Message sent to schedulers.

## 2023-07-16 MED ORDER — APIXABAN 5 MG PO TABS
5.0000 mg | ORAL_TABLET | Freq: Two times a day (BID) | ORAL | 0 refills | Status: AC
Start: 2023-07-16 — End: ?

## 2023-07-16 NOTE — Telephone Encounter (Signed)
Called pt, no answer. Left message on voicemail with call back number.

## 2023-07-27 ENCOUNTER — Telehealth: Payer: Self-pay | Admitting: Internal Medicine

## 2023-07-27 ENCOUNTER — Encounter: Payer: Self-pay | Admitting: Internal Medicine

## 2023-07-27 NOTE — Telephone Encounter (Signed)
 Patient contacted 3x with no success, will send mychart message

## 2023-07-27 NOTE — Telephone Encounter (Signed)
-----   Message from Nurse Bailey Bolus sent at 07/15/2023  3:46 PM EDT ----- Regarding: office visit overdue Can you please contact pt and schedule overdue office visit. Pt need appt for medication refills.   Abraham Abo, RN

## 2023-07-29 ENCOUNTER — Ambulatory Visit (INDEPENDENT_AMBULATORY_CARE_PROVIDER_SITE_OTHER): Payer: Medicare Other | Admitting: Otolaryngology

## 2023-07-29 ENCOUNTER — Encounter (INDEPENDENT_AMBULATORY_CARE_PROVIDER_SITE_OTHER): Payer: Self-pay | Admitting: Otolaryngology

## 2023-07-29 VITALS — BP 177/92 | HR 77

## 2023-07-29 DIAGNOSIS — H9209 Otalgia, unspecified ear: Secondary | ICD-10-CM | POA: Diagnosis not present

## 2023-07-29 DIAGNOSIS — H9202 Otalgia, left ear: Secondary | ICD-10-CM

## 2023-07-29 DIAGNOSIS — H903 Sensorineural hearing loss, bilateral: Secondary | ICD-10-CM | POA: Diagnosis not present

## 2023-07-29 DIAGNOSIS — H6123 Impacted cerumen, bilateral: Secondary | ICD-10-CM

## 2023-07-29 DIAGNOSIS — M26629 Arthralgia of temporomandibular joint, unspecified side: Secondary | ICD-10-CM

## 2023-07-30 DIAGNOSIS — H903 Sensorineural hearing loss, bilateral: Secondary | ICD-10-CM | POA: Insufficient documentation

## 2023-07-30 NOTE — Progress Notes (Signed)
 Patient ID: Julie Boone, female   DOB: 09/25/1924, 89 y.o.   MRN: [id number]  Follow-up: Hearing loss, referred otalgia  HPI: The patient is a 89 year old female who returns today for follow-up evaluation.  The patient has a history of bilateral symmetric sensorineural hearing loss and referred otalgia, secondary to her TMJ disorder.  She was successfully treated with Robaxin and hearing amplification.  The patient returns today complaining of hearing difficulty.  She was recently noted to have recurrent cerumen impaction.  Currently she denies any otalgia or otorrhea.  Exam: General: Communicates without difficulty, well nourished, no acute distress. Head: Normocephalic, no evidence injury, no tenderness, facial buttresses intact without stepoff. Face/sinus: No tenderness to palpation and percussion. Facial movement is normal and symmetric. Eyes: PERRL, EOMI. No scleral icterus, conjunctivae clear. Neuro: CN II exam reveals vision grossly intact.  No nystagmus at any point of gaze. Ears: Auricles well formed without lesions.  Bilateral cerumen impaction.  Nose: External evaluation reveals normal support and skin without lesions.  Dorsum is intact.  Anterior rhinoscopy reveals congested mucosa over anterior aspect of inferior turbinates and intact septum.  No purulence noted. Oral:  Oral cavity and oropharynx are intact, symmetric, without erythema or edema.  Mucosa is moist without lesions. Neck: Full range of motion without pain.  There is no significant lymphadenopathy.  No masses palpable.  Thyroid  bed within normal limits to palpation.  Parotid glands and submandibular glands equal bilaterally without mass.  Trachea is midline. Neuro:  CN 2-12 grossly intact.     Procedure: Bilateral cerumen disimpaction Anesthesia: None Description: Under the operating microscope, the cerumen is carefully removed with a combination of cerumen currette, alligator forceps, and suction catheters.  After the  cerumen is removed, the TMs are noted to be normal.  No mass, erythema, or lesions. The patient tolerated the procedure well.    Assessment: 1.  Bilateral cerumen impaction.  After the disimpaction procedure, both tympanic membranes and middle ear spaces are noted to be normal. 2.  Subjectively stable bilateral sensorineural hearing loss. 3.  The patient's referred otalgia and TMJ disorder are currently under control.  Plan: 1.  Otomicroscopy with bilateral cerumen disimpaction. 2.  The physical exam findings are reviewed with the patient. 3.  The patient may use the Robaxin as needed to treat her referred otalgia. 4.  Continue the use of her hearing aids. 5.  The patient will return for reevaluation in 6 months.

## 2023-08-11 ENCOUNTER — Ambulatory Visit (INDEPENDENT_AMBULATORY_CARE_PROVIDER_SITE_OTHER): Payer: Medicare Other

## 2023-08-11 VITALS — Ht 65.0 in | Wt 169.0 lb

## 2023-08-11 DIAGNOSIS — Z0000 Encounter for general adult medical examination without abnormal findings: Secondary | ICD-10-CM | POA: Diagnosis not present

## 2023-08-11 NOTE — Patient Instructions (Addendum)
 Julie Boone , Thank you for taking time out of your busy schedule to complete your Annual Wellness Visit with me. I enjoyed our conversation and look forward to speaking with you again next year. I, as well as your care team,  appreciate your ongoing commitment to your health goals. Please review the following plan we discussed and let me know if I can assist you in the future. Your Game plan/ To Do List    Referrals: If you haven't heard from the office you've been referred to, please reach out to them at the phone provided.   Follow up Visits: Next Medicare AWV with our clinical staff: 08/16/24 @ 1P   Have you seen your provider in the last 6 months (3 months if uncontrolled diabetes)?  Next Office Visit with your provider: 01/09/24 @ 1:40p  Clinician Recommendations:  Aim for 30 minutes of exercise or brisk walking, 6-8 glasses of water, and 5 servings of fruits and vegetables each day.       This is a list of the screening recommended for you and due dates:  Health Maintenance  Topic Date Due   DTaP/Tdap/Td vaccine (1 - Tdap) Never done   Zoster (Shingles) Vaccine (1 of 2) Never done   Pneumonia Vaccine (1 of 1 - PCV) Never done   Mammogram  02/15/2018   COVID-19 Vaccine (4 - 2024-25 season) 11/01/2022   Flu Shot  10/01/2023   Medicare Annual Wellness Visit  08/10/2024   DEXA scan (bone density measurement)  Completed   HPV Vaccine  Aged Out   Meningitis B Vaccine  Aged Out    Advanced directives: (Copy Requested) Please bring a copy of your health care power of attorney and living will to the office to be added to your chart at your convenience. You can mail to [facility] [address] [place], [place] [address] or email to [email or web address] Advance Care Planning is important because it:  [x]  Makes sure you receive the medical care that is consistent with your values, goals, and preferences  [x]  It provides guidance to your family and loved ones and  reduces their decisional burden about whether or not they are making the right decisions based on your wishes.  Follow the link provided in your after visit summary or read over the paperwork we have mailed to you to help you started getting your Advance Directives in place. If you need assistance in completing these, please reach out to us  so that we can help you!  See attachments for Preventive Care and Fall Prevention Tips.

## 2023-08-11 NOTE — Progress Notes (Signed)
 Subjective:   Julie Boone is a 89 y.o. who presents for a Medicare Wellness preventive visit.  As a reminder, Annual Wellness Visits don't include a physical exam, and some assessments may be limited, especially if this visit is performed virtually. We may recommend an in-person follow-up visit with your provider if needed.  Visit Complete: Virtual I connected with  Julie Boone on 08/11/23 by a audio enabled telemedicine application and verified that I am speaking with the correct person using two identifiers.  Patient Location: Home  Provider Location: Home Office  I discussed the limitations of evaluation and management by telemedicine. The patient expressed understanding and agreed to proceed.  Vital Signs: Because this visit was a virtual/telehealth visit, some criteria may be missing or patient reported. Any vitals not documented were not able to be obtained and vitals that have been documented are patient reported.    Persons Participating in Visit: Patient.  AWV Questionnaire: No: Patient Medicare AWV questionnaire was not completed prior to this visit.  Cardiac Risk Factors include: advanced age (>85men, >27 women);hypertension     Objective:     Today's Vitals   08/11/23 1304  Weight: 169 lb (76.7 kg)  Height: 5' 5 (1.651 m)   Body mass index is 28.12 kg/m.     08/11/2023    1:12 PM 08/03/2022    3:56 PM 09/02/2021    6:07 AM 08/05/2021    3:25 PM 12/16/2020    5:58 AM 12/09/2020   10:55 AM 07/30/2020    3:40 PM  Advanced Directives  Does Patient Have a Medical Advance Directive? Yes Yes Yes Yes Yes Yes Yes  Type of Estate agent of [place];Living will Healthcare Power of [place];Living will Healthcare Power of [place];Out of facility DNR (pink MOST or yellow form);Living will Healthcare Power of [place];Living will Healthcare Power of Attorney Living will;Healthcare Power of [employer] Power of Attorney  Does  patient want to make changes to medical advance directive?   No - Patient declined      Copy of Healthcare Power of Attorney in Chart? No - copy requested No - copy requested No - copy requested No - copy requested   No - copy requested  Would patient like information on creating a medical advance directive?   No - Patient declined        Current Medications (verified) Outpatient Encounter Medications as of 08/11/2023  Medication Sig   apixaban  (ELIQUIS ) 5 MG TABS tablet Take 1 tablet (5 mg total) by mouth 2 (two) times daily. MUST SCHEDULE AN APPT WITH CARDIOLOGIST FOR FUTURE REFILLS.   atenolol  (TENORMIN ) 50 MG tablet Take 1.5 tablets (75 mg total) by mouth daily.   hydrOXYzine  (ATARAX ) 10 MG tablet Take 1-2 tablets (10-20 mg total) by mouth 3 (three) times daily as needed for itching (at night).   meclizine  (ANTIVERT ) 25 MG tablet 1/2 to 1  po up to every 8 hours as needed for vertigo  Fall precautions   omeprazole  (PRILOSEC) 40 MG capsule TAKE 1 CAPSULE BY MOUTH DAILY   ondansetron  (ZOFRAN -ODT) 4 MG disintegrating tablet Take 1 tablet (4 mg total) by mouth every 8 (eight) hours as needed for nausea or vomiting.   triamcinolone  cream (KENALOG ) 0.1 % Apply 1 Application topically 2 (two) times daily. As directed   UNABLE TO FIND Med Name: [place] leg cramps ho meo pathix med  Takes as needed for leg cramps says it works well   No facility-administered encounter medications  on file as of 08/11/2023.    Allergies (verified) Shellfish allergy and Morphine  and codeine   History: Past Medical History:  Diagnosis Date   Acute sinusitis with symptoms greater than 10 days 06/23/2013   Arthritis    Breast cancer (HCC)    mastectomy 3   neg ln    HOH (hard of hearing)    HTN (hypertension)    Wears glasses    Wears hearing aid    both ears   Past Surgical History:  Procedure Laterality Date   APPENDECTOMY  2006   BREAST BIOPSY Left 2012   COLONOSCOPY     HERNIA REPAIR     INSERTION  OF MESH N/A 09/05/2013   Procedure: INSERTION OF MESH;  Surgeon: Brandy Cal. Cornett, MD;  Location: [facility];  Service: General;  Laterality: N/A;   MASTECTOMY Left 2013   TONSILLECTOMY AND ADENOIDECTOMY     Childhood   UMBILICAL HERNIA REPAIR N/A 09/05/2013   Procedure: HERNIA REPAIR UMBILICAL ADULT WITH MESH;  Surgeon: Brandy Cal. Cornett, MD;  Location: [facility];  Service: General;  Laterality: N/A;   Family History  Problem Relation Age of Onset   Hypertension Mother    Stroke Mother    Heart failure Mother    Lung disease Sister        smoker   Colon cancer Father        dx in his late 52's   Esophageal cancer Neg Hx    Inflammatory bowel disease Neg Hx    Liver disease Neg Hx    Pancreatic cancer Neg Hx    Stomach cancer Neg Hx    Social History   Socioeconomic History   Marital status: Widowed    Spouse name: Not on file   Number of children: 1   Years of education: 12   Highest education level: High school graduate  Occupational History    Comment: retired  Tobacco Use   Smoking status: Never   Smokeless tobacco: Never  Vaping Use   Vaping status: Never Used  Substance and Sexual Activity   Alcohol use: Yes    Alcohol/week: 0.0 standard drinks of alcohol    Comment: 1 time a month. has a sip ever now and then    Drug use: No   Sexual activity: Not on file  Other Topics Concern   Not on file  Social History Narrative   2 people living in the home   Dog [place] and [place]   From [place] moved from [place]   Adopted daughter nearby.      Neg ets  Wine with meals  No tob rd.   G0P0   Married husband [place] background  H[place]elf [place] descent.   Husband passed CHF and prostate cancer 2017   Now living with daughter         Social Drivers of Health   Financial Resource Strain: Low Risk  (08/11/2023)   Overall Financial Resource Strain (CARDIA)    Difficulty of Paying Living Expenses: Not hard at all  Food Insecurity: No Food  Insecurity (08/11/2023)   Hunger Vital Sign    Worried About Running Out of Food in the Last Year: Never true    Ran Out of Food in the Last Year: Never true  Transportation Needs: No Transportation Needs (08/11/2023)   PRAPARE - Administrator, Civil Service (Medical): No    Lack of Transportation (Non-Medical): No  Physical Activity: Inactive (08/11/2023)  Exercise Vital Sign    Days of Exercise per Week: 0 days    Minutes of Exercise per Session: 0 min  Stress: No Stress Concern Present (08/11/2023)   [employer] of Occupational Health - Occupational Stress Questionnaire    Feeling of Stress : Not at all  Social Connections: Moderately Integrated (08/11/2023)   Social Connection and Isolation Panel [NHANES]    Frequency of Communication with Friends and Family: More than three times a week    Frequency of Social Gatherings with Friends and Family: More than three times a week    Attends Religious Services: More than 4 times per year    Active Member of [employer] or Organizations: Yes    Attends Banker Meetings: More than 4 times per year    Marital Status: Widowed    Tobacco Counseling Counseling given: Not Answered    Clinical Intake:  Pre-visit preparation completed: Yes  Pain : No/denies pain     BMI - recorded: 28.12 Nutritional Status: BMI 25 -29 Overweight Nutritional Risks: None Diabetes: No  Lab Results  Component Value Date   HGBA1C 6.8 (H) 06/10/2023   HGBA1C 6.2 06/02/2022   HGBA1C 6.2 05/21/2020     How often do you need to have someone help you when you read instructions, pamphlets, or other written materials from your doctor or pharmacy?: 1 - Never  Interpreter Needed?: No  Information entered by :: Farris Hong LPN   Activities of Daily Living     08/11/2023    1:11 PM  In your present state of health, do you have any difficulty performing the following activities:  Hearing? 0  Vision? 0  Difficulty  concentrating or making decisions? 0  Walking or climbing stairs? 1  Comment Uses Cane and Walker  Dressing or bathing? 0  Doing errands, shopping? 0  Preparing Food and eating ? N  Using the Toilet? N  In the past six months, have you accidently leaked urine? N  Do you have problems with loss of bowel control? N  Managing your Medications? N  Managing your Finances? N  Housekeeping or managing your Housekeeping? N    Patient Care Team: Panosh, Joaquim Muir, MD as PCP - General (Internal Medicine) Euell Herrlich, MD as PCP - Cardiology (Cardiology) Sim Dryer, MD as Consulting Physician (General Surgery) Mignon Alberts, DPM as Consulting Physician (Podiatry) Alver Austin, [facility] (Inactive) as Pharmacist (Pharmacist)  I have updated your Care Teams any recent Medical Services you may have received from other providers in the past year.     Assessment:    This is a routine wellness examination for [place].  Hearing/Vision screen Hearing Screening - Comments:: Denies hearing difficulties   Vision Screening - Comments:: Wears rx glasses - up to date with routine eye exams with  Dr Harvis Ling   Goals Addressed               This Visit's Progress     Stay alive (pt-stated)        Remain active       Depression Screen     08/11/2023    1:09 PM 08/03/2022    3:51 PM 09/03/2021    3:48 PM 08/05/2021    3:26 PM 07/21/2021   11:50 AM 07/30/2020    3:33 PM 05/21/2020   10:04 AM  PHQ 2/9 Scores  PHQ - 2 Score 0 0 0 0 0 0 0  PHQ- 9 Score   1  Fall Risk     08/11/2023    1:12 PM 08/03/2022    3:55 PM 09/03/2021    3:48 PM 08/05/2021    3:26 PM 07/21/2021   11:50 AM  Fall Risk   Falls in the past year? 0 0 0 0 0  Number falls in past yr: 0 0 0 0 0  Injury with Fall? 0 0 0 0 0  Risk for fall due to : No Fall Risks No Fall Risks No Fall Risks Medication side effect No Fall Risks  Follow up Falls evaluation completed Falls prevention discussed Falls prevention discussed  Falls evaluation completed;Education provided;Falls prevention discussed     MEDICARE RISK AT HOME:  Medicare Risk at Home Any stairs in or around the home?: No If so, are there any without handrails?: No Home free of loose throw rugs in walkways, pet beds, electrical cords, etc?: Yes Adequate lighting in your home to reduce risk of falls?: Yes Life alert?: Yes Use of a cane, walker or w/c?: Yes Grab bars in the bathroom?: Yes Shower chair or bench in shower?: Yes Elevated toilet seat or a handicapped toilet?: Yes  TIMED UP AND GO:  Was the test performed?  No  Cognitive Function: 6CIT completed        08/11/2023    1:13 PM 08/03/2022    3:57 PM 08/05/2021    3:29 PM 07/30/2020    3:49 PM 02/27/2019    2:21 PM  6CIT Screen  What Year? 0 points 0 points 0 points 0 points 0 points  What month? 0 points 0 points 0 points 0 points 0 points  What time? 0 points 0 points 0 points 0 points 0 points  Count back from 20 0 points 0 points 0 points 0 points 0 points  Months in reverse 0 points 0 points 0 points 0 points 0 points  Repeat phrase 0 points 0 points 10 points 0 points 0 points  Total Score 0 points 0 points 10 points 0 points 0 points    Immunizations Immunization History  Administered Date(s) Administered   PFIZER(Purple Top)SARS-COV-2 Vaccination 03/22/2019, 04/10/2019, 02/21/2020    Screening Tests Health Maintenance  Topic Date Due   DTaP/Tdap/Td (1 - Tdap) Never done   Zoster Vaccines- Shingrix (1 of 2) Never done   Pneumonia Vaccine 19+ Years old (1 of 1 - PCV) Never done   MAMMOGRAM  02/15/2018   COVID-19 Vaccine (4 - 2024-25 season) 11/01/2022   INFLUENZA VACCINE  10/01/2023   Medicare Annual Wellness (AWV)  08/10/2024   DEXA SCAN  Completed   HPV VACCINES  Aged Out   Meningococcal B Vaccine  Aged Out    Health Maintenance  Health Maintenance Due  Topic Date Due   DTaP/Tdap/Td (1 - Tdap) Never done   Zoster Vaccines- Shingrix (1 of 2) Never done    Pneumonia Vaccine 43+ Years old (1 of 1 - PCV) Never done   MAMMOGRAM  02/15/2018   COVID-19 Vaccine (4 - 2024-25 season) 11/01/2022   Health Maintenance Items Addressed:   Additional Screening:  Vision Screening: Recommended annual ophthalmology exams for early detection of glaucoma and other disorders of the eye. Would you like a referral to an eye doctor? No    Dental Screening: Recommended annual dental exams for proper oral hygiene  Community Resource Referral / Chronic Care Management: CRR required this visit?  No   CCM required this visit?  No   Plan:    I have personally  reviewed and noted the following in the patient's chart:   Medical and social history Use of alcohol, tobacco or illicit drugs  Current medications and supplements including opioid prescriptions. Patient is not currently taking opioid prescriptions. Functional ability and status Nutritional status Physical activity Advanced directives List of other physicians Hospitalizations, surgeries, and ER visits in previous 12 months Vitals Screenings to include cognitive, depression, and falls Referrals and appointments  In addition, I have reviewed and discussed with patient certain preventive protocols, quality metrics, and best practice recommendations. A written personalized care plan for preventive services as well as general preventive health recommendations were provided to patient.   Dewayne Ford, LPN   1/61/0960   After Visit Summary: (MyChart) Due to this being a telephonic visit, the after visit summary with patients personalized plan was offered to patient via MyChart   Notes: Nothing significant to report at this time.

## 2023-08-13 ENCOUNTER — Telehealth: Payer: Self-pay | Admitting: Internal Medicine

## 2023-08-13 ENCOUNTER — Other Ambulatory Visit: Payer: Self-pay

## 2023-08-13 DIAGNOSIS — I480 Paroxysmal atrial fibrillation: Secondary | ICD-10-CM

## 2023-08-13 MED ORDER — APIXABAN 5 MG PO TABS: 5.0000 mg | ORAL_TABLET | Freq: Two times a day (BID) | ORAL | 1 refills | Status: DC

## 2023-08-13 NOTE — Telephone Encounter (Signed)
*  STAT* If patient is at the pharmacy, call can be transferred to refill team.   1. Which medications need to be refilled? (please list name of each medication and dose if known)   apixaban  (ELIQUIS ) 5 MG TABS tablet   2. Would you like to learn more about the convenience, safety, & potential cost savings by using the [facility]?   3. Are you open to using the [facility] (Type [facility]. ).  4. Which pharmacy/location (including street and city if local pharmacy) is medication to be sent to?  [facility] [id number] - [place], [place] - [address]   5. Do they need a 30 day or 90 day supply?     Daughter Felipa Horsfall) stated patient is almost out of this medication.  Patient has appointment scheduled with A. Duke, PA on 7/25.

## 2023-09-23 NOTE — Progress Notes (Unsigned)
 Cardiology Office Note:    Date:  09/24/2023   ID:  Julie Boone, DOB 20-Nov-1924, MRN [id number]  PCP:  Charlett Apolinar POUR, MD   [facility] Providers Cardiologist:  Soyla DELENA Merck, MD Cardiology APP:  Julie Julie Garre, PA     Referring MD: Charlett Apolinar POUR, MD   Chief Complaint  Patient presents with   Follow-up    PAF, eliquis     History of Present Illness:    Julie Boone is a 89 y.o. female with a hx of PAF, HTN, unprovoked PE, chronic anticoagulation, and GERD.  ER visit in 2017 for near syncope felt related to Afib with RVR, spontaneously converted in the ER. She had been on atenolol  for 20 years at that point, no change in dose was made.  Echo in 2020 demonstrated preserved LVEF of 60-65%, grade 2 DD, normal RV, and no significant valvular disease.   She is from the [place], worked as a Diplomatic Services operational officer. She recounts her daughter's adoption and her faith in God. 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    Past Medical History:  Diagnosis Date   Acute sinusitis with symptoms greater than 10 days 06/23/2013   Arthritis    Breast cancer (HCC)    mastectomy 3   neg ln    HOH (hard of hearing)    HTN (hypertension)    Wears glasses    Wears hearing aid    both ears    Past Surgical History:  Procedure Laterality Date   APPENDECTOMY  2006   BREAST BIOPSY Left 2012   COLONOSCOPY     HERNIA REPAIR     INSERTION OF MESH N/A 09/05/2013   Procedure: INSERTION OF MESH;  Surgeon: Debby DELENA. Cornett, MD;  Location: [facility];  Service: General;  Laterality: N/A;   MASTECTOMY Left 2013   TONSILLECTOMY AND ADENOIDECTOMY     Childhood   UMBILICAL HERNIA REPAIR N/A 09/05/2013   Procedure: HERNIA REPAIR UMBILICAL ADULT WITH MESH;  Surgeon: Debby DELENA. Cornett, MD;  Location: [facility];  Service: General;  Laterality: N/A;    Current Medications: Current Meds  Medication Sig   atenolol  (TENORMIN ) 25 MG tablet Take 1 tablet (25 mg  total) by mouth 2 (two) times daily.   atenolol  (TENORMIN ) 50 MG tablet Take 1.5 tablets (75 mg total) by mouth daily.   hydrOXYzine  (ATARAX ) 10 MG tablet Take 1-2 tablets (10-20 mg total) by mouth 3 (three) times daily as needed for itching (at night).   meclizine  (ANTIVERT ) 25 MG tablet 1/2 to 1  po up to every 8 hours as needed for vertigo  Fall precautions   omeprazole  (PRILOSEC) 40 MG capsule TAKE 1 CAPSULE BY MOUTH DAILY   ondansetron  (ZOFRAN -ODT) 4 MG disintegrating tablet Take 1 tablet (4 mg total) by mouth every 8 (eight) hours as needed for nausea or vomiting.   triamcinolone  cream (KENALOG ) 0.1 % Apply 1 Application topically 2 (two) times daily. As directed   UNABLE TO FIND Med Name: [place] leg cramps ho meo pathix med  Takes as needed for leg cramps says it works well   [DISCONTINUED] apixaban  (ELIQUIS ) 5 MG TABS tablet Take 1 tablet (5 mg total) by mouth 2 (two) times daily.     Allergies:   Shellfish allergy and Morphine  and codeine   Social History   Socioeconomic History   Marital status: Widowed    Spouse name: Not on file   Number of children: 1  Years of education: 61   Highest education level: High school graduate  Occupational History    Comment: retired  Tobacco Use   Smoking status: Never   Smokeless tobacco: Never  Vaping Use   Vaping status: Never Used  Substance and Sexual Activity   Alcohol use: Yes    Alcohol/week: 0.0 standard drinks of alcohol    Comment: 1 time a month. has a sip ever now and then    Drug use: No   Sexual activity: Not on file  Other Topics Concern   Not on file  Social History Narrative   2 people living in the home   Dog Small y[place] and [place]   From [place] moved from [place]   Adopted daughter nearby.      Neg ets  Wine with meals  No tob rd.   G0P0   Married husband [place] background  [place]elf [place] descent.   Husband passed CHF and prostate cancer 2017   Now living with daughter         Social Drivers of Health    Financial Resource Strain: Low Risk  (08/11/2023)   Overall Financial Resource Strain (CARDIA)    Difficulty of Paying Living Expenses: Not hard at all  Food Insecurity: No Food Insecurity (08/11/2023)   Hunger Vital Sign    Worried About Running Out of Food in the Last Year: Never true    Ran Out of Food in the Last Year: Never true  Transportation Needs: No Transportation Needs (08/11/2023)   PRAPARE - Administrator, Civil Service (Medical): No    Lack of Transportation (Non-Medical): No  Physical Activity: Inactive (08/11/2023)   Exercise Vital Sign    Days of Exercise per Week: 0 days    Minutes of Exercise per Session: 0 min  Stress: No Stress Concern Present (08/11/2023)   [employer] of Occupational Health - Occupational Stress Questionnaire    Feeling of Stress : Not at all  Social Connections: Moderately Integrated (08/11/2023)   Social Connection and Isolation Panel    Frequency of Communication with Friends and Family: More than three times a week    Frequency of Social Gatherings with Friends and Family: More than three times a week    Attends Religious Services: More than 4 times per year    Active Member of [employer] or Organizations: Yes    Attends Banker Meetings: More than 4 times per year    Marital Status: Widowed     Family History: The patient's family history includes Colon cancer in her father; Heart failure in her mother; Hypertension in her mother; Lung disease in her sister; Stroke in her mother. There is no history of Esophageal cancer, Inflammatory bowel disease, Liver disease, Pancreatic cancer, or Stomach cancer.  ROS:   Please see the history of present illness.     All other systems reviewed and are negative.  EKGs/Labs/Other Studies Reviewed:    The following studies were reviewed today:  EKG Interpretation Date/Time:  Friday September 24 2023 16:10:04 EDT Ventricular Rate:  73 PR Interval:  196 QRS Duration:  96 QT  Interval:  396 QTC Calculation: 436 R Axis:   -45  Text Interpretation: Normal sinus rhythm Pulmonary disease pattern Left anterior fascicular block When compared with ECG of 16-Dec-2020 06:37, PREVIOUS ECG IS PRESENT Confirmed by Julie Boone ([id number]) on 09/24/2023 4:14:40 PM    Recent Labs: 06/10/2023: ALT 18; BUN 20; Creatinine, Ser 0.89; Hemoglobin 16.0; Platelets 302.0; Potassium 4.3;  Sodium 137; TSH 2.37  Recent Lipid Panel    Component Value Date/Time   CHOL 187 05/21/2020 1055   TRIG 176.0 (H) 05/21/2020 1055   HDL 35.30 (L) 05/21/2020 1055   CHOLHDL 5 05/21/2020 1055   VLDL 35.2 05/21/2020 1055   LDLCALC 117 (H) 05/21/2020 1055   LDLDIRECT 103.0 07/10/2019 1537     Risk Assessment/Calculations:    CHA2DS2-VASc Score = 4   This indicates a 4.8% annual risk of stroke. The patient's score is based upon: CHF History: 0 HTN History: 1 Diabetes History: 0 Stroke History: 0 Vascular Disease History: 0 Age Score: 2 Gender Score: 1   Physical Exam:    VS:  BP (!) 150/96   Pulse 73   Ht 5' 4 (1.626 m)   Wt 172 lb (78 kg)   SpO2 92%   BMI 29.52 kg/m     Wt Readings from Last 3 Encounters:  09/24/23 172 lb (78 kg)  08/11/23 169 lb (76.7 kg)  06/10/23 169 lb 9.6 oz (76.9 kg)     GEN: elderly female in NAD HEENT: Normal NECK: No JVD; No carotid bruits LYMPHATICS: No lymphadenopathy CARDIAC: RRR, no murmurs, rubs, gallops RESPIRATORY:  Clear to auscultation without rales, wheezing or rhonchi  ABDOMEN: Soft, non-tender, non-distended MUSCULOSKELETAL:  mild B LE edema SKIN: Warm and dry NEUROLOGIC:  Alert and oriented x 3 PSYCHIATRIC:  Normal affect   ASSESSMENT:    1. Essential hypertension   2. PAF (paroxysmal atrial fibrillation) (HCC)   3. Chronic anticoagulation   4. History of pulmonary embolism   5. Paroxysmal atrial fibrillation (HCC)    PLAN:    In order of problems listed above:  PAF - continue atenolol  - will need to watch this with renal  function/clearance - recent sCr 0.89 - she feels stable and prefers to make no changes - she is taking 50 mg atenolol  --> tried to 75 mg atenolol  but went back to 50 mg - she is agreeable to reduce atenolol  to 25 mg   Chronic anticoagulation - appropriately dosed with 5 mg eliquis  BID - repeat CBC in April stable   Hx of PE - unprovoked - continue eliquis    HTN - maintained on 50 mg atenolol  --> reduce to 25 mg atenolol  - no longer on lisinopril  - BP elevated today, but she runs normal at home, has been nervous about today's visit   Follow up in April with me or Dr. Acharya.      Medication Adjustments/Labs and Tests Ordered: Current medicines are reviewed at length with the patient today.  Concerns regarding medicines are outlined above.  Orders Placed This Encounter  Procedures   EKG 12-Lead   Meds ordered this encounter  Medications   apixaban  (ELIQUIS ) 5 MG TABS tablet    Sig: Take 1 tablet (5 mg total) by mouth 2 (two) times daily.    Dispense:  180 tablet    Refill:  3    30 day supply   atenolol  (TENORMIN ) 25 MG tablet    Sig: Take 1 tablet (25 mg total) by mouth 2 (two) times daily.    Dispense:  180 tablet    Refill:  3    Patient Instructions  Medication Instructions:  Discontinue Lisinopril .  Your refill for Eliquis  5mg . Take one tablet twice daily,  Take Atenolol  25mg . Take one tablet daily.    *If you need a refill on your cardiac medications before your next appointment, please call your pharmacy*  Lab Work: No labs were ordered during today's visit.  If you have labs (blood work) drawn today and your tests are completely normal, you will receive your results only by: MyChart Message (if you have MyChart) OR A paper copy in the mail If you have any lab test that is abnormal or we need to change your treatment, we will call you to review the results.   Testing/Procedures: No procedures were ordered during today's visit.     Follow-Up: At [facility], you and your health needs are our priority.  As part of our continuing mission to provide you with exceptional heart care, we have created designated Provider Care Teams.  These Care Teams include your primary Cardiologist (physician) and Advanced Practice Providers (APPs -  Physician Assistants and Nurse Practitioners) who all work together to provide you with the care you need, when you need it.  We recommend signing up for the patient portal called MyChart.  Sign up information is provided on this After Visit Summary.  MyChart is used to connect with patients for Virtual Visits (Telemedicine).  Patients are able to view lab/test results, encounter notes, upcoming appointments, etc.  Non-urgent messages can be sent to your provider as well.   To learn more about what you can do with MyChart, go to [email or web address].    Your next appointment:   9 month(s)  Provider:   Gayatri A Acharya, MD or Julie Boone    Other Instructions Thank you for choosing [facility]!       Signed, Julie Boone, [place]  09/24/2023 4:46 PM    [facility]

## 2023-09-24 ENCOUNTER — Ambulatory Visit: Attending: Physician Assistant | Admitting: Physician Assistant

## 2023-09-24 ENCOUNTER — Encounter: Payer: Self-pay | Admitting: Physician Assistant

## 2023-09-24 VITALS — BP 150/96 | HR 73 | Ht 64.0 in | Wt 172.0 lb

## 2023-09-24 DIAGNOSIS — Z7901 Long term (current) use of anticoagulants: Secondary | ICD-10-CM

## 2023-09-24 DIAGNOSIS — Z86711 Personal history of pulmonary embolism: Secondary | ICD-10-CM

## 2023-09-24 DIAGNOSIS — I480 Paroxysmal atrial fibrillation: Secondary | ICD-10-CM

## 2023-09-24 DIAGNOSIS — I10 Essential (primary) hypertension: Secondary | ICD-10-CM | POA: Diagnosis not present

## 2023-09-24 MED ORDER — ATENOLOL 25 MG PO TABS: 25.0000 mg | ORAL_TABLET | Freq: Two times a day (BID) | ORAL | 3 refills | Status: AC

## 2023-09-24 MED ORDER — APIXABAN 5 MG PO TABS: 5.0000 mg | ORAL_TABLET | Freq: Two times a day (BID) | ORAL | 3 refills | Status: DC

## 2023-09-24 NOTE — Patient Instructions (Addendum)
 Medication Instructions:  Discontinue Lisinopril .  Your refill for Eliquis  5mg . Take one tablet twice daily,  Take Atenolol  25mg . Take one tablet daily.    *If you need a refill on your cardiac medications before your next appointment, please call your pharmacy*   Lab Work: No labs were ordered during today's visit.  If you have labs (blood work) drawn today and your tests are completely normal, you will receive your results only by: MyChart Message (if you have MyChart) OR A paper copy in the mail If you have any lab test that is abnormal or we need to change your treatment, we will call you to review the results.   Testing/Procedures: No procedures were ordered during today's visit.    Follow-Up: At [facility], you and your health needs are our priority.  As part of our continuing mission to provide you with exceptional heart care, we have created designated Provider Care Teams.  These Care Teams include your primary Cardiologist (physician) and Advanced Practice Providers (APPs -  Physician Assistants and Nurse Practitioners) who all work together to provide you with the care you need, when you need it.  We recommend signing up for the patient portal called MyChart.  Sign up information is provided on this After Visit Summary.  MyChart is used to connect with patients for Virtual Visits (Telemedicine).  Patients are able to view lab/test results, encounter notes, upcoming appointments, etc.  Non-urgent messages can be sent to your provider as well.   To learn more about what you can do with MyChart, go to [email or web address].    Your next appointment:   9 month(s)  Provider:   Gayatri A Acharya, MD or [facility]    Other Instructions Thank you for choosing [facility]!

## 2023-12-25 ENCOUNTER — Other Ambulatory Visit: Payer: Self-pay | Admitting: Internal Medicine

## 2023-12-25 DIAGNOSIS — I480 Paroxysmal atrial fibrillation: Secondary | ICD-10-CM

## 2023-12-28 ENCOUNTER — Other Ambulatory Visit: Payer: Self-pay

## 2023-12-28 DIAGNOSIS — I480 Paroxysmal atrial fibrillation: Secondary | ICD-10-CM

## 2023-12-28 MED ORDER — APIXABAN 5 MG PO TABS: 5.0000 mg | ORAL_TABLET | Freq: Two times a day (BID) | ORAL | 3 refills | Status: AC

## 2023-12-28 NOTE — Telephone Encounter (Signed)
 Prescription refill request for Eliquis  received. Indication:afib Last office visit:7/25 Scr:0.89  4/25 Age: 89 Weight:78  kg  Prescription refilled

## 2023-12-29 ENCOUNTER — Encounter (INDEPENDENT_AMBULATORY_CARE_PROVIDER_SITE_OTHER): Payer: Self-pay | Admitting: Otolaryngology

## 2023-12-29 ENCOUNTER — Ambulatory Visit (INDEPENDENT_AMBULATORY_CARE_PROVIDER_SITE_OTHER): Admitting: Otolaryngology

## 2023-12-29 VITALS — HR 79 | Temp 98.0°F | Ht 64.0 in | Wt 168.0 lb

## 2023-12-29 DIAGNOSIS — H6123 Impacted cerumen, bilateral: Secondary | ICD-10-CM | POA: Diagnosis not present

## 2023-12-29 NOTE — Progress Notes (Signed)
 Patient ID: Julie Boone, female   DOB: [birth year], 89 y.o.   MRN: [id number]  Procedure: Bilateral cerumen disimpaction.   Indication: Cerumen impaction, resulting in ear discomfort and conductive hearing loss.   Description: The patient is placed supine on the operating table. Under the operating microscope, the right ear canal is examined and is noted to be impacted with cerumen. The cerumen is carefully removed with a combination of suction catheters, cerumen curette, and alligator forceps. After the cerumen removal, the ear canal and tympanic membrane are noted to be normal. No middle ear effusion is noted. The same procedure is then repeated on the left side without exception. The patient tolerated the procedure well.  Follow-up care:  The patient is instructed not to use Q-tips to clean the ear canals. The patient will follow up in 4 months.

## 2023-12-30 ENCOUNTER — Telehealth: Payer: Self-pay

## 2023-12-30 DIAGNOSIS — L308 Other specified dermatitis: Secondary | ICD-10-CM

## 2023-12-30 NOTE — Telephone Encounter (Signed)
 Copied from CRM [id number]. Topic: Referral - Request for Referral >> Dec 27, 2023 11:54 AM Roselie BROCKS wrote: Did the patient discuss referral with their provider in the last year? Yes (If No - schedule appointment) (If Yes - send message)  Appointment offered? No  Type of order/referral and detailed reason for visit: Dermatologist  Preference of office, provider, location: Does not have a doctor in mind   If referral order, have you been seen by this specialty before? No (If Yes, this issue or another issue? When? Where?  Can we respond through MyChart? Yes

## 2024-01-04 NOTE — Telephone Encounter (Signed)
 Do referral if needed

## 2024-01-19 ENCOUNTER — Ambulatory Visit (INDEPENDENT_AMBULATORY_CARE_PROVIDER_SITE_OTHER): Admitting: Otolaryngology

## 2024-02-03 NOTE — Telephone Encounter (Signed)
 Patients daughter called in to check on the status of this referral. She stated that the rash that her mom has has gotten so much worse to the point where she's not able to sleep from the itching. She would like to know if referral could be placed as soon as possible and marked urgent?

## 2024-02-04 NOTE — Addendum Note (Signed)
 Addended by: Kesley Mullens on: 02/04/2024 08:33 AM   Modules accepted: Orders

## 2024-02-04 NOTE — Telephone Encounter (Signed)
 Contacted pt's daughter and inform them the referral is placed to Dr. Ivin office. Someone will call her sometimes next week. If after 10 days, to reach out to us . Daughter verbalized understanding.

## 2024-02-08 ENCOUNTER — Other Ambulatory Visit: Payer: Self-pay

## 2024-02-08 ENCOUNTER — Emergency Department (HOSPITAL_BASED_OUTPATIENT_CLINIC_OR_DEPARTMENT_OTHER)
Admission: EM | Admit: 2024-02-08 | Discharge: 2024-02-08 | Disposition: A | Discharge status: Home / Self Care | Attending: Emergency Medicine | Admitting: Emergency Medicine

## 2024-02-08 DIAGNOSIS — R21 Rash and other nonspecific skin eruption: Secondary | ICD-10-CM

## 2024-02-08 MED ORDER — PREDNISONE 10 MG (21) PO TBPK: ORAL_TABLET | Freq: Every day | ORAL | 0 refills | Status: AC

## 2024-02-08 MED ORDER — METHYLPREDNISOLONE SODIUM SUCC 125 MG IJ SOLR
125.0000 mg | Freq: Once | INTRAMUSCULAR | Status: AC
  Administered 2024-02-08: 125 mg via INTRAMUSCULAR
  Filled 2024-02-08: qty 2

## 2024-02-08 NOTE — Discharge Instructions (Addendum)
 Today you were seen for a rash.  You have been prescribed a prednisone  taper to help with symptoms.  You have also been given the contact information for local dermatology clinics for further evaluation and workup.  Thank you for letting us  treat you today. After reviewing your labs and imaging, I feel you are safe to go home. Please follow up with your PCP in the next several days and provide them with your records from this visit. Return to the Emergency Room if pain becomes severe or symptoms worsen.

## 2024-02-08 NOTE — ED Triage Notes (Signed)
 Reports itchy rash x 3 months. Rash on bilateral arms and legs. NAD.

## 2024-02-08 NOTE — ED Provider Notes (Signed)
 [place] EMERGENCY DEPARTMENT AT [facility] Provider Note   CSN: [id number] Arrival date & time: 02/08/24  1338     Patient presents with: Rash   Julie Boone is a 89 y.o. female.  Presents today for a pruritic rash x 3 months.  Patient reports the rash is on bilateral arms and legs.  Patient has tried topical calamine lotion, cortisone, and food elimination diet.  Patient has attempted to get in with dermatology but is not been able to schedule an appointment yet.  Patient denies difficulty breathing, shortness of breath, chest pain, fever, chills, nausea, vomiting, cough, congestion, any other complaints at this time.  Patient denies any recent changes in medications, detergents, or lotions.    Rash      Prior to Admission medications   Medication Sig Start Date End Date Taking? Authorizing Provider  predniSONE  (STERAPRED UNI-PAK 21 TAB) 10 MG (21) TBPK tablet Take by mouth daily. Take 6 tabs by mouth daily  for 2 days, then 5 tabs for 2 days, then 4 tabs for 2 days, then 3 tabs for 2 days, 2 tabs for 2 days, then 1 tab by mouth daily for 2 days 02/08/24  Yes Francis Ileana SAILOR, PA-C  apixaban  (ELIQUIS ) 5 MG TABS tablet Take 1 tablet (5 mg total) by mouth 2 (two) times daily. 12/28/23   Acharya, Gayatri A, MD  atenolol  (TENORMIN ) 25 MG tablet Take 1 tablet (25 mg total) by mouth 2 (two) times daily. 09/24/23 12/23/23  Madie Jon Garre, PA  atenolol  (TENORMIN ) 50 MG tablet Take 1.5 tablets (75 mg total) by mouth daily. 04/30/23   Panosh, Apolinar POUR, MD  hydrOXYzine  (ATARAX ) 10 MG tablet Take 1-2 tablets (10-20 mg total) by mouth 3 (three) times daily as needed for itching (at night). 06/10/23   Panosh, Apolinar POUR, MD  meclizine  (ANTIVERT ) 25 MG tablet 1/2 to 1  po up to every 8 hours as needed for vertigo  Fall precautions 10/05/22   Webb, Padonda B, FNP  omeprazole  (PRILOSEC) 40 MG capsule TAKE 1 CAPSULE BY MOUTH DAILY 02/02/23   Webb, Padonda B, FNP  ondansetron  (ZOFRAN -ODT) 4  MG disintegrating tablet Take 1 tablet (4 mg total) by mouth every 8 (eight) hours as needed for nausea or vomiting. 08/31/21   Phebe Fonda RAMAN, MD  triamcinolone  cream (KENALOG ) 0.1 % Apply 1 Application topically 2 (two) times daily. As directed 06/10/23   Panosh, Apolinar POUR, MD  UNABLE TO FIND Med Name: [facility] leg cramps ho meo pathix med  Takes as needed for leg cramps says it works well    [provider]    Allergies: Shellfish allergy and Morphine  and codeine    Review of Systems  Skin:  Positive for rash.    Updated Vital Signs BP (!) 188/75 (BP Location: Right Arm)   Pulse 78   Temp 98.5 F (36.9 C) (Oral)   Resp (!) 21   SpO2 98%   Physical Exam Vitals and nursing note reviewed.  Constitutional:      General: She is not in acute distress.    Appearance: She is well-developed. She is not toxic-appearing.  HENT:     Head: Normocephalic and atraumatic.     Right Ear: External ear normal.     Left Ear: External ear normal.     Nose: Nose normal.     Mouth/Throat:     Mouth: Mucous membranes are moist.     Pharynx: Oropharynx is clear.  Eyes:  Conjunctiva/sclera: Conjunctivae normal.  Cardiovascular:     Rate and Rhythm: Normal rate and regular rhythm.     Pulses: Normal pulses.     Heart sounds: Normal heart sounds. No murmur heard. Pulmonary:     Effort: Pulmonary effort is normal. No respiratory distress.     Breath sounds: Normal breath sounds.  Abdominal:     Palpations: Abdomen is soft.     Tenderness: There is no abdominal tenderness.  Musculoskeletal:        General: No swelling.     Cervical back: Neck supple.  Skin:    General: Skin is warm and dry.     Capillary Refill: Capillary refill takes less than 2 seconds.     Findings: Rash present.     Comments: Patient with diffuse erythematous macular rash on bilateral upper extremities and more mildly on lower extremities and torso.  Excoriations noted as well.  No vesicles or crusts noted on  exam.  Neurological:     General: No focal deficit present.     Mental Status: She is alert and oriented to person, place, and time.  Psychiatric:        Mood and Affect: Mood normal.     (all labs ordered are listed, but only abnormal results are displayed) Labs Reviewed - No data to display  EKG: None  Radiology: No results found.   Procedures   Medications Ordered in the ED  methylPREDNISolone  sodium succinate (SOLU-MEDROL ) 125 mg/2 mL injection 125 mg (has no administration in time range)                                    Medical Decision Making  This patient presents to the ED for concern of rash x 3 months differential diagnosis includes poison ivy, shingles, dermatitis, SJS, TENS    Additional history obtained   Additional history obtained from Electronic Medical Record External records from outside source obtained and reviewed including family medicine notes   Medicines ordered and prescription drug management:  I ordered medication including Solu-Medrol     I have reviewed the patients home medicines and have made adjustments as needed   Problem List / ED Course:  Considered for admission or further workup however patient's vital signs and physical exam are reassuring.  Patient has no signs or symptoms concerning for shingles, SJS, or TENS.  Patient given prednisone  taper and advised to follow-up with dermatology as soon as possible.  Patient given return precautions.  I feel patient safe for discharge at this time.      Final diagnoses:  Rash    ED Discharge Orders          Ordered    predniSONE  (STERAPRED UNI-PAK 21 TAB) 10 MG (21) TBPK tablet  Daily        02/08/24 1455               Francis Ileana SAILOR, PA-C 02/08/24 1455

## 2024-02-08 NOTE — ED Notes (Signed)
 Reviewed discharge instructions, medications, and home care with pt. Pt verbalized understanding and had no further questions. Pt exited ED without complications.

## 2024-03-07 ENCOUNTER — Telehealth: Payer: Self-pay

## 2024-03-07 ENCOUNTER — Ambulatory Visit: Payer: Self-pay

## 2024-03-07 NOTE — Telephone Encounter (Signed)
 Chronic rash ongoing, worse in the last 2 days after discontinuing prednisone  from ED. Has derm appt next month but seeking relief in the mean time.   FYI Only or Action Required?: FYI only for provider: appointment scheduled on 03/08/24.  Patient was last seen in primary care on 06/10/2023 by Panosh, Apolinar POUR, MD.  Called Nurse Triage reporting Rash.  Symptoms began several years ago.  Interventions attempted: OTC medications: hydrocortisone cream and Rest, hydration, or home remedies.  Symptoms are: gradually worsening.  Triage Disposition: See Physician Within 24 Hours  Patient/caregiver understands and will follow disposition?: Yes  Copied from CRM #[id number]. Topic: Clinical - Red Word Triage >> Mar 07, 2024 11:47 AM [name] wrote: Red Word that prompted transfer to Nurse Triage: Patient is in excruiating pain and itching with this condition. Patient is experiencing nausea, face redness, weakness, and sometimes shortness of breath. Reason for Disposition  SEVERE itching (i.e., interferes with sleep, normal activities or school)  Answer Assessment - Initial Assessment Questions 1. APPEARANCE of RASH: What does the rash look like? (e.g., blisters, dry flaky skin, red spots, redness, sores)     Red and blotchy  3. LOCATION: Where is the rash located?     Generalized, from feet to neck   5. ONSET: When did the rash begin?     2-3 years on and off. Was seen in ED last month for this. Worsened within the last 2 days after discontinuing prednisone .   6. FEVER: Do you have a fever? If Yes, ask: What is your temperature, how was it measured, and when did it start?     Denies  7. ITCHING: Does the rash itch? If Yes, ask: How bad is the itch? (Scale 1-10; or mild, moderate, severe)     Severe itching, keeping the pt up at night  8. CAUSE: What do you think is causing the rash?     Pt is unsure.   9. MEDICINE FACTORS: Have you started any new medicines within the last  2 weeks? (e.g., antibiotics)      Was taking prednisone  prescribed in ED last month but since discontinuing it the itchiness has increased.   10. OTHER SYMPTOMS: Do you have any other symptoms? (e.g., dizziness, headache, sore throat, joint pain)  Cold, wet cloth relieves itching. Derm appt on 04/03/24.  Seeking blood work with Dr Charlett.  Changed food, detergent, clothes  Protocols used: Rash or Redness - [facility]

## 2024-03-07 NOTE — Telephone Encounter (Signed)
 Pt has appt tomorrow

## 2024-03-07 NOTE — Telephone Encounter (Unsigned)
 Copied from CRM [id number]. Topic: Clinical - Request for Lab/Test Order >> Mar 07, 2024 11:45 AM [name] wrote: Reason for CRM: Underlying itching issue at crucial stage. Daughter took patient to [facility]. They have an appt on February 2nd with [facility] [place].   Patient is in excruiating pain and itching with this condition. They are wondering if Dr. Charlett can run labs to see if this is a blood issue?   [phone] (M)

## 2024-03-08 ENCOUNTER — Ambulatory Visit (INDEPENDENT_AMBULATORY_CARE_PROVIDER_SITE_OTHER): Admitting: Internal Medicine

## 2024-03-08 ENCOUNTER — Encounter: Payer: Self-pay | Admitting: Internal Medicine

## 2024-03-08 VITALS — BP 128/94 | HR 78 | Temp 98.0°F | Ht 64.0 in | Wt 167.4 lb

## 2024-03-08 DIAGNOSIS — R739 Hyperglycemia, unspecified: Secondary | ICD-10-CM

## 2024-03-08 DIAGNOSIS — T887XXA Unspecified adverse effect of drug or medicament, initial encounter: Secondary | ICD-10-CM

## 2024-03-08 DIAGNOSIS — L308 Other specified dermatitis: Secondary | ICD-10-CM

## 2024-03-08 DIAGNOSIS — D582 Other hemoglobinopathies: Secondary | ICD-10-CM

## 2024-03-08 MED ORDER — FLUOCINONIDE 0.05 % EX CREA: 1.0000 | TOPICAL_CREAM | Freq: Two times a day (BID) | CUTANEOUS | 1 refills | Status: AC

## 2024-03-08 NOTE — Patient Instructions (Addendum)
 Dermatology appt   as planned.  Try  to stop scratching ( makes it worse) .    Lab today . Continue hydration . May try a  topical  locally stronger steroid in short run  Continue moisturizing ordering .   Update lab today .    Can try zyrtec to see if takes the edge off.

## 2024-03-08 NOTE — Progress Notes (Signed)
 "  Chief Complaint  Patient presents with   Rash    Pt is with daughter, Heron. Pt c/o ongoing rash/itchy. Went to ED. Put on prednisone . After finish, rash has gotten worse. Has referral with Dermatology on 2/7. Pt reports it is like ants bite on legs, arms more at night.  Daughter reports is it not on the topical, more underneath skin. Would like to run blood.     HPI: Julie Boone 89 y.o. come in w daughter  for ongoing itchy arms distal legs see last visit.  Patient had canceled the dermatology appointment we had arranged.  Since then she scratches much at night hard for her to sleep does not think the hydroxyzine  helped no longer taking it.  Different topicals have not been helpful is using Aquaphor for hydration.  Got so bad she went to the ED they gave her a shot of steroids which caused some significant side effects with hyperphasia mood changes activity and side effects she would not like to have again.  There is some question of certain foods making it worse.  But no history of hives.  No respiratory symptoms.  She has an appointment with [facility] the beginning of February and May hopefully get in earlier if  Available. ROS: See pertinent positives and negatives per HPI. ED visit reviewed  Past Medical History:  Diagnosis Date   Acute sinusitis with symptoms greater than 10 days 06/23/2013   Arthritis    Breast cancer (HCC)    mastectomy 3   neg ln    HOH (hard of hearing)    HTN (hypertension)    Wears glasses    Wears hearing aid    both ears    Family History  Problem Relation Age of Onset   Hypertension Mother    Stroke Mother    Heart failure Mother    Lung disease Sister        smoker   Colon cancer Father        dx in his late 25's   Esophageal cancer Neg Hx    Inflammatory bowel disease Neg Hx    Liver disease Neg Hx    Pancreatic cancer Neg Hx    Stomach cancer Neg Hx     Social History   Socioeconomic History   Marital status:  Widowed    Spouse name: Not on file   Number of children: 1   Years of education: 12   Highest education level: High school graduate  Occupational History    Comment: retired  Tobacco Use   Smoking status: Never   Smokeless tobacco: Never  Vaping Use   Vaping status: Never Used  Substance and Sexual Activity   Alcohol use: Yes    Alcohol/week: 0.0 standard drinks of alcohol    Comment: 1 time a month. has a sip ever now and then    Drug use: No   Sexual activity: Not on file  Other Topics Concern   Not on file  Social History Narrative   2 people living in the home   Dog Small yorkie and [place]iletz   From [place] moved from [place]   Adopted daughter nearby.      Neg ets  Wine with meals  No tob rd.   G0P0   Married husband italian background  Herself ir[place] descent.   Husband passed CHF and prostate cancer 2017   Now living with daughter         Social Drivers of  Health   Tobacco Use: Low Risk (03/08/2024)   Patient History    Smoking Tobacco Use: Never    Smokeless Tobacco Use: Never    Passive Exposure: Not on file  Financial Resource Strain: Low Risk (08/11/2023)   Overall Financial Resource Strain (CARDIA)    Difficulty of Paying Living Expenses: Not hard at all  Food Insecurity: No Food Insecurity (08/11/2023)   Hunger Vital Sign    Worried About Running Out of Food in the Last Year: Never true    Ran Out of Food in the Last Year: Never true  Transportation Needs: No Transportation Needs (08/11/2023)   PRAPARE - Administrator, Civil Service (Medical): No    Lack of Transportation (Non-Medical): No  Physical Activity: Inactive (08/11/2023)   Exercise Vital Sign    Days of Exercise per Week: 0 days    Minutes of Exercise per Session: 0 min  Stress: No Stress Concern Present (08/11/2023)   [employer] of Occupational Health - Occupational Stress Questionnaire    Feeling of Stress : Not at all  Social Connections: Moderately Integrated (08/11/2023)    Social Connection and Isolation Panel    Frequency of Communication with Friends and Family: More than three times a week    Frequency of Social Gatherings with Friends and Family: More than three times a week    Attends Religious Services: More than 4 times per year    Active Member of [employer] or Organizations: Yes    Attends Banker Meetings: More than 4 times per year    Marital Status: Widowed  Depression (PHQ2-9): Low Risk (08/11/2023)   Depression (PHQ2-9)    PHQ-2 Score: 0  Alcohol Screen: Low Risk (08/11/2023)   Alcohol Screen    Last Alcohol Screening Score (AUDIT): 2  Housing: Unknown (08/11/2023)   Housing Stability Vital Sign    Unable to Pay for Housing in the Last Year: No    Number of Times Moved in the Last Year: Not on file    Homeless in the Last Year: No  Utilities: Not At Risk (08/11/2023)   AHC Utilities    Threatened with loss of utilities: No  Health Literacy: Adequate Health Literacy (08/11/2023)   B1300 Health Literacy    Frequency of need for help with medical instructions: Never    Outpatient Medications Prior to Visit  Medication Sig Dispense Refill   apixaban  (ELIQUIS ) 5 MG TABS tablet Take 1 tablet (5 mg total) by mouth 2 (two) times daily. 180 tablet 3   atenolol  (TENORMIN ) 25 MG tablet Take 1 tablet (25 mg total) by mouth 2 (two) times daily. (Patient taking differently: Take 25 mg by mouth 2 (two) times daily. Pt reports she is taking every other day.) 180 tablet 3   meclizine  (ANTIVERT ) 25 MG tablet 1/2 to 1  po up to every 8 hours as needed for vertigo  Fall precautions 20 tablet 0   atenolol  (TENORMIN ) 50 MG tablet Take 1.5 tablets (75 mg total) by mouth daily. (Patient not taking: Reported on 03/08/2024) 135 tablet 0   hydrOXYzine  (ATARAX ) 10 MG tablet Take 1-2 tablets (10-20 mg total) by mouth 3 (three) times daily as needed for itching (at night). (Patient not taking: Reported on 03/08/2024) 30 tablet 3   omeprazole  (PRILOSEC) 40 MG capsule  TAKE 1 CAPSULE BY MOUTH DAILY (Patient not taking: Reported on 03/08/2024) 60 capsule 0   ondansetron  (ZOFRAN -ODT) 4 MG disintegrating tablet Take 1 tablet (4 mg total)  by mouth every 8 (eight) hours as needed for nausea or vomiting. (Patient not taking: Reported on 03/08/2024) 20 tablet 0   predniSONE  (STERAPRED UNI-PAK 21 TAB) 10 MG (21) TBPK tablet Take by mouth daily. Take 6 tabs by mouth daily  for 2 days, then 5 tabs for 2 days, then 4 tabs for 2 days, then 3 tabs for 2 days, 2 tabs for 2 days, then 1 tab by mouth daily for 2 days 42 tablet 0   triamcinolone  cream (KENALOG ) 0.1 % Apply 1 Application topically 2 (two) times daily. As directed (Patient not taking: Reported on 03/08/2024) 45 g 1   UNABLE TO FIND Med Name: [place] leg cramps ho meo pathix med  Takes as needed for leg cramps says it works well (Patient not taking: Reported on 03/08/2024)     No facility-administered medications prior to visit.     EXAM:  BP (!) 128/94 (BP Location: Right Arm, Patient Position: Sitting, Cuff Size: Large)   Pulse 78   Temp 98 F (36.7 C) (Oral)   Ht 5' 4 (1.626 m)   Wt 167 lb 6.4 oz (75.9 kg)   SpO2 95%   BMI 28.73 kg/m   Body mass index is 28.73 kg/m.  GENERAL: vitals reviewed and listed above, alert, oriented, appears well hydrated and in no acute distress HEENT: atraumatic, conjunctiva  clear, no obvious abnormalities on inspection of external nose and ears  NECK: no obvious masses on inspection palpation  LUNGS: clear to auscultation bilaterally, no wheezes, rales or rhonchi, good air movement CV: HRRR, no clubbing cyanosis or    MS: moves all extremities without noticeable focal  abnormality Skin shows patchy erythema some lichenified in the antecubital area both arms and distal legs.  No vesicles or other lesions rest of skin with no unusual changes.  No burrows papular or pustules. PSYCH: pleasant and cooperative, no obvious depression or anxiety Lab Results  Component Value Date    WBC 10.0 06/10/2023   HGB 16.0 (H) 06/10/2023   HCT 48.2 (H) 06/10/2023   PLT 302.0 06/10/2023   GLUCOSE 158 (H) 06/10/2023   CHOL 187 05/21/2020   TRIG 176.0 (H) 05/21/2020   HDL 35.30 (L) 05/21/2020   LDLDIRECT 103.0 07/10/2019   LDLCALC 117 (H) 05/21/2020   ALT 18 06/10/2023   AST 23 06/10/2023   NA 137 06/10/2023   K 4.3 06/10/2023   CL 103 06/10/2023   CREATININE 0.89 06/10/2023   BUN 20 06/10/2023   CO2 22 06/10/2023   TSH 2.37 06/10/2023   INR 0.93 04/01/2018   HGBA1C 6.8 (H) 06/10/2023   BP Readings from Last 3 Encounters:  03/08/24 (!) 128/94  02/08/24 (!) 145/104  09/24/23 (!) 150/96    ASSESSMENT AND PLAN:  Discussed the following assessment and plan:  Pruritic dermatitis - Plan: JAK2 V617F rfx CALR/MPL/E12-15, Erythropoietin, CBC with Differential/Platelet, Hemoglobin A1c  Hyperglycemia - Plan: JAK2 V617F rfx CALR/MPL/E12-15, Erythropoietin, CBC with Differential/Platelet, Hemoglobin A1c  Elevated hemoglobin - Plan: JAK2 V617F rfx CALR/MPL/E12-15, Erythropoietin, CBC with Differential/Platelet, Hemoglobin A1c  Medication side effect Never got the follow-up lab work from last time she does have an elevated hemoglobin although I doubt it is related to her problem and no evidence of liver disease.  Follow-up lab work today Distribution on her arms is antecubital and around elbows. Discussed scratch itch scratch cycle again can try in a small area higher  potency Lidex  steroid temporarily Consider Zyrtec instead of Antivert  which she is used to  help her sleep Dermatology consult as planned She had side effects that were negative of injectable steroids reviewed if using in the future would use very low-dose and short-term. -Patient advised to return or notify health care team  if  new concerns arise.  Patient Instructions  Dermatology appt   as planned.  Try  to stop scratching ( makes it worse) .    Lab today . Continue hydration . May try a  topical   locally stronger steroid in short run  Continue moisturizing ordering .   Update lab today .    Can try zyrtec to see if takes the edge off.     Jiali Linney K. Leonid Manus M.D. "

## 2024-03-09 LAB — CBC WITH DIFFERENTIAL/PLATELET
Basophils Absolute: 0.1 K/uL (ref 0.0–0.1)
Basophils Relative: 1.1 % (ref 0.0–3.0)
Eosinophils Absolute: 0.3 K/uL (ref 0.0–0.7)
Eosinophils Relative: 3.6 % (ref 0.0–5.0)
HCT: 47.8 % — ABNORMAL HIGH (ref 36.0–46.0)
Hemoglobin: 15.9 g/dL — ABNORMAL HIGH (ref 12.0–15.0)
Lymphocytes Relative: 19.8 % (ref 12.0–46.0)
Lymphs Abs: 1.8 K/uL (ref 0.7–4.0)
MCHC: 33.3 g/dL (ref 30.0–36.0)
MCV: 96.1 fl (ref 78.0–100.0)
Monocytes Absolute: 1 K/uL (ref 0.1–1.0)
Monocytes Relative: 10.8 % (ref 3.0–12.0)
Neutro Abs: 6 K/uL (ref 1.4–7.7)
Neutrophils Relative %: 64.7 % (ref 43.0–77.0)
Platelets: 412 K/uL — ABNORMAL HIGH (ref 150.0–400.0)
RBC: 4.97 Mil/uL (ref 3.87–5.11)
RDW: 13.1 % (ref 11.5–15.5)
WBC: 9.2 K/uL (ref 4.0–10.5)

## 2024-03-09 LAB — JAK2 V617F RFX CALR/MPL/E12-15

## 2024-03-09 LAB — HEMOGLOBIN A1C: Hgb A1c MFr Bld: 8.2 % — ABNORMAL HIGH (ref 4.6–6.5)

## 2024-03-10 LAB — ERYTHROPOIETIN: Erythropoietin: 6 m[IU]/mL (ref 2.6–18.5)

## 2024-03-13 ENCOUNTER — Ambulatory Visit: Payer: Self-pay | Admitting: Internal Medicine

## 2024-03-13 NOTE — Progress Notes (Signed)
 So lab so far   show  a 1c very up and in diabetic range . SABRA Don't  think the steroid you had would have raised the level this much.   We can add medication but  advise  much better control of sugar and simple carb intake that may  get this in control .   Can begin taking cbg levels  and send in glucometer covered  but her insurance and  check cbg twice a day for 2 weeks and then as   indicated to monitor . Plan fu visit virtual ok in about  4-6 weeks  to review    and decide if adding med is indicated

## 2024-03-18 LAB — JAK2 V617F RFX CALR/MPL/E12-15

## 2024-03-18 LAB — CALR +MPL + E12-E15  (REFLEX)

## 2024-03-22 NOTE — Progress Notes (Signed)
 Noted ok.

## 2024-05-01 ENCOUNTER — Ambulatory Visit (INDEPENDENT_AMBULATORY_CARE_PROVIDER_SITE_OTHER): Admitting: Otolaryngology

## 2024-08-16 ENCOUNTER — Ambulatory Visit
